# Patient Record
Sex: Female | Born: 1995 | Race: White | Hispanic: No | Smoking: Former smoker
Health system: Southern US, Community
[De-identification: ages and names within clinical notes are randomized; demographics above are authoritative.]

## PROBLEM LIST (undated history)

## (undated) DIAGNOSIS — F419 Anxiety disorder, unspecified: Secondary | ICD-10-CM

## (undated) DIAGNOSIS — Z789 Other specified health status: Secondary | ICD-10-CM

## (undated) DIAGNOSIS — G932 Benign intracranial hypertension: Secondary | ICD-10-CM

## (undated) HISTORY — DX: Other specified health status: Z78.9

## (undated) HISTORY — DX: Benign intracranial hypertension: G93.2

---
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
## 2013-03-09 ENCOUNTER — Emergency Department: Payer: Self-pay | Admitting: Emergency Medicine

## 2013-03-09 LAB — COMPREHENSIVE METABOLIC PANEL
Albumin: 3.8 g/dL (ref 3.8–5.6)
Anion Gap: 5 — ABNORMAL LOW (ref 7–16)
BUN: 10 mg/dL (ref 9–21)
Bilirubin,Total: 0.4 mg/dL (ref 0.2–1.0)
Calcium, Total: 8.9 mg/dL — ABNORMAL LOW (ref 9.0–10.7)
Co2: 27 mmol/L — ABNORMAL HIGH (ref 16–25)
Creatinine: 0.66 mg/dL (ref 0.60–1.30)
EGFR (Non-African Amer.): >60
Potassium: 3.9 mmol/L (ref 3.3–4.7)
SGOT(AST): 25 U/L (ref 0–26)
SGPT (ALT): 31 U/L (ref 12–78)
Sodium: 139 mmol/L (ref 132–141)

## 2013-03-09 LAB — CBC
HCT: 38.5 % (ref 35.0–47.0)
HGB: 13.3 g/dL (ref 12.0–16.0)
MCHC: 34.5 g/dL (ref 32.0–36.0)
Platelet: 220 10*3/uL (ref 150–440)
RBC: 5 10*6/uL (ref 3.80–5.20)

## 2013-03-09 LAB — URINALYSIS, COMPLETE
Bilirubin,UR: NEGATIVE
Glucose,UR: NEGATIVE mg/dL (ref 0–75)
Nitrite: NEGATIVE
Ph: 6 (ref 4.5–8.0)
Protein: 100
RBC,UR: 108 /HPF (ref 0–5)
Specific Gravity: 1.02 (ref 1.003–1.030)

## 2013-03-09 LAB — PREGNANCY, URINE: Pregnancy Test, Urine: NEGATIVE m[IU]/mL

## 2013-03-11 LAB — URINE CULTURE

## 2013-03-16 ENCOUNTER — Emergency Department: Payer: Self-pay | Admitting: Emergency Medicine

## 2013-03-28 ENCOUNTER — Emergency Department: Payer: Self-pay | Admitting: Emergency Medicine

## 2014-02-17 ENCOUNTER — Emergency Department: Payer: Self-pay | Admitting: Emergency Medicine

## 2014-02-17 LAB — URINALYSIS, COMPLETE
BILIRUBIN, UR: NEGATIVE
BLOOD: NEGATIVE
Bacteria: NONE SEEN
GLUCOSE, UR: NEGATIVE mg/dL (ref 0–75)
Ketone: NEGATIVE
Leukocyte Esterase: NEGATIVE
NITRITE: NEGATIVE
Ph: 5 (ref 4.5–8.0)
Protein: 30
SPECIFIC GRAVITY: 1.032 (ref 1.003–1.030)
Squamous Epithelial: 6
WBC UR: 2 /HPF (ref 0–5)

## 2014-02-17 LAB — CBC
HCT: 41.1 % (ref 35.0–47.0)
HGB: 13.8 g/dL (ref 12.0–16.0)
MCH: 27.4 pg (ref 26.0–34.0)
MCHC: 33.6 g/dL (ref 32.0–36.0)
MCV: 81 fL (ref 80–100)
PLATELETS: 168 10*3/uL (ref 150–440)
RBC: 5.04 10*6/uL (ref 3.80–5.20)
RDW: 13.5 % (ref 11.5–14.5)
WBC: 11.9 10*3/uL — AB (ref 3.6–11.0)

## 2014-02-17 LAB — COMPREHENSIVE METABOLIC PANEL
ALK PHOS: 74 U/L
ALT: 13 U/L (ref 12–78)
ANION GAP: 11 (ref 7–16)
Albumin: 3.4 g/dL — ABNORMAL LOW (ref 3.8–5.6)
BUN: 5 mg/dL — ABNORMAL LOW (ref 7–18)
Bilirubin,Total: 0.3 mg/dL (ref 0.2–1.0)
CALCIUM: 9 mg/dL (ref 9.0–10.7)
CHLORIDE: 104 mmol/L (ref 98–107)
CO2: 20 mmol/L — AB (ref 21–32)
Creatinine: 0.57 mg/dL — ABNORMAL LOW (ref 0.60–1.30)
EGFR (African American): >60
EGFR (Non-African Amer.): >60
Glucose: 66 mg/dL (ref 65–99)
OSMOLALITY: 266 (ref 275–301)
Potassium: 3.7 mmol/L (ref 3.5–5.1)
SGOT(AST): 21 U/L (ref 0–26)
Sodium: 135 mmol/L — ABNORMAL LOW (ref 136–145)
Total Protein: 7.6 g/dL (ref 6.4–8.6)

## 2014-02-17 LAB — HCG, QUANTITATIVE, PREGNANCY: Beta Hcg, Quant.: 113817 m[IU]/mL — ABNORMAL HIGH

## 2014-02-18 LAB — WET PREP, GENITAL

## 2014-02-18 LAB — GC/CHLAMYDIA PROBE AMP

## 2014-04-11 ENCOUNTER — Emergency Department: Payer: Self-pay | Admitting: Student

## 2014-04-11 LAB — URINALYSIS, COMPLETE
Bacteria: NONE SEEN
Bilirubin,UR: NEGATIVE
Blood: NEGATIVE
Glucose,UR: NEGATIVE mg/dL (ref 0–75)
KETONE: NEGATIVE
LEUKOCYTE ESTERASE: NEGATIVE
Nitrite: NEGATIVE
PH: 7 (ref 4.5–8.0)
Protein: NEGATIVE
RBC,UR: <1 /HPF (ref 0–5)
SPECIFIC GRAVITY: 1.024 (ref 1.003–1.030)
Squamous Epithelial: 4
WBC UR: <1 /HPF (ref 0–5)

## 2014-04-11 LAB — LIPASE, BLOOD: Lipase: 69 U/L — ABNORMAL LOW (ref 73–393)

## 2014-04-11 LAB — CBC WITH DIFFERENTIAL/PLATELET
BASOS PCT: 0.2 %
Basophil #: 0 10*3/uL (ref 0.0–0.1)
EOS PCT: 1.3 %
Eosinophil #: 0.2 10*3/uL (ref 0.0–0.7)
HCT: 38.9 % (ref 35.0–47.0)
HGB: 13.2 g/dL (ref 12.0–16.0)
LYMPHS ABS: 1.1 10*3/uL (ref 1.0–3.6)
Lymphocyte %: 8 %
MCH: 28.6 pg (ref 26.0–34.0)
MCHC: 34.1 g/dL (ref 32.0–36.0)
MCV: 84 fL (ref 80–100)
MONO ABS: 0.7 x10 3/mm (ref 0.2–0.9)
MONOS PCT: 5.1 %
Neutrophil #: 12.2 10*3/uL — ABNORMAL HIGH (ref 1.4–6.5)
Neutrophil %: 85.4 %
Platelet: 156 10*3/uL (ref 150–440)
RBC: 4.64 10*6/uL (ref 3.80–5.20)
RDW: 13.5 % (ref 11.5–14.5)
WBC: 14.2 10*3/uL — AB (ref 3.6–11.0)

## 2014-04-11 LAB — COMPREHENSIVE METABOLIC PANEL
ANION GAP: 8 (ref 7–16)
Albumin: 2.6 g/dL — ABNORMAL LOW (ref 3.4–5.0)
Alkaline Phosphatase: 82 U/L
BUN: 5 mg/dL — AB (ref 7–18)
Bilirubin,Total: 0.4 mg/dL (ref 0.2–1.0)
CALCIUM: 8.6 mg/dL (ref 8.5–10.1)
CREATININE: 0.54 mg/dL — AB (ref 0.60–1.30)
Chloride: 106 mmol/L (ref 98–107)
Co2: 23 mmol/L (ref 21–32)
EGFR (Non-African Amer.): >60
GLUCOSE: 87 mg/dL (ref 65–99)
OSMOLALITY: 270 (ref 275–301)
POTASSIUM: 3.7 mmol/L (ref 3.5–5.1)
SGOT(AST): 17 U/L (ref 15–37)
SGPT (ALT): 10 U/L — ABNORMAL LOW
SODIUM: 137 mmol/L (ref 136–145)
TOTAL PROTEIN: 7 g/dL (ref 6.4–8.2)

## 2014-04-11 LAB — HCG, QUANTITATIVE, PREGNANCY: BETA HCG, QUANT.: 20441 m[IU]/mL — AB

## 2014-06-17 ENCOUNTER — Observation Stay: Payer: Self-pay | Admitting: Obstetrics and Gynecology

## 2014-06-17 LAB — PRENATAL PANEL
ABO/RH(D): O POS
Antibody Screen: NEGATIVE
GLUCOSE: 114 mg/dL — AB (ref 65–99)
HCT: 33.2 % — ABNORMAL LOW (ref 35.0–47.0)
HGB: 11.2 g/dL — AB (ref 12.0–16.0)
MCH: 28.7 pg (ref 26.0–34.0)
MCHC: 33.9 g/dL (ref 32.0–36.0)
MCV: 85 fL (ref 80–100)
Platelet: 166 10*3/uL (ref 150–440)
RBC: 3.91 10*6/uL (ref 3.80–5.20)
RDW: 13.1 % (ref 11.5–14.5)
WBC: 15.5 10*3/uL — AB (ref 3.6–11.0)

## 2014-06-17 LAB — URINALYSIS, COMPLETE
Bilirubin,UR: NEGATIVE
Blood: NEGATIVE
Glucose,UR: NEGATIVE mg/dL (ref 0–75)
KETONE: NEGATIVE
NITRITE: NEGATIVE
Ph: 5 (ref 4.5–8.0)
Protein: 100
Specific Gravity: 1.024 (ref 1.003–1.030)
Squamous Epithelial: 3
WBC UR: 74 /HPF (ref 0–5)

## 2014-06-19 LAB — URINE CULTURE

## 2014-07-05 ENCOUNTER — Encounter: Payer: Self-pay | Admitting: Maternal & Fetal Medicine

## 2014-07-16 ENCOUNTER — Encounter: Payer: Self-pay | Admitting: Obstetrics and Gynecology

## 2014-08-20 ENCOUNTER — Observation Stay: Payer: Self-pay | Admitting: Obstetrics and Gynecology

## 2014-08-28 ENCOUNTER — Observation Stay: Payer: Self-pay | Admitting: Obstetrics and Gynecology

## 2014-08-29 ENCOUNTER — Observation Stay: Payer: Self-pay | Admitting: Obstetrics and Gynecology

## 2014-09-04 ENCOUNTER — Observation Stay: Payer: Self-pay | Admitting: Obstetrics and Gynecology

## 2014-09-14 ENCOUNTER — Observation Stay: Payer: Self-pay | Admitting: Obstetrics and Gynecology

## 2014-09-15 ENCOUNTER — Inpatient Hospital Stay: Payer: Self-pay | Admitting: Obstetrics and Gynecology

## 2014-09-15 LAB — CBC WITH DIFFERENTIAL/PLATELET
Basophil #: 0 10*3/uL (ref 0.0–0.1)
Basophil %: 0.2 %
EOS PCT: 0.5 %
Eosinophil #: 0.1 10*3/uL (ref 0.0–0.7)
HCT: 33 % — ABNORMAL LOW (ref 35.0–47.0)
HGB: 10.8 g/dL — ABNORMAL LOW (ref 12.0–16.0)
LYMPHS ABS: 1.6 10*3/uL (ref 1.0–3.6)
Lymphocyte %: 11.4 %
MCH: 25.6 pg — AB (ref 26.0–34.0)
MCHC: 32.8 g/dL (ref 32.0–36.0)
MCV: 78 fL — AB (ref 80–100)
MONO ABS: 0.9 x10 3/mm (ref 0.2–0.9)
Monocyte %: 6.5 %
Neutrophil #: 11.4 10*3/uL — ABNORMAL HIGH (ref 1.4–6.5)
Neutrophil %: 81.4 %
Platelet: 177 10*3/uL (ref 150–440)
RBC: 4.21 10*6/uL (ref 3.80–5.20)
RDW: 14.6 % — ABNORMAL HIGH (ref 11.5–14.5)
WBC: 14.1 10*3/uL — ABNORMAL HIGH (ref 3.6–11.0)

## 2014-09-16 LAB — TROPONIN I: Troponin-I: <0.02 ng/mL

## 2014-09-16 LAB — CBC WITH DIFFERENTIAL/PLATELET
Basophil #: 0 10*3/uL (ref 0.0–0.1)
Basophil %: 0.2 %
EOS ABS: 0 10*3/uL (ref 0.0–0.7)
Eosinophil %: 0.2 %
HCT: 30.9 % — ABNORMAL LOW (ref 35.0–47.0)
HGB: 10.3 g/dL — ABNORMAL LOW (ref 12.0–16.0)
Lymphocyte #: 1.5 10*3/uL (ref 1.0–3.6)
Lymphocyte %: 11.1 %
MCH: 26 pg (ref 26.0–34.0)
MCHC: 33.2 g/dL (ref 32.0–36.0)
MCV: 78 fL — AB (ref 80–100)
MONO ABS: 1.2 x10 3/mm — AB (ref 0.2–0.9)
Monocyte %: 8.7 %
NEUTROS PCT: 79.8 %
Neutrophil #: 10.7 10*3/uL — ABNORMAL HIGH (ref 1.4–6.5)
PLATELETS: 149 10*3/uL — AB (ref 150–440)
RBC: 3.94 10*6/uL (ref 3.80–5.20)
RDW: 14.5 % (ref 11.5–14.5)
WBC: 13.5 10*3/uL — ABNORMAL HIGH (ref 3.6–11.0)

## 2014-09-16 LAB — BASIC METABOLIC PANEL
ANION GAP: 11 (ref 7–16)
BUN: 6 mg/dL — ABNORMAL LOW (ref 7–18)
CALCIUM: 8.2 mg/dL — AB (ref 8.5–10.1)
CHLORIDE: 109 mmol/L — AB (ref 98–107)
CO2: 22 mmol/L (ref 21–32)
Creatinine: 0.6 mg/dL (ref 0.60–1.30)
EGFR (African American): >60
EGFR (Non-African Amer.): >60
Glucose: 113 mg/dL — ABNORMAL HIGH (ref 65–99)
OSMOLALITY: 282 (ref 275–301)
Potassium: 3.7 mmol/L (ref 3.5–5.1)
Sodium: 142 mmol/L (ref 136–145)

## 2014-09-16 LAB — CK TOTAL AND CKMB (NOT AT ARMC)
CK, TOTAL: 90 U/L (ref 26–192)
CK, TOTAL: 75 U/L (ref 26–192)
CK-MB: 2.2 ng/mL (ref 0.5–3.6)
CK-MB: 2.4 ng/mL (ref 0.5–3.6)

## 2014-09-16 LAB — HEMATOCRIT: HCT: 29.5 % — ABNORMAL LOW (ref 35.0–47.0)

## 2014-11-09 ENCOUNTER — Emergency Department: Payer: Self-pay | Admitting: Emergency Medicine

## 2014-12-18 NOTE — H&P (Signed)
PATIENT NAME:  Terri [name], CARTAYA MR#:  [id number] DATE OF BIRTH:  June 26, 1996  DATE OF ADMISSION:  07/08/2012  REFERRING PHYSICIAN: ER physician, Dr. Mayford Knife. PRIMARY CARE PHYSICIAN: None. OB: Dr. Darden Dates in [place].   CHIEF COMPLAINT: Left-sided flank pain, back pain.  HISTORY OF PRESENT ILLNESS: The patient is an 19 year old female with no past medical history who is 16 weeks 5 days pregnant. She has just moved to [place] from [place]. She reported some cramping in her abdomen and left-sided back/flank pain and came to the ER for evaluation. She denies any dysuria but she reports that she is not peeing very well and is having urinary frequency. She denies any fever. On further evaluation she was found to have possible pyelonephritis and urinary tract infection with pyuria. She is being admitted for further management.   ALLERGIES: No known drug allergies.   MEDICATIONS: Prenatal vitamins.   PAST MEDICAL HISTORY: None.   PAST SURGICAL HISTORY: None.   FAMILY HISTORY: Mother has arthritis, anxiety. Patient does not know of any medical problems; patient does not know her father well.    SOCIAL HISTORY: She lives with her boyfriend. Denies any history of smoking, alcohol, or drugs. She just moved last week from [place] to [place].   REVIEW OF SYSTEMS. CONSTITUTIONAL: Denies any fever, fatigue, weakness. EYES: Denies any blurred or double vision. ENT: Denies any tinnitus, ear pain. RESPIRATORY: Denies any cough, wheezing.  CARDIOVASCULAR: Denies any chest pain or palpitations.  GI: Denies any nausea, vomiting, diarrhea, abdominal pain. GU: Reports frequency and left-sided flank pain. ENDOCRINE: Denies any polyuria or nocturia. HEME/LYMPH: Denies any anemia or easy bruisability.  INTEGUMENTARY: Denies any acne or rash. MUSCULOSKELETAL: Denies any swelling, gout. NEUROLOGICAL: Denies any numbness or weakness. PSYCHIATRIC: Denies any anxiety or depression.    PHYSICAL EXAMINATION:  VITAL SIGNS: Temperature 98.2, heart rate 101, respiratory rate 24, blood pressure 159/91, pulse ox 100% on room air.   GENERAL: The patient is a young Caucasian female lying comfortably in bed, not in acute distress.   HEAD: Atraumatic, normocephalic.   EYES: No pallor, icterus, or cyanosis. PERLA, EOMI.    ENT: Wet mucous membranes. No oropharyngeal erythema or thrush.   NECK: Supple. No masses, no JVD, no thyromegaly.   CHEST: No tenderness to palpation. Not using accessory muscles of respiration. No intercostal muscle retractions.   LUNGS: Bilaterally clear to auscultation. No wheezing, rales or rhonchi.   CARDIOVASCULAR: S1, S2 regular. No murmur, rubs, or gallops.   ABDOMEN: Soft, nondistended, nondistended. No guarding or rigidity. Normal bowel sounds. The patient has some left-sided flank tenderness. She also reports some right-sided tenderness on deep palpation.   SKIN: Patient has some acne. No rashes or lesions.   PERIPHERIES: No pedal edema, 2+ pedal pulses.   MUSCULOSKELETAL: No cyanosis or clubbing.   NEUROLOGICAL: Awake, alert, oriented x3. Nonfocal neurological exam. Cranial nerves grossly intact.   PSYCH: Normal mood and affect.   RESULTS: Renal ultrasound shows normal renal ultrasound. OB ultrasound showed single live intrauterine pregnancy 16 weeks 5 days. Cervix was abnormally long at 2.6. Urinalysis shows evidence of infection with pyuria. White count of 17.1, hemoglobin 13.4, hematocrit 38.1, platelet count 183,000. Glucose 80, BUN 2, creatinine 0.37, sodium 137, potassium 3.7, chloride 104, CO2 of 23, calcium 9, bilirubin 0.5, ALP 97, ALT 12, AST 9.   ASSESSMENT AND PLAN: An 19 year old female with no past medical history who is [redacted] weeks pregnant and just moved to [place]  who comes with left-sided flank pain, back pain, and difficulty urinating.  1. Urinary tract infection/pyelonephritis. The patient has pyuria with evidence  of urinary tract infection. Her kidney ultrasound is normal. We will admit the patient to the hospital, start her on IV fluids, obtain blood and urine cultures, and give her IV Rocephin.  2. Leukocytosis possibly due to above. We will repeat a CBC in the a.m.  3. Sixteen weeks 5 days intrauterine pregnancy. The OB ultrasound confirms single live intrauterine pregnancy. The patient's [place] is in [place].  [place] [facility]. We will obtain OB consult since [facility] .  Discussed with the ED physician, discussed with the patient the plan of care and management.   TIME SPENT: 75 minutes.     ____________________________ Darrick MeigsSangeeta Bertel Venard, MD sp:vtd D: 07/08/2012 19:58:42 ET T: 07/09/2012 06:10:26 ET JOB#: [id number]  cc: Darrick MeigsSangeeta Abbagale Goguen, MD, <Dictator> Darrick MeigsSANGEETA Adilenne Ashworth MD ELECTRONICALLY SIGNED 07/12/2012 14:37

## 2014-12-18 NOTE — Discharge Summary (Signed)
PATIENT NAME:  Terri [name], Terri [name] MR#:  [id number] DATE OF BIRTH:  Mar 25, 1996  DATE OF ADMISSION:  07/08/2012 DATE OF DISCHARGE:  07/10/2012  PRESENTING COMPLAINT: Left-sided flank pain and back pain.   DISCHARGE DIAGNOSES:  1. Acute left-sided pyelonephritis/urinary tract infection, Escherichia coli.    2. [redacted] weeks pregnant.   DISCHARGE MEDICATIONS:  1. Prenatal vitamins p.o. daily.  2. Macrobid one p.o. b.i.d. for five days.   FOLLOWUP: Follow up with [facility] in one week for prenatal followup.   LABORATORY DATA:  White count was 12.1, hemoglobin and hematocrit 10.8 and 30.7, platelet count 145. Blood cultures negative in 36 hours. Ultrasound kidneys bilateral: Normal. Urinalysis positive for urinary tract infection. Urine culture Escherichia coli. White count on admission was 17.1.  CONSULTANTS: OB/GYN Dr. Luella Cookosenow.    BRIEF SUMMARY OF HOSPITAL COURSE: [name] is an 19 year old Caucasian female with no past medical history who is [redacted] weeks pregnant, [place], comes in with left-sided flank pain and back pain and difficulty urinating. She was admitted with:  1. Acute left-sided pyelonephritis/urinary tract infection. She was started on IV Rocephin. Her antibiotics were changed to p.o. Macrobid, scrip was given by Dr. Luella Cookosenow.  Blood cultures were negative. Urine culture grew Escherichia coli.  2. Leukocytosis due to #1. Improved. No fever.  3. 16-week intrauterine pregnancy. OB ultrasound confirmed single live intrauterine pregnancy. The patient's [place] is in [place]. [place] [facility] [place] [facility]. [facility] in one week.  4. Hospital stay otherwise remained stable.  5. CODE STATUS: The patient remained [name].   TIME SPENT: 40 minutes.     ____________________________ [name]. Allena KatzPatel, MD sap:bjt D: 07/12/2012 00:59:05 ET T: 07/12/2012 13:08:33 ET JOB#: [id number]  cc: [name]. Allena KatzPatel, MD, <Dictator> Willow OraSONA [name]  Annali Lybrand MD ELECTRONICALLY SIGNED 07/18/2012 10:24

## 2014-12-22 NOTE — Consult Note (Signed)
Referral Information:  Reason for Referral Terri [name] returns to discuss her pregnancy and her h/o "autoimmune disease" and chronic knee pain, She also has concerns about the  birth process given problems last time   Referring Physician [facility] - Dr Valentino Saxonherry   Prenatal Hx 19 yo G2 p0101 WF at approximately [redacted] weeks gestation ( EDD of 09/09/2014 based on 10 weeks [facility] ) Pt presented late for care in mid [place].  She has a h/o "lupus" diagnosed 4-5 years ago with knee pain and positive ANA . Pt reports ongoign knee pain that bothers her especially while caring for a toddler.  She is also worried about traumatic birth - she and the baby had bradycardia in late second stage  - he had a hematoma from a vacuum delivery  and [facility] . She wants to avoid a similar experience.   Past Obstetrical Hx 2014-PROM at 7736 weeks  female vacuum assisted  vaginal delivery 5 lbs 6 oz - pt states that his FHR dropped and so did hers - she coudln't push him out and she had a vacuum assisted delivery - she had a laceration , no epidural , the infant had a clot in his head and [facility] - he is healthy now- frightening out of control experience for her   Home Medications: Medication Instructions Status  pantoprazole 20 mg oral delayed release tablet 1 tab(s) orally once a day Active  Prenatal 1 Prenatal Multivitamins with Folic Acid 1 mg oral capsule 1 cap(s) orally once a day Active   Allergies:   Zithromax: Hives  Septra: Hives  Clindamycin: Hives  Percocet 10/325: GI Distress  Vital Signs/Notes:  Nursing Vital Signs: **Vital Signs.:   16-Nov-15 13:15  Vital Signs Type Routine  Temperature Temperature (F) 98.5  Celsius 36.9  Temperature Source oral  Pulse Pulse 94  Respirations Respirations 18  Systolic BP Systolic BP 111  Diastolic BP (mmHg) Diastolic BP (mmHg) 57  Mean BP 75  Pulse Ox % Pulse Ox % 98  Pulse Ox Activity Level  At rest   Oxygen Delivery Room Air/ 21 %   Perinatal Consult:  PGyn Hx abnormal Paps  HPV pos on New ob pap   Past Medical History cont'd - - age 19 she was a healthy active teen - on the cheerleading squad in HS - one morning woke up ad couldn;t move her leg - taken to ER and saw her pediatrician - pos ANA - DX "lupus" - no rash,  no renal disease ,no pleuritis , unsure about other labs  no flare pp -  - depression .anxiety- she related at her new ob to sleeppless ness related to  her knee pain given pain meds   Soc Hx single, pt mentioned moving out as a teen on her own   Review Of Systems:  Subjective knee pain    Additional Lab/Radiology Notes ANA pos no titer,  anti DS DNA Neg  LAC neg ACL neg rpr neg RA NEG  Anti Ro neg  anti La pos 4.1 (neg <0.9)  micro albumin in urine - 9.9 neg   Impression/Recommendations:  Impression IUP at 32 weeks  1 h/o "lupus"- knee pain, pos ANA , pos anti LA only  features -  reasonable prior pregnancy outcome no pp flare , has not made appt with Rheumatologist yet- I reviewed low likelihood of neonatal lupus or congenital heart block , suggested she might consider  a baby aspirin  daily  2 Knee pain - challenging for her to do activities of daily living -care for toddler 3- h/o PROM/ late  preterm delivery of 5lb infant  4 prior  traumatic dleivery with maternal and fetal dsitress -infant with significant hematoma requiring eval at [facility] now healthy  5 abnormal pap pos HPV discussed with pt need for f/u pp briefly discussed gardasil pp 6 anxiety depression - unclear who she relies on for support - her discussion of moving out from her parents as a teen sounded traumatic 7 Candida on culture and pap   Recommendations 1 Reassured that she is at low risk of neonatal lupus suggested she hare information of Anti La with pediatiricians - refer to Rheumatology - done today to Dr Gavin Potters 2 - suggested knee elastic support and sturdy supportive shoes -  referred to PT for exercise suggestions - tylenol - ice /heat for comfort -  3 baby aspirin 81 can be used 4 - pt refuses to consider epidural - I attempted to reassure pt that most second deliveries proceed smoothly but many things in obstetrics are unpredictable - I told her cesarean would be much more traumatic for her - she would do well to discuss birth  planning with CNM to help her feel incontrol of situation. 5 Rx diflucan   Plan:  Ultrasound at what gestational ages repeat for growth at 56 -34  weeks   Antepartum Testing Weekly, Starting at 84 to 36 weeks   Delivery Mode Vaginal, pt needs coaching   Delivery at what gestational age per routien if lupus labs negative   Comment/Plan referral to phys therapy- pt has medicaid not covered beyond initial intake  referral to rheum- done- gave them another phone number for scheduling  referral to counselor/SW suggested    Total Time Spent with Patient 15 minutes   Office Use Only [id number]  Office Visit Level 3 ( ) EST exp prob focused outpt   Coding Description: MATERNAL CONDITIONS/HISTORY INDICATION(S).   Lupus/Connective Tissue Disorder.  Electronic Signatures: Rondall Allegra (MD)  (Signed [phone] 14:45)  Authored: Referral, Home Medications, Allergies, Vital Signs/Notes, Consult, Exam, Lab/Radiology Notes, Impression, Plan, Billing, Coding Description   Last Updated: 16-Nov-15 14:45 by Rondall Allegra (MD)

## 2014-12-22 NOTE — Consult Note (Signed)
Referral Information:  Reason for Referral pt with h/o autoimmune disease during pregnancy and chronic knee pain, pt with concerns about birth process   Referring Physician [facility] - Dr Valentino Saxon   Prenatal Hx 19 yo G2 p0101 WF at approximately [redacted] weeks gestation with EDD of 09/09/2014 based on 10 weeks scan at [facility] ER - pt presented late for care in mid October . She has a h/o lupus diangosed in high school see PMH and there were concerns about appropriate management. Pt reports ongoign knee pain that bothers her especially while caring for a toddler. She is also worried about birth - she and the baby had bradycardia at the end - he had a hematoma from a vacuum and required [facility] . She wants to avoid a similar experience.   Past Obstetrical Hx 2014-PROM at 42 weeks  female vacuum assisted  vaginal delivery 5 lbs 6 oz - pt states that his FHR dropped and so did hers - she coudln't push him out and she had a vacuum assisted delivery - she had a laceration , no epidural , the infant had a clot in his head and required [facility] - he is healthy now- frightening out of control experience for her   Home Medications: Medication Instructions Status  pantoprazole 20 mg oral delayed release tablet 1 tab(s) orally once a day Active  Prenatal 1 Prenatal Multivitamins with Folic Acid 1 mg oral capsule 1 cap(s) orally once a day Active   Allergies:   Zithromax: Hives  Septra: Hives  Clindamycin: Hives  Percocet 10/325: GI Distress  Vital Signs/Notes:  Nursing Vital Signs: **Vital Signs.:   05-Nov-15 13:09  Vital Signs Type Routine  Temperature Temperature (F) 98.1  Celsius 36.7  Pulse Pulse 88  Respirations Respirations 18  Systolic BP Systolic BP 117  Diastolic BP (mmHg) Diastolic BP (mmHg) 74  Mean BP 88  Pulse Ox % Pulse Ox % 97  Pulse Ox Activity Level  At rest  Oxygen Delivery Room Air/ 21 %   Perinatal Consult:  PGyn Hx abnormal Paps  HPV pos on New ob pap   Past  Medical History cont'd - - age 54 she was a healthy active teen - on the cheerleading squad in HS - one morning woke up ad couldn;t move her leg - taken to ER and saw her pediatrician - pos ANA - DX SLE - no rash,  no renal disease ,no pleuritis , unsure about other labs  no flare pp -  - depression .anxiety- she related at her new ob to sleeppless ness related to  her knee pain given pain meds   Soc Hx single, pt mentioned moving out as a teen on her own   Review Of Systems:  Subjective knee pain    Additional Lab/Radiology Notes ANA pos no titer LAC neg ACL negrpr neg Ra NEG  Anti Ro La neg  micro albumin in urine - 9.9 neg   Impression/Recommendations:  Impression IUP at 30 weeks  1 h/o "lupus"- knee pain and pos ANA only  features - doubt she meets diagnostic criteria -  reasonable prior pregnancy outcome no pp flare  2 Knee pain - challenging for her to do activities of daily living -care for toddler 3- h/o PROM/ later preterm delivery of 5lb infant  4 prior  traumatic dleivery with maternal and fetal dsitress - 5 abnormal pap pos HPV discussed with pt need for f/u pp briefly discussed gardasil pp 6 anxiety depression -  unclear who she relies on for support - her discussion of moving out from her parents as a teen sounded traumatic   Recommendations 1 will check ANA titier and anti DS DNA - refer to Rheumatology - done today to Dr [facility], pt woudl like return visit only if abnormal labs  2 - suggested knee elastic support and sturdy supportive shoes - referred to PT for exercise suggestions - tylenol - ice /heat for comfort -  3 baby aspirin 81 can be used though likely too low a dose to be analgesic and too late for preeclampsia prophylaxis  4 - pt refuses to consider epidural - I attempted to reassure pt that most second deliveries proceed smoothly but many things in obstetrics are unpredictable - I told her cesarean would be much more traumatic for her - she would do well to  discuss birth  planning with CNM to help her feel incontrol of situation. 5 Social work input regarding pt's stress -   Plan:  Ultrasound at what gestational ages repeat for growth at 5032 -34  weeks   Antepartum Testing none if neg lupus labs   Delivery Mode Vaginal, pt needs coaching   Additional Testing lupus labs ordered   Delivery at what gestational age per routien if lupus labs negative   Comment/Plan referral to phys therapy- done referral to rheum- done referral to counselor/SW suggested    Total Time Spent with Patient 30 minutes   Office Use Only [id number]  Level 2 (30min) NEW office consult exp prob focused   Coding Description: MATERNAL CONDITIONS/HISTORY INDICATION(S).   Lupus/Connective Tissue Disorder.  Electronic Signatures: Rondall AllegraLivingston, Tannon Peerson Gresham (MD)  (Signed [phone]-Nov-15 17:50)  Authored: Referral, Home Medications, Allergies, Vital Signs/Notes, Consult, Exam, Lab/Radiology Notes, Impression, Plan, Billing, Coding Description   Last Updated: 05-Nov-15 17:50 by Rondall AllegraLivingston, Jhada Risk Gresham (MD)

## 2015-01-08 NOTE — H&P (Signed)
L&D Evaluation:  History:  HPI [name] is a 19 y.o. 162P0101 single Caucasian female, LMP unsure (sometime in April), EGA 40.1 weeks, EDD 09/14/14 who presents with c/o contractions since yesterday morning, worsening today.   Presents with contractions   Patient's Medical History h/o SLE with no flares this pregnancy, h/o UTI, [facility]   Patient's Surgical History none   Medications Pre [place]atal Vitamins  T#3, Vicodin for pelvic pain   Allergies Clindamycin, Percocet, Septra, Zitrhomax   Social History none   Family History Non-Contributory   ROS:  General normal   HEENT normal   CNS normal   GI normal   GU contractions   Resp normal   CV normal   Renal normal   MS normal   Exam:  Vital Signs stable   Urine Protein not completed   General mild distress with contractions   Mental Status clear   Chest clear   Heart normal sinus rhythm, no murmur/gallop/rubs   Abdomen gravid, non-tender   Estimated Fetal Weight Average for gestational age   Fetal Position cephalic   Back no CVAT   Edema no edema   Pelvic no external lesions, 4-5/80/-1 (changed fro 2/40/-3 yesterday in triage)   Mebranes Intact   FHT normal rate with no decels   Fetal Heart Rate 150   Ucx not detectable by tocometer, but palpable   Skin dry, no lesions   Other O+/-/HIV-/ND/NR/RNI/VZI/GBS-/GC-/Cl-    RGS 122 (nml) Pap smear ASCUS HR HPV+ 09/15/14: 14.1>10.8/33.0<177   Impression:  Impression active labor   Plan:  Plan EFM/NST, monitor contractions and for cervical change   Comments Admit to labor and delivery Patient desires epidural.  Can receive. IV Stadol until epidural received. Mild anemia of pregnancy. Can treat as outpatient.  If no change after 4 hrs, can begin Pitocin for augmentation.   Electronic Signatures: Fabian Novemberherry, Darionna Banke S (MD)  (Signed 16-Jan-16 14:37)  Authored: L&D Evaluation   Last Updated: 16-Jan-16 14:37 by Fabian Novemberherry, Graycen Degan  S (MD)

## 2015-01-08 NOTE — H&P (Signed)
L&D Evaluation:  History:  HPI 19 year old G1 P0 woth EDC=12/18/12 by LMP=03/13/2012 and confirmed with a 10 wk ultrasound presents at 34 weeks with c/o sharp upper abdominal pain and nausea. She has been nauseous all day and vomited once after 1430 this afternoon. She has been able to keep down fluids since then. She did have some blood in the vomitus. The epigastric pain comes in waves and radiates to her substernal and left chest area and to the right side of her back. Rates the pain 9/10. Has had one loose stool today. Also c/o pain in her left sacroiliac area radiating down her left buttock to her lower leg which had responded to Tylenol and rest earlier today. She reports this pain has returned. She was just seen here on 6 March and for contractions and was begun on Keflex for a suspected UTI. She has not taken any Keflex today. She was also treated for a monilial vaginitis. Denies fever, unusual vaginal discharge, vulvar itching or irritation, or VB. Baby active. Has been treated this pregnancy for pyelonephritis in Nov 2013 and a UTI in Sept 2013. Does not take the prophyllaxis of macrobid that was prescribed. ROS also positive for feeling week and tired in the last few days.  [place] care [facility] [place] [facility] and remarkable for normal anatomy scan LABS: Varicella equivocal, RI, O POS   Presents with abdominal pain, back pain, left back and leg pain.   Patient's Medical History Pyelo Nov 2013, mono 2011   Patient's Surgical History Wisdom teeth extraction   Medications Pre [place]atal Vitamins  Tylenol (Acetaminophen)  Keflex 500 mgm tid, Diflucan 150 mgm x 2 doses   Allergies Sulfa, Clindamycin, Azithromycin-all cause swelling and hives.   Social History none  Former smoker   Family History Non-Contributory   ROS:  ROS see HPI   Exam:  Vital Signs stable   Urine Protein negative dipstick, +1 ketonuria   General anxious, but ion NAD   Mental  Status clear   Chest clear   Heart normal sinus rhythm, no murmur/gallop/rubs   Abdomen uterus tender in fundus when palpating fetal parts, some upper abdominal tenderness. +/- murphy's sign, BS active.   Fetal Position vtx   Edema no edema   Pelvic no external lesions, closed int os/70%/-1 to -2   Mebranes Intact   FHT normal rate with no decels, reactive NST   Ucx mild occasional   Skin dry   Impression:  Impression IUP at 34 weeks with abdominal pain. R/O GB disease.  no evidence of labor   Plan:  Plan UA, DC EFM. CBC, CMP, lipase. Demerol and Phenergan for pain and nausea. Protonix.  mefoxin. NPO for GB ultrasound in AM.   Comments LAb results:WBC=19K, H&H=11.1/32.3, lipase =52, LFTs WNL except albumin=2.3, BUN and cr WNL, lytes WNL   Electronic Signatures: Trinna BalloonGutierrez, Colleen L (CNM)  (Signed 10-Mar-14 15:38)  Authored: L&D Evaluation   Last Updated: 10-Mar-14 15:38 by Trinna BalloonGutierrez, Colleen L (CNM)

## 2015-01-08 NOTE — H&P (Signed)
L&D Evaluation:  History:  HPI 19 year old G1 P0 woth EDC=12/18/12 by LMP=03/13/2012 and confirmed with a 10 wk ultrasound presents at 1933 3/7 weeks with c/o intermittent cramping pain in her lower abdomen and lower back and uterine tightening for weeks, worse since 2200. Currently she has constant sacral back pain. She has had no dysuria, but increased urinary frequency recently. Denies fever, unusual vaginal discharge, vulvar itching or irritation, or VB. Baby active. Has been treated this pregnancy for pyelonephritis in Nov 2013 and a UTI in Sept 2013. Does not take the prophyllaxis of macrobid that was prescribed. ROS also positive for feeling week and tired in the last few days.  Prenatal care transferred from [place] [facility] in December 2013 and remarkable for normal anatomy scan LABS: Varicella equivocal, RI, O POS   Presents with abdominal pain, back pain, contractions   Patient's Medical History Pyelo Nov 2013, mono 2011   Patient's Surgical History Wisdom teeth extraction   Medications Pre [place]atal Vitamins   Allergies Sulfa, Clindamycin, Azithromycin-all cause swelling and hives.   Social History none  Former smoker   Family History Non-Contributory   ROS:  ROS see HPi   Exam:  Vital Signs 128/79   Urine Protein cloudy yelow, 1.009, 3+ leuks, 13 RBC, 27 WBC, +1 bacteria, 5 epi cells   General no apparent distress   Mental Status clear   Chest clear   Heart normal sinus rhythm, no murmur/gallop/rubs   Abdomen tenderness suprapubically, uterus NT otherwise   Estimated Fetal Weight Average for gestational age, 4#14 oz EFW by US.   Fetal Position cephalic on ultrasound   Edema no edema   Pelvic ext os 1 cm, internal os closed/50%/-1. green mucoid discharge on  glove. wet prep: positive for  hyphae, neg clue cells or trich.   Mebranes Intact   FHT normal rate with no decels, baseline 130s with accels to 160s   Ucx initially 3 ctxs in 30 min, spaced out to  2 mild ctxs in 30 min.   Skin dry   Impression:  Impression IUP at 33 3/7 weeks with UTI and monilial  vulvovaginitis   Plan:  Plan urine culture pending. Start Keflex 500 mgm q8 x1 week and Diflucan 150 mgm x2 doses 3-4 days apart. Followup Mon or Tuesday. in office. Increase non caffeine fluids.   Electronic Signatures: Trinna BalloonGutierrez, Colleen L (CNM)  (Signed 06-Mar-14 04:04)  Authored: L&D Evaluation   Last Updated: 06-Mar-14 04:04 by Trinna BalloonGutierrez, Colleen L (CNM)

## 2015-01-08 NOTE — H&P (Signed)
L&D Evaluation:  History:  HPI 19 year old G1 P0 woth EDC=12/18/12 by LMP=03/13/2012 and confirmed with a 10 wk ultrasound presents at 4037 3/7 weeks with c/o LOF since 1400 today. Having mild irregular contractions. Good FM. No VB.  Has been treated this pregnancy for pyelonephritis in Nov 2013 and a UTI in Sept 2013. Does not take the prophyllaxis of macrobid that was prescribed.  [facility]ren[facility] care [facility] in December 2013 and remarkable for normal anatomy scan LABS: Varicella equivocal, RI, O POS, GBS negative.   Presents with contractions, leaking fluid   Patient's Medical History Pyelo Nov 2013, mono 2011   Patient's Surgical History Wisdom teeth extraction   Medications not taking PNV   Allergies Sulfa, Clindamycin, Azithromycin-all cause swelling and hives.   Social History none  Former smoker   Family History Non-Contributory   ROS:  ROS see HPI   Exam:  Vital Signs 128/81    Urine Protein not completed   General no apparent distress   Mental Status clear   Chest clear   Heart normal sinus rhythm, no murmur/gallop/rubs   Abdomen gravid, non-tender   Estimated Fetal Weight Average for gestational age   Fetal Position cephalic   Reflexes 2+   Pelvic no external lesions, SSE: positive Nitrazine, positive fern, scant clear fluid in vagina. cx 1+/50%/-1   Mebranes Ruptured   Description clear   FHT normal rate with no decels, 130 with accels to 150s to 160   FHT Description CAT1   Fetal Heart Rate 135    Ucx q3-5 min   Skin dry   Impression:  Impression IUP at 37 3/7 weeks with PROM x8 hours   Plan:  Plan EFM/NST, monitor contractions and for cervical change, Pitocin induction discussed with patient and she agrees with POM after explaining risks of hyperstimulation, FITL, C-section, FTP. Stadol for pain. Epidural when appropriate if patient desires.   Electronic Signatures: Trinna BalloonGutierrez, Yaneliz Radebaugh L (CNM)  (Signed  03-Apr-14 04:48)  Authored: L&D Evaluation   Last Updated: 03-Apr-14 04:48 by Trinna BalloonGutierrez, Cashmere Dingley L (CNM)

## 2015-01-08 NOTE — H&P (Signed)
L&D Evaluation:  History Expanded:   HPI 19 yo G1, presents to L&D at 24 weeks with c/o right flank pain that became severe this am. Records not available at this time. History notable for admission in early November for Pyelonephritis.    Gravida 1    Presents with back pain    Patient's Medical History No Chronic Illness    Patient's Surgical History dental surgery    Medications Pre Natal Vitamins    Allergies Clinda, Azythro, Sulfa - rash with each    Social History none    Family History Non-Contributory   Exam:   Vital Signs stable    Urine Protein UA: blood 3+, protein 100, Leuk 2+, RBC 214, WBC 33, trace bacteria, 4 epithelial    General no apparent distress    Mental Status clear    Chest clear    Heart normal sinus rhythm    Abdomen gravid, some tenderness, soft to palpation    Back no CVAT, + right CVAT    Edema no edema    Pelvic no external lesions, cervix closed and thick    Mebranes Intact    FHT +FHTs    Ucx absent    Skin dry   Impression:   Impression IUP at 24 weeks with severe right flank pain   Plan:   Comments IV fluids, Cefoxitin, Morphine 4 mg IV for pain Renal US to r/o nephrolithiasis or hydronephritis Urine culture for sensitivity   Electronic Signatures: Deniesha Stenglein, Marta Lamasamara K (CNM)  (Signed 29-Dec-13 07:07)  Authored: L&D Evaluation   Last Updated: 29-Dec-13 07:07 by Vella KohlerBrothers, Mykle Pascua K (CNM)

## 2015-01-08 NOTE — H&P (Signed)
L&D Evaluation:  History Expanded:   HPI 19 yo G1 whose EDC - 03/13/12.  [place] recently moved [place].  Presents with one day Hx of left flank pain, icreased with urination.    Gravida 1    Presents with back pain    Patient's Medical History Mono in the past    Patient's Surgical History none    Medications Pre [place]atal Vitamins    Allergies Clinda, Azythro, Sulfa - rash with each    Social History none    Family History Non-Contributory   Exam:   Vital Signs stable    General no apparent distress    Mental Status clear    Chest clear    Heart normal sinus rhythm    Abdomen gravid, non-tender    Estimated Fetal Weight Average for gestational age    Back CVAT    Edema no edema    Mebranes Intact    FHT normal rate with no decels   Impression:   Impression IUP, [redacted]W[redacted]D   Plan:   Plan continue antibiotics as ordered, probable disc in am if remains afeb   Electronic Signatures: Towana Badgerosenow, Philip J (MD)  (Signed [phone]-Nov-13 09:49)  Authored: L&D Evaluation   Last Updated: 09-Nov-13 09:49 by Towana Badgerosenow, Philip J (MD)

## 2015-11-30 ENCOUNTER — Emergency Department
Admission: EM | Admit: 2015-11-30 | Discharge: 2015-11-30 | Disposition: A | Discharge status: Home / Self Care | Payer: Medicaid Other | Attending: Emergency Medicine | Admitting: Emergency Medicine

## 2015-11-30 ENCOUNTER — Emergency Department: Payer: Medicaid Other

## 2015-11-30 ENCOUNTER — Encounter: Payer: Self-pay | Admitting: Emergency Medicine

## 2015-11-30 DIAGNOSIS — B349 Viral infection, unspecified: Secondary | ICD-10-CM | POA: Diagnosis not present

## 2015-11-30 DIAGNOSIS — R51 Headache: Secondary | ICD-10-CM | POA: Diagnosis present

## 2015-11-30 LAB — CBC
HEMATOCRIT: 39.2 % (ref 35.0–47.0)
HEMOGLOBIN: 13.6 g/dL (ref 12.0–16.0)
MCH: 27.3 pg (ref 26.0–34.0)
MCHC: 34.7 g/dL (ref 32.0–36.0)
MCV: 78.6 fL — ABNORMAL LOW (ref 80.0–100.0)
Platelets: 181 10*3/uL (ref 150–440)
RBC: 4.99 MIL/uL (ref 3.80–5.20)
RDW: 14.2 % (ref 11.5–14.5)
WBC: 7.3 10*3/uL (ref 3.6–11.0)

## 2015-11-30 LAB — BASIC METABOLIC PANEL
Anion gap: 8 (ref 5–15)
BUN: 9 mg/dL (ref 6–20)
CHLORIDE: 104 mmol/L (ref 101–111)
CO2: 22 mmol/L (ref 22–32)
CREATININE: 0.47 mg/dL (ref 0.44–1.00)
Calcium: 8.6 mg/dL — ABNORMAL LOW (ref 8.9–10.3)
GFR calc non Af Amer: >60 mL/min (ref >60)
Glucose, Bld: 98 mg/dL (ref 65–99)
POTASSIUM: 3.5 mmol/L (ref 3.5–5.1)
SODIUM: 134 mmol/L — AB (ref 135–145)

## 2015-11-30 LAB — POCT PREGNANCY, URINE: PREG TEST UR: NEGATIVE

## 2015-11-30 LAB — RAPID INFLUENZA A&B ANTIGENS: Influenza B (ARMC): NEGATIVE

## 2015-11-30 LAB — RAPID INFLUENZA A&B ANTIGENS (ARMC ONLY): INFLUENZA A (ARMC): NEGATIVE

## 2015-11-30 MED ORDER — ACETAMINOPHEN 500 MG PO TABS
1000.0000 mg | ORAL_TABLET | Freq: Once | ORAL | Status: AC
  Administered 2015-11-30: 1000 mg via ORAL
  Filled 2015-11-30: qty 2

## 2015-11-30 MED ORDER — KETOROLAC TROMETHAMINE 30 MG/ML IJ SOLN
30.0000 mg | Freq: Once | INTRAMUSCULAR | Status: AC
  Administered 2015-11-30: 30 mg via INTRAMUSCULAR
  Filled 2015-11-30: qty 1

## 2015-11-30 NOTE — ED Notes (Signed)
Headache, nausea, vomiting x 2 days - vomited last at 3:30pm.

## 2015-11-30 NOTE — ED Provider Notes (Signed)
[facility] Emergency Department Provider Note  ____________________________________________    I have reviewed the triage vital signs and the nursing notes.   HISTORY  Chief Complaint Headache    HPI Terri [name] is a 20 y.o. female who presents with complaints of myalgias, headache, nausea and fatigue all of which started yesterday afternoon. She does report mild neck pain but has full range of motion without discomfort. No recent travel. No abdominal pain. She has not taken anything for the pain.     History reviewed. No pertinent past medical history.  There are no active problems to display for this patient.   History reviewed. No pertinent past surgical history.  No current outpatient prescriptions on file.  Allergies Zithromax  History reviewed. No pertinent family history.  Social History Social History  Substance Use Topics  . Smoking status: Never Smoker   . Smokeless tobacco: None  . Alcohol Use: No    Review of Systems  Constitutional: Positive for chills Eyes: Negative for redness ENT: Negative for sore throat Cardiovascular: Negative for chest pain Respiratory: Mild cough Gastrointestinal: Negative for abdominal pain, positive for nausea Genitourinary: Negative for dysuria. Musculoskeletal: Negative for back pain. Skin: Negative for rash. Neurological: Negative for focal weakness, positive for headache Psychiatric: no anxiety    ____________________________________________   PHYSICAL EXAM:  VITAL SIGNS: ED Triage Vitals  Enc Vitals Group     BP 11/30/15 1918 150/82 mmHg     Pulse Rate 11/30/15 1918 112     Resp 11/30/15 1918 18     Temp 11/30/15 1918 100 F (37.8 C)     Temp Source 11/30/15 1918 Oral     SpO2 11/30/15 1918 97 %     Weight 11/30/15 1918 210 lb (95.255 kg)     Height 11/30/15 1918  (1.6 m)     Head Cir --      Peak Flow --      Pain Score 11/30/15 1918 10     Pain Loc --     Pain Edu? --      Excl. in GC? --      Constitutional: Alert and oriented. No acute distress Eyes: Conjunctivae are normal. No erythema or injection ENT   Head: Normocephalic and atraumatic.   Mouth/Throat: Mucous membranes are moist. Cardiovascular: Normal rate, regular rhythm. Normal and symmetric distal pulses are present in the upper extremities.  Respiratory: Normal respiratory effort without tachypnea nor retractions. Breath sounds are clear and equal bilaterally.  Gastrointestinal: Soft and non-tender in all quadrants. No distention. There is no CVA tenderness. Genitourinary: deferred Musculoskeletal: Nontender with normal range of motion in all extremities. No meningismus Neurologic:  Normal speech and language. No gross focal neurologic deficits are appreciated. Skin:  Skin is warm, dry and intact. No rash noted. Psychiatric: Mood and affect are normal. Patient exhibits appropriate insight and judgment.  ____________________________________________    LABS (pertinent positives/negatives)  Labs Reviewed  CBC - Abnormal; Notable for the following:    MCV 78.6 (*)    All other components within normal limits  BASIC METABOLIC PANEL - Abnormal; Notable for the following:    Sodium 134 (*)    Calcium 8.6 (*)    All other components within normal limits  RAPID INFLUENZA A&B ANTIGENS ([facility] ONLY)  POC URINE PREG, ED  POCT PREGNANCY, URINE    ____________________________________________   EKG  None  ____________________________________________    RADIOLOGY  CT head unremarkable  ____________________________________________   PROCEDURES  Procedure(s) performed: none  Critical Care performed: none  ____________________________________________   INITIAL IMPRESSION / ASSESSMENT AND PLAN / ED COURSE  Pertinent labs & imaging results that were available during my care of the patient were reviewed by me and considered in my medical decision making (see  chart for details).  Patient presents with likely viral syndrome. She has myalgias, chills, headache, fatigue and nausea. She has no meningismus on exam. Overall she is well-appearing and her presentation is  not consistent with bacterial meningitis. I did recommend a lumbar puncture though given her description of neck discomfort and the patient refused. I described in detail the importance of lumbar puncture in diagnosing meningitis even though the risk is small and she understands this and does not want it done. Rather she requested pain medication for her headache, so we gave Tylenol and Toradol. After 20 minutes she reported significant improvement in her headache and wants to "to go home and sleep it off".   Return precautions given to patient.  ____________________________________________   FINAL CLINICAL IMPRESSION(S) / ED DIAGNOSES  Final diagnoses:  Acute viral syndrome          Jene Everyobert Aryah Doering, MD 11/30/15 2310

## 2016-01-12 IMAGING — US US OB < 14 WEEKS
1 series · 14 of 28 positions shown · non-contrast
Comparison: CT of the abdomen and pelvis performed 03/09/2013

CLINICAL DATA: Lower abdominal cramping.

EXAM:
OBSTETRIC <14 WK ULTRASOUND
TECHNIQUE: Transabdominal ultrasound was performed for evaluation of the
gestation as well as the maternal uterus and adnexal regions.

[Series 1: us ob < 14 weeks · 0.17mm/px · 14 of 37 slices shown]
[im 2/37]
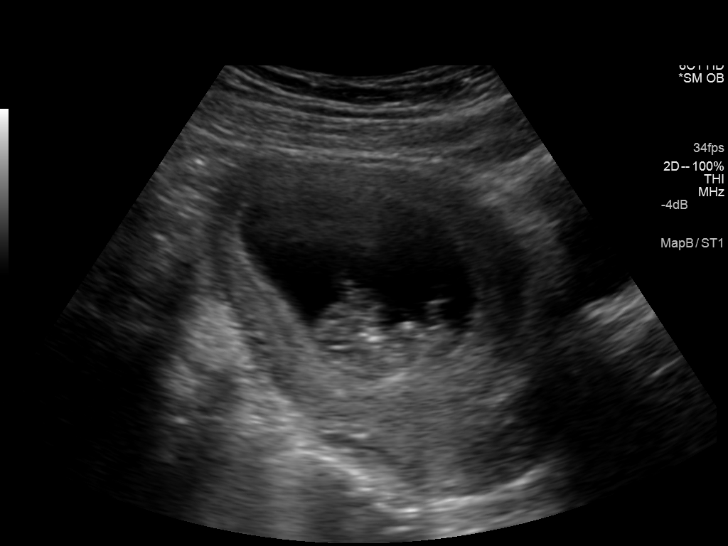
[im 5/37]
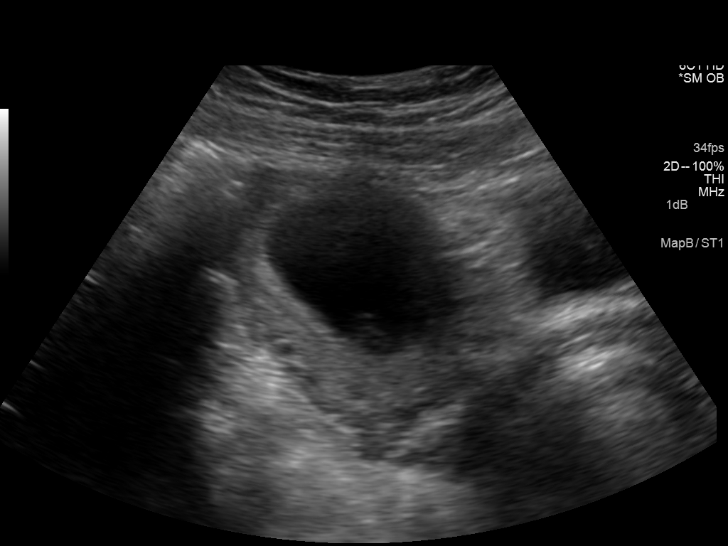
[im 7/37]
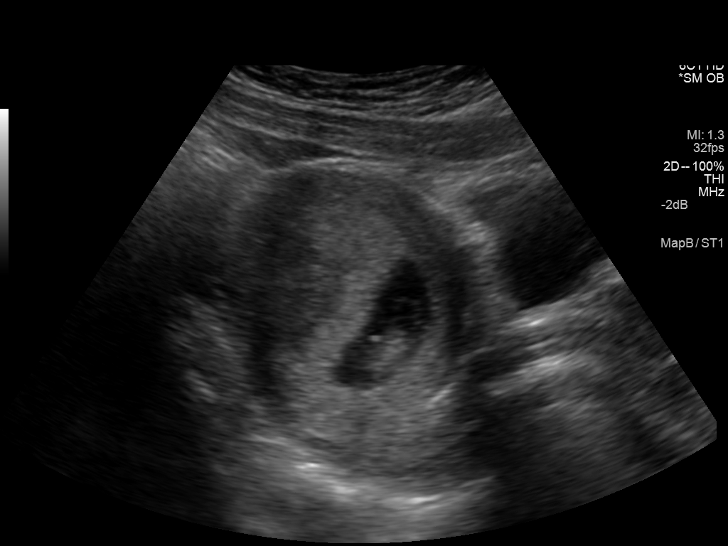
[im 10/37]
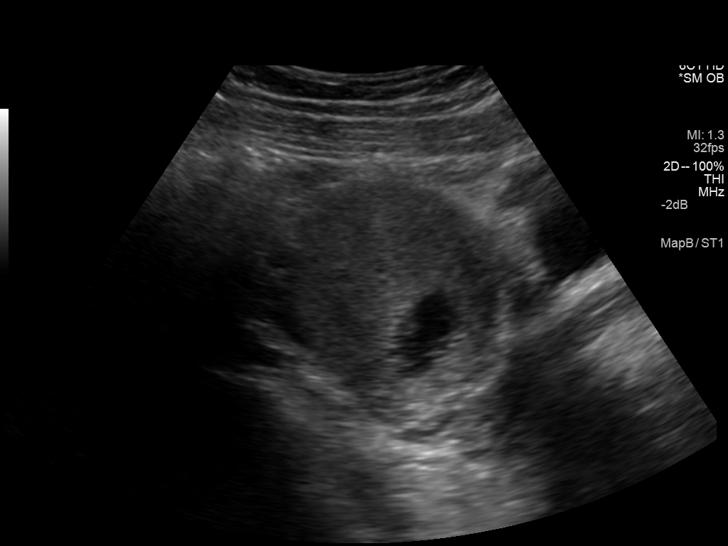
[im 13/37]
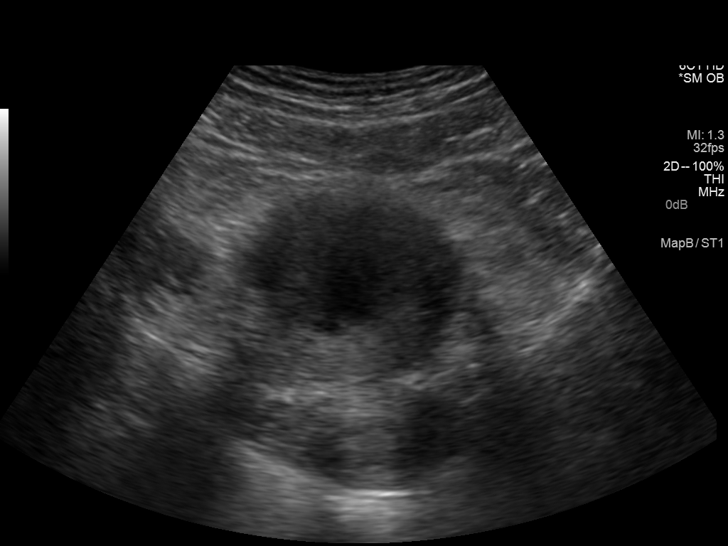
[im 15/37]
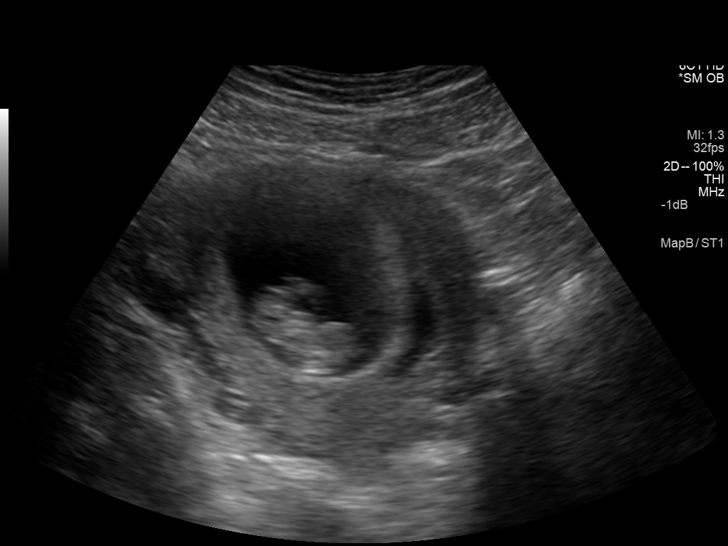
[im 18/37]
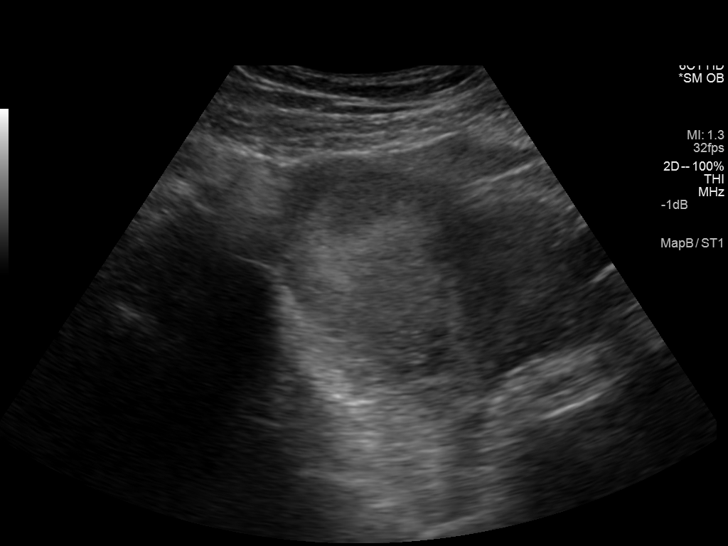
[im 21/37]
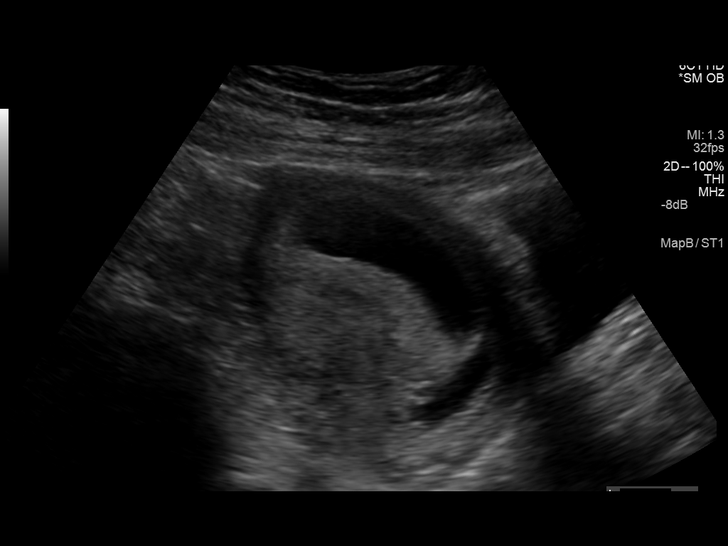
[im 23/37]
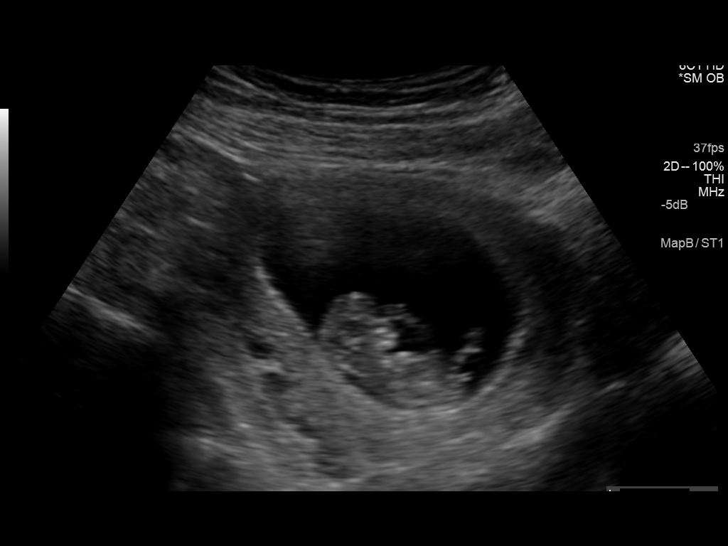
[im 26/37]
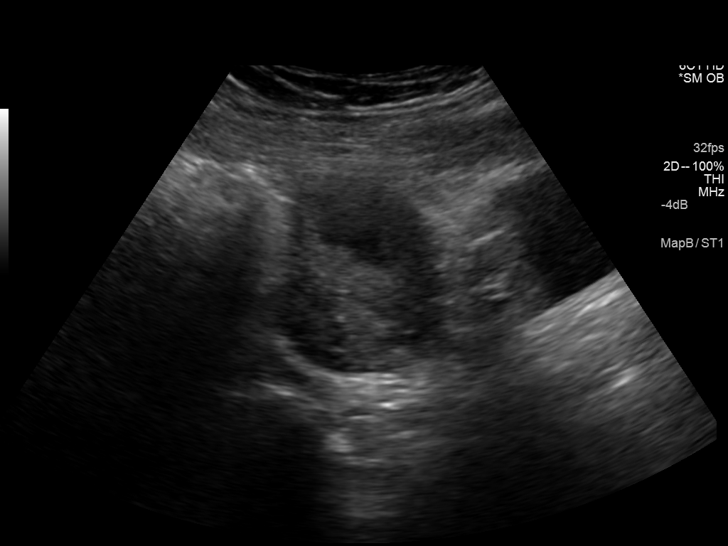
[im 29/37]
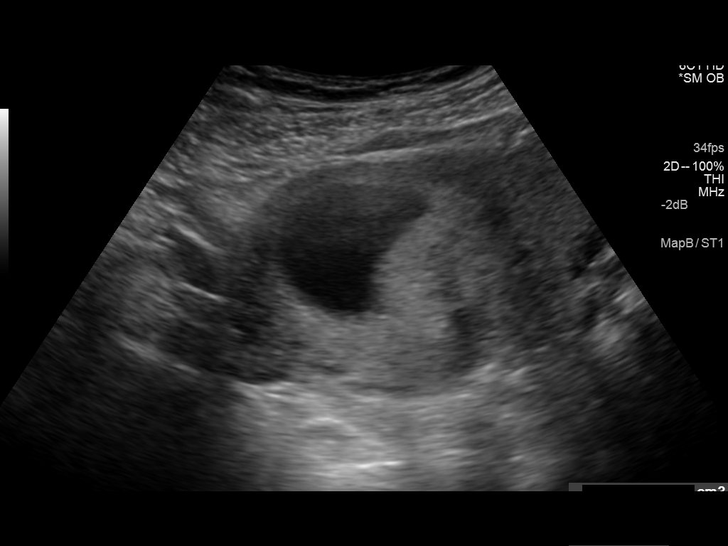
[im 31/37]
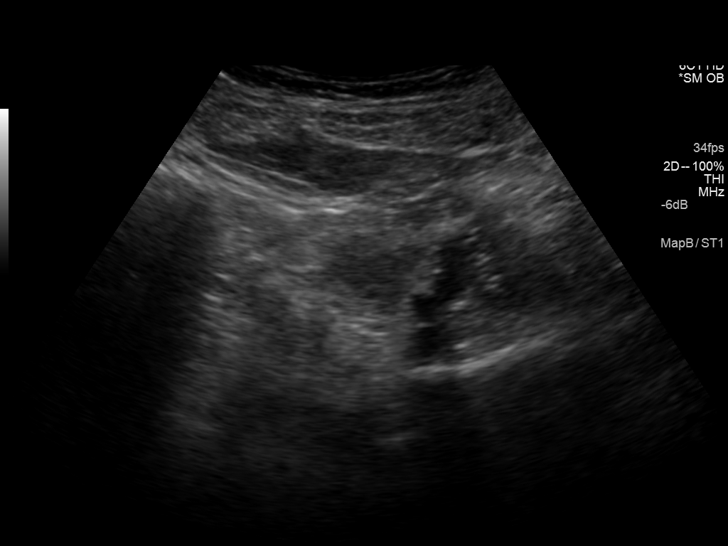
[im 34/37]
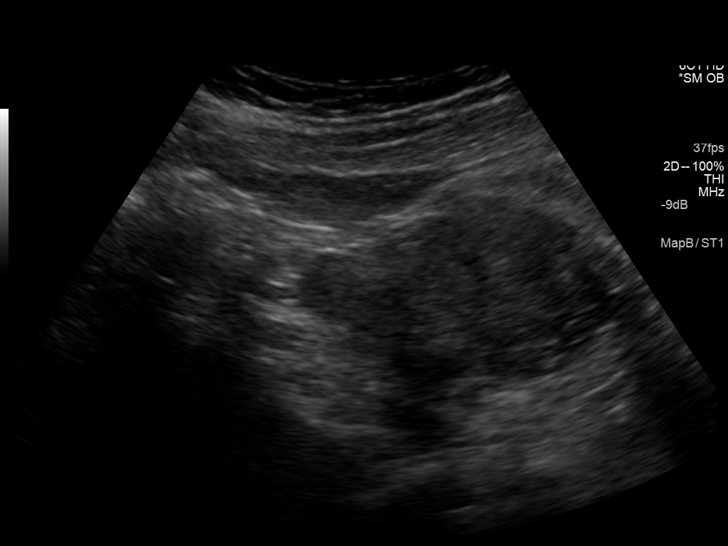
[im 37/37]
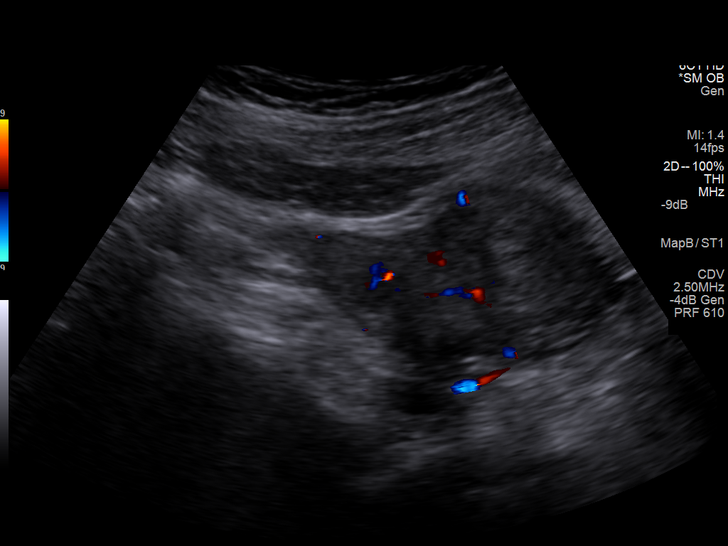

[14 of 28 positions shown; findings below may reference images not displayed]

FINDINGS: Intrauterine gestational sac: Visualized/normal in shape.

Yolk sac:  No

Embryo:  Yes

Cardiac Activity: Yes

Heart Rate: 157 bpm

CRL:   3.3 cm   10 w 1 d                  US EDC: 09/14/2014

Maternal uterus/adnexae: A moderate amount of subchorionic
hemorrhage is noted. The uterus is otherwise unremarkable in
appearance.

The right ovary measures 3.7 x 1.8 x 1.6 cm, while the left ovary
measures 2.5 x 2.1 x 1.5 cm. No suspicious adnexal masses are seen;
there is no evidence for ovarian torsion.

No free fluid is seen within the pelvic cul-de-sac.
IMPRESSION: 1. Single live intrauterine pregnancy noted, with a crown-rump
length of 3.3 cm, corresponding to a gestational age of 10 weeks 1
day. This reflects an estimated date of delivery September 14, 2014.
2. Moderate amount of subchorionic hemorrhage noted.

## 2016-07-07 ENCOUNTER — Emergency Department
Admission: EM | Admit: 2016-07-07 | Discharge: 2016-07-07 | Disposition: A | Discharge status: Home / Self Care | Payer: Medicaid Other | Attending: Emergency Medicine | Admitting: Emergency Medicine

## 2016-07-07 ENCOUNTER — Encounter: Payer: Self-pay | Admitting: Emergency Medicine

## 2016-07-07 DIAGNOSIS — S199XXA Unspecified injury of neck, initial encounter: Secondary | ICD-10-CM | POA: Diagnosis present

## 2016-07-07 DIAGNOSIS — M5412 Radiculopathy, cervical region: Secondary | ICD-10-CM | POA: Insufficient documentation

## 2016-07-07 DIAGNOSIS — Y929 Unspecified place or not applicable: Secondary | ICD-10-CM | POA: Diagnosis not present

## 2016-07-07 DIAGNOSIS — S161XXA Strain of muscle, fascia and tendon at neck level, initial encounter: Secondary | ICD-10-CM | POA: Insufficient documentation

## 2016-07-07 DIAGNOSIS — X58XXXA Exposure to other specified factors, initial encounter: Secondary | ICD-10-CM | POA: Insufficient documentation

## 2016-07-07 DIAGNOSIS — Y939 Activity, unspecified: Secondary | ICD-10-CM | POA: Insufficient documentation

## 2016-07-07 DIAGNOSIS — Y999 Unspecified external cause status: Secondary | ICD-10-CM | POA: Insufficient documentation

## 2016-07-07 MED ORDER — DIAZEPAM 5 MG PO TABS
5.0000 mg | ORAL_TABLET | Freq: Once | ORAL | Status: AC
  Administered 2016-07-07: 5 mg via ORAL
  Filled 2016-07-07: qty 1

## 2016-07-07 MED ORDER — TRAMADOL HCL 50 MG PO TABS: 50.0000 mg | ORAL_TABLET | Freq: Four times a day (QID) | ORAL | 0 refills | Status: DC | PRN

## 2016-07-07 MED ORDER — PREDNISONE 20 MG PO TABS
60.0000 mg | ORAL_TABLET | Freq: Once | ORAL | Status: AC
  Administered 2016-07-07: 60 mg via ORAL
  Filled 2016-07-07: qty 3

## 2016-07-07 MED ORDER — CYCLOBENZAPRINE HCL 5 MG PO TABS: 5.0000 mg | ORAL_TABLET | Freq: Three times a day (TID) | ORAL | 0 refills | Status: DC | PRN

## 2016-07-07 MED ORDER — PREDNISONE 10 MG PO TABS: 10.0000 mg | ORAL_TABLET | Freq: Every day | ORAL | 0 refills | Status: DC

## 2016-07-07 NOTE — ED Triage Notes (Signed)
Pt presents to ED with c/o right sided neck pain that radiates into her shoulder. Onset approx 3 days ago. 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 Pt reports similar symptoms previously on the left side and states she was prescribed a steroid and a muscle relaxer with complete relief after a few days.

## 2016-07-07 NOTE — Discharge Instructions (Signed)
Please take medications as prescribed. Return to the ER for any worsening symptoms urgent changes in her health. Follow-up with orthopedics if no improvement in 5-7 days.

## 2016-07-07 NOTE — ED Provider Notes (Signed)
[facility]-EMERGENCY DEPARTMENT Provider Note   CSN: [id number] Arrival date & time: 07/07/16  2008     History   Chief Complaint Chief Complaint  Patient presents with  . Neck Pain    HPI Terri [name] is a 20 y.o. female presents to emergency department for evaluation of right-sided neck pain, tightness with burning numbness and tingling going on the right arm. Patient's symptoms develop 3 nights ago after working. She denies any trauma or injury. One day ago pain became more severe upon awakening. She describes 8 out of 10 pain and burning down the right arm, biceps and radial aspect of the forearm. No weakness in the upper extremities. She denies any headache or fevers. She has taken ibuprofen 800 mg at 4 PM today with no improvement. She's had similar symptoms several months ago treated with steroids that did resolve her symptoms. She has not followed up with orthopedics. Denies any lower extremity discomfort or weakness.  HPI  History reviewed. No pertinent past medical history.  There are no active problems to display for this patient.   History reviewed. No pertinent surgical history.  OB History    Gravida Para Term Preterm AB Living   2 2           SAB TAB Ectopic Multiple Live Births                   Home Medications    Prior to Admission medications   Medication Sig Start Date End Date Taking? Authorizing Provider  cyclobenzaprine (FLEXERIL) 5 MG tablet Take 1-2 tablets (5-10 mg total) by mouth 3 (three) times daily as needed for muscle spasms. 07/07/16   Evon Slackhomas C Gaines, PA-C  predniSONE (DELTASONE) 10 MG tablet Take 1 tablet (10 mg total) by mouth daily. 6,5,4,3,2,1 six day taper 07/07/16   Evon Slackhomas C Gaines, PA-C  traMADol (ULTRAM) 50 MG tablet Take 1 tablet (50 mg total) by mouth every 6 (six) hours as needed. 07/07/16   Evon Slackhomas C Gaines, PA-C    Family History No family history on file.  Social History Social History  Substance Use Topics  . Smoking  status: Never Smoker  . Smokeless tobacco: Never Used  . Alcohol use No     Allergies   Zithromax [azithromycin]   Review of Systems Review of Systems  Constitutional: Negative for activity change, chills, fatigue and fever.  HENT: Negative for congestion, sinus pressure and sore throat.   Eyes: Negative for visual disturbance.  Respiratory: Negative for cough, chest tightness and shortness of breath.   Cardiovascular: Negative for chest pain and leg swelling.  Gastrointestinal: Negative for abdominal pain, diarrhea, nausea and vomiting.  Genitourinary: Negative for dysuria.  Musculoskeletal: Positive for neck pain. Negative for arthralgias and gait problem.  Skin: Negative for rash.  Neurological: Positive for numbness. Negative for weakness and headaches.  Hematological: Negative for adenopathy.  Psychiatric/Behavioral: Negative for agitation, behavioral problems and confusion.     Physical Exam Updated Vital Signs BP (!) 146/98 (BP Location: Left Arm)   Pulse 99   Temp 98.2 F (36.8 C) (Oral)   Resp 18   Ht 5\' 3"  (1.6 m)   Wt 113.4 kg   LMP 06/26/2016 (Approximate)   SpO2 98%   BMI 44.29 kg/m   Physical Exam  Constitutional: She is oriented to person, place, and time. She appears well-developed and well-nourished. No distress.  HENT:  Head: Normocephalic and atraumatic.  Mouth/Throat: Oropharynx is clear and moist.  Eyes: EOM  are normal. Pupils are equal, round, and reactive to light. Right eye exhibits no discharge. Left eye exhibits no discharge.  Neck: Normal range of motion. Neck supple.  Cardiovascular: Normal rate, regular rhythm and intact distal pulses.   Pulmonary/Chest: Effort normal and breath sounds normal. No respiratory distress. She exhibits no tenderness.  Abdominal: Soft. She exhibits no distension. There is no tenderness.  Musculoskeletal: Normal range of motion. She exhibits no edema.  Cervical Spine: Examination of the cervical spine reveals  no bony abnormality, no edema, and no ecchymosis.  There is no step-off.  The patient has decreased active and passive range of motion of the cervical spine with flexion, extension, she has normal active and passive range of motion with right and left bend with rotation.  There is no crepitus with range of motion exercises.  The patient is non-tender along the spinous process to palpation.  The patient has right paravertebral muscle tenderness with spasms noted.  There is no parascapular discomfort.  The patient has a positive right Spurling test.  The patient has a negative overhead arm test for thoracic outlet syndrome.    Left and right Upper Extremity: Examination of the left and right shoulder and arm showed no bony abnormality or edema.  The patient has normal active and passive motion with abduction, flexion, internal rotation, and external rotation.  The patient has no tenderness with motion.  The patient has a negative Hawkins test and a negative impingement test.  The patient has a negative drop arm test.  The patient is non-tender along the deltoid muscle.  There is no subacromial space tenderness with no AC joint tenderness.  The patient has no instability of the shoulder with anterior-posterior motion.  There is a negative sulcus sign.  The rotator cuff muscle strength is 5/5 with supraspinatus, 5/5 with internal rotation, and 5/5 with external rotation.  There is no crepitus with range of motion activities.      Neurological: She is alert and oriented to person, place, and time. She has normal reflexes.  Skin: Skin is warm and dry.  Psychiatric: She has a normal mood and affect. Her behavior is normal. Thought content normal.     ED Treatments / Results  Labs (all labs ordered are listed, but only abnormal results are displayed) Labs Reviewed - No data to display  EKG  EKG Interpretation None       Radiology No results found.  Procedures Procedures (including critical care  time)  Medications Ordered in ED Medications  diazepam (VALIUM) tablet 5 mg (5 mg Oral Given 07/07/16 2051)  predniSONE (DELTASONE) tablet 60 mg (60 mg Oral Given 07/07/16 2051)     Initial Impression / Assessment and Plan / ED Course  I have reviewed the triage vital signs and the nursing notes.  Pertinent labs & imaging results that were available during my care of the patient were reviewed by me and considered in my medical decision making (see chart for details).  Clinical Course     20 year old female with right sided cervical muscle tightness with cervical radicular symptoms. No neurological deficits on exam. Symptoms improved with Valium, prednisone. She is sent home with a prednisone taper muscle relaxer and pain medication. She is educated on signs and symptoms return to the emergency department for.  Final Clinical Impressions(s) / ED Diagnoses   Final diagnoses:  Acute strain of neck muscle, initial encounter  Cervical radiculopathy    New Prescriptions New Prescriptions   CYCLOBENZAPRINE (FLEXERIL) 5  MG TABLET    Take 1-2 tablets (5-10 mg total) by mouth 3 (three) times daily as needed for muscle spasms.   PREDNISONE (DELTASONE) 10 MG TABLET    Take 1 tablet (10 mg total) by mouth daily. 6,5,4,3,2,1 six day taper   TRAMADOL (ULTRAM) 50 MG TABLET    Take 1 tablet (50 mg total) by mouth every 6 (six) hours as needed.     Evon Slackhomas C Gaines, PA-C 07/07/16 2139    Loleta Roseory Forbach, MD 07/08/16 [phone]

## 2017-07-03 ENCOUNTER — Encounter: Payer: Self-pay | Admitting: Emergency Medicine

## 2017-07-03 DIAGNOSIS — B349 Viral infection, unspecified: Secondary | ICD-10-CM | POA: Insufficient documentation

## 2017-07-03 DIAGNOSIS — Z79899 Other long term (current) drug therapy: Secondary | ICD-10-CM | POA: Insufficient documentation

## 2017-07-03 DIAGNOSIS — M791 Myalgia, unspecified site: Secondary | ICD-10-CM | POA: Insufficient documentation

## 2017-07-03 DIAGNOSIS — R509 Fever, unspecified: Secondary | ICD-10-CM | POA: Insufficient documentation

## 2017-07-03 DIAGNOSIS — R221 Localized swelling, mass and lump, neck: Secondary | ICD-10-CM | POA: Insufficient documentation

## 2017-07-03 LAB — COMPREHENSIVE METABOLIC PANEL
ALT: 11 U/L — AB (ref 14–54)
AST: 12 U/L — ABNORMAL LOW (ref 15–41)
Albumin: 3.4 g/dL — ABNORMAL LOW (ref 3.5–5.0)
Alkaline Phosphatase: 60 U/L (ref 38–126)
Anion gap: 10 (ref 5–15)
BUN: <5 mg/dL — ABNORMAL LOW (ref 6–20)
CALCIUM: 8.8 mg/dL — AB (ref 8.9–10.3)
CHLORIDE: 103 mmol/L (ref 101–111)
CO2: 21 mmol/L — ABNORMAL LOW (ref 22–32)
CREATININE: 0.51 mg/dL (ref 0.44–1.00)
Glucose, Bld: 91 mg/dL (ref 65–99)
Potassium: 3.4 mmol/L — ABNORMAL LOW (ref 3.5–5.1)
Sodium: 134 mmol/L — ABNORMAL LOW (ref 135–145)
Total Bilirubin: 0.9 mg/dL (ref 0.3–1.2)
Total Protein: 7.2 g/dL (ref 6.5–8.1)

## 2017-07-03 LAB — URINALYSIS, COMPLETE (UACMP) WITH MICROSCOPIC
BILIRUBIN URINE: NEGATIVE
Bacteria, UA: NONE SEEN
GLUCOSE, UA: NEGATIVE mg/dL
Hgb urine dipstick: NEGATIVE
KETONES UR: 80 mg/dL — AB
LEUKOCYTES UA: NEGATIVE
Nitrite: NEGATIVE
PH: 5 (ref 5.0–8.0)
Protein, ur: 30 mg/dL — AB
Specific Gravity, Urine: 1.026 (ref 1.005–1.030)

## 2017-07-03 LAB — CBC
HCT: 39.2 % (ref 35.0–47.0)
Hemoglobin: 13.4 g/dL (ref 12.0–16.0)
MCH: 27.9 pg (ref 26.0–34.0)
MCHC: 34.1 g/dL (ref 32.0–36.0)
MCV: 81.9 fL (ref 80.0–100.0)
PLATELETS: 164 10*3/uL (ref 150–440)
RBC: 4.78 MIL/uL (ref 3.80–5.20)
RDW: 13.1 % (ref 11.5–14.5)
WBC: 19.2 10*3/uL — ABNORMAL HIGH (ref 3.6–11.0)

## 2017-07-03 LAB — HCG, QUANTITATIVE, PREGNANCY: hCG, Beta Chain, Quant, S: 94217 m[IU]/mL — ABNORMAL HIGH (ref <5)

## 2017-07-03 LAB — POCT RAPID STREP A: Streptococcus, Group A Screen (Direct): NEGATIVE

## 2017-07-03 LAB — LIPASE, BLOOD: LIPASE: 16 U/L (ref 11–51)

## 2017-07-03 NOTE — ED Notes (Signed)
Pt st headache is worse than when she initally came in. Pt was found asleep in the main lobby. RN notified/ VS updated

## 2017-07-03 NOTE — ED Triage Notes (Signed)
Pt ambulatory to triage in NAD, report awoke this AM with n/v/d, body aches, sore throat, bilateral ear pain, and soreness/swelling to left side of neck.  Pt also reports recently had positive pregnancy test.  Reports taking tylenol cold and flu at home w/o relief.

## 2017-07-04 ENCOUNTER — Emergency Department: Payer: Self-pay

## 2017-07-04 ENCOUNTER — Emergency Department
Admission: EM | Admit: 2017-07-04 | Discharge: 2017-07-04 | Disposition: A | Discharge status: Home / Self Care | Payer: Self-pay | Attending: Emergency Medicine | Admitting: Emergency Medicine

## 2017-07-04 DIAGNOSIS — B349 Viral infection, unspecified: Secondary | ICD-10-CM

## 2017-07-04 DIAGNOSIS — R05 Cough: Secondary | ICD-10-CM

## 2017-07-04 DIAGNOSIS — R509 Fever, unspecified: Secondary | ICD-10-CM

## 2017-07-04 DIAGNOSIS — R6889 Other general symptoms and signs: Secondary | ICD-10-CM

## 2017-07-04 DIAGNOSIS — O219 Vomiting of pregnancy, unspecified: Secondary | ICD-10-CM

## 2017-07-04 DIAGNOSIS — R059 Cough, unspecified: Secondary | ICD-10-CM

## 2017-07-04 LAB — INFLUENZA PANEL BY PCR (TYPE A & B)
INFLAPCR: NEGATIVE
INFLBPCR: NEGATIVE

## 2017-07-04 MED ORDER — ACETAMINOPHEN 500 MG PO TABS
1000.0000 mg | ORAL_TABLET | Freq: Once | ORAL | Status: AC
  Administered 2017-07-04: 1000 mg via ORAL
  Filled 2017-07-04: qty 2

## 2017-07-04 MED ORDER — METOCLOPRAMIDE HCL 5 MG/ML IJ SOLN
10.0000 mg | Freq: Once | INTRAMUSCULAR | Status: AC
  Administered 2017-07-04: 10 mg via INTRAVENOUS
  Filled 2017-07-04: qty 2

## 2017-07-04 MED ORDER — METOCLOPRAMIDE HCL 10 MG PO TABS: 10.0000 mg | ORAL_TABLET | Freq: Three times a day (TID) | ORAL | 0 refills | Status: DC | PRN

## 2017-07-04 MED ORDER — SODIUM CHLORIDE 0.9 % IV BOLUS (SEPSIS)
1000.0000 mL | Freq: Once | INTRAVENOUS | Status: AC
  Administered 2017-07-04: 1000 mL via INTRAVENOUS

## 2017-07-04 NOTE — Discharge Instructions (Signed)
PLease follow up with your primary care physician and OB/GYN Please continue to take tylenol and take the nausea medication as prescribed. PLease return with any other concerns or worsened condition

## 2017-07-04 NOTE — ED Notes (Signed)
See downtime documentation in paper chart from 1:45am - 3am, 07/04/17

## 2017-07-04 NOTE — ED Provider Notes (Signed)
[facility] Emergency Department Provider Note   ____________________________________________   First MD Initiated Contact with Patient 07/04/17 0024     (approximate)  I have reviewed the triage vital signs and the nursing notes.   HISTORY  Chief Complaint Generalized Body Aches and Emesis    HPI Terri [name] is a 21 y.o. female who comes into the hospital today with some vomiting and feeling bad all over her body. She states that she woke up at 4 AM with vomiting and all over body pain. She has some throat pain, headache, earache and swelling of her left neck. The patient reports that her sister has been sick with a cough. Her sister never went to the doctor so she is unsure exactly what was going on. The patient states that she's had a cough today. She has been taking Tylenol Cold and flu. She states that she is continued to feel bad but it has helped with her headache. The patient had a fever at home to 101 this morning. The patient states that she also has been vomiting whenever she tries to eat or drink. She reports that her last menstrual period was September 2 and she recently took a test a few weeks ago and found out that she was pregnant. The patient denies any abdominal pain or back pain. She decided to come into the hospital today for further evaluation of her symptoms.   History reviewed. No pertinent past medical history.  There are no active problems to display for this patient.   History reviewed. No pertinent surgical history.  Prior to Admission medications   Medication Sig Start Date End Date Taking? Authorizing Provider  cyclobenzaprine (FLEXERIL) 5 MG tablet Take 1-2 tablets (5-10 mg total) by mouth 3 (three) times daily as needed for muscle spasms. Patient not taking: Reported on 07/04/2017 07/07/16   Evon Slack, PA-C  metoCLOPramide (REGLAN) 10 MG tablet Take 1 tablet (10 mg total) every 8 (eight) hours as needed by mouth.  07/04/17   Rebecka Apley, MD  predniSONE (DELTASONE) 10 MG tablet Take 1 tablet (10 mg total) by mouth daily. 6,5,4,3,2,1 six day taper Patient not taking: Reported on 07/04/2017 07/07/16   Evon Slack, PA-C  traMADol (ULTRAM) 50 MG tablet Take 1 tablet (50 mg total) by mouth every 6 (six) hours as needed. Patient not taking: Reported on 07/04/2017 07/07/16   Evon Slack, PA-C    Allergies Zithromax [azithromycin]  History reviewed. No pertinent family history.  Social History Social History   Tobacco Use  . Smoking status: Never Smoker  . Smokeless tobacco: Never Used  Substance Use Topics  . Alcohol use: No  . Drug use: No    Review of Systems  Constitutional:  fever/chills Eyes: No visual changes. ENT:  sore throat, earache Cardiovascular: Denies chest pain. Respiratory: Cough without shortness of breath. Gastrointestinal: No abdominal pain.  No nausea, no vomiting.  No diarrhea.  No constipation. Genitourinary: Negative for dysuria. Musculoskeletal: Negative for back pain. Skin: Negative for rash. Neurological: Headache   ____________________________________________   PHYSICAL EXAM:  VITAL SIGNS: ED Triage Vitals [07/03/17 2117]  Enc Vitals Group     BP 129/85     Pulse Rate 97     Resp 18     Temp 98.5 F (36.9 C)     Temp Source Oral     SpO2 98 %     Weight 250 lb (113.4 kg)     Height  5\' 3"  (1.6 m)     Head Circumference      Peak Flow      Pain Score 6     Pain Loc      Pain Edu?      Excl. in GC?     Constitutional: Alert and oriented. Well appearing and in water distress. Eyes: Conjunctivae are normal. PERRL. EOMI. Head: Atraumatic. Nose: No congestion/rhinnorhea. Mouth/Throat: Mucous membranes are moist.  Oropharynx non-erythematous. Neck: No signs of meningismus, neck supple Hematological/Lymphatic/Immunilogical: Tender, lateral cervical lymphadenopathy. Cardiovascular: Normal rate, regular rhythm. Grossly normal heart  sounds.  Good peripheral circulation. Respiratory: Normal respiratory effort.  No retractions. Lungs CTAB. Gastrointestinal: Soft and nontender. No distention. Positive bowel sounds Musculoskeletal: No lower extremity tenderness nor edema.   Neurologic:  Normal speech and language.  Skin:  Skin is warm, dry and intact.  Psychiatric: Mood and affect are normal.   ____________________________________________   LABS (all labs ordered are listed, but only abnormal results are displayed)  Labs Reviewed  COMPREHENSIVE METABOLIC PANEL - Abnormal; Notable for the following components:      Result Value   Sodium 134 (*)    Potassium 3.4 (*)    CO2 21 (*)    BUN <5 (*)    Calcium 8.8 (*)    Albumin 3.4 (*)    AST 12 (*)    ALT 11 (*)    All other components within normal limits  CBC - Abnormal; Notable for the following components:   WBC 19.2 (*)    All other components within normal limits  URINALYSIS, COMPLETE (UACMP) WITH MICROSCOPIC - Abnormal; Notable for the following components:   Color, Urine AMBER (*)    APPearance CLEAR (*)    Ketones, ur 80 (*)    Protein, ur 30 (*)    Squamous Epithelial / LPF 0-5 (*)    All other components within normal limits  HCG, QUANTITATIVE, PREGNANCY - Abnormal; Notable for the following components:   hCG, Beta Chain, Quant, S 94,217 (*)    All other components within normal limits  CULTURE, GROUP A STREP (THRC)  LIPASE, BLOOD  INFLUENZA PANEL BY PCR (TYPE A & B)  POCT RAPID STREP A   ____________________________________________  EKG  none ____________________________________________  RADIOLOGY  No results found.  ____________________________________________   PROCEDURES  Procedure(s) performed: None  Procedures  Critical Care performed: No  ____________________________________________   INITIAL IMPRESSION / ASSESSMENT AND PLAN / ED COURSE  As part of my medical decision making, I reviewed the following data within the  electronic MEDICAL RECORD NUMBER Notes from prior ED visits and [place] Controlled Substance Database   This is a 21 year old female who comes into the hospital today with some vomiting, fevers and body aches.   My differential diagnosis includes strep throat, influenza, hyperemesis gravidarum, urinary tract infection.  The patient did have some blood work drawn and her white blood cell count was 19.2. Her lipase was 16 and her quantitative beta hCG was elevated. As the patient is not having any abdominal pain or vaginal bleeding I did not send her for an ultrasound. The patient had a strep test which was negative as well as a flu swab which was negative. I gave the patient a liter of normal saline and a dose of Reglan. She was then able to take some oral hydration without any vomiting in the emergency department. I sent the patient as well for chest x-ray to ensure that she did not have pneumonia  but that was negative. Although the patient's white blood cell count is elevated she is not having any belly pain any signs of urinary tract infection or any pneumonia. I feel that at this point the patient may be discharged home and she should return if she has any worsening symptoms or any other concerns or is unable to keep down any fluids. The patient will be discharged home to follow-up with her primary care physician. The patient did have some lymphadenopathy laterally which was the pain that she had but she has no signs of an ear infection. The patient understands the plan and will follow-up.      ____________________________________________   FINAL CLINICAL IMPRESSION(S) / ED DIAGNOSES  Final diagnoses:  Flu-like symptoms  Viral illness  Nausea and vomiting in pregnancy      NEW MEDICATIONS STARTED DURING THIS VISIT:  This SmartLink is deprecated. Use AVSMEDLIST instead to display the medication list for a patient.   Note:  This document was prepared using Dragon voice recognition software and  may include unintentional dictation errors.    Rebecka ApleyWebster, Sheretha Shadd P, MD 07/04/17 [phone]

## 2017-07-06 LAB — CULTURE, GROUP A STREP (THRC)

## 2017-07-13 ENCOUNTER — Emergency Department
Admission: EM | Admit: 2017-07-13 | Discharge: 2017-07-13 | Disposition: A | Discharge status: Home / Self Care | Payer: Self-pay | Attending: Emergency Medicine | Admitting: Emergency Medicine

## 2017-07-13 ENCOUNTER — Encounter: Payer: Self-pay | Admitting: Emergency Medicine

## 2017-07-13 ENCOUNTER — Emergency Department: Payer: Self-pay | Admitting: Anesthesiology

## 2017-07-13 ENCOUNTER — Other Ambulatory Visit: Payer: Self-pay

## 2017-07-13 ENCOUNTER — Encounter
Admission: EM | Disposition: A | Discharge status: Home / Self Care | Payer: Self-pay | Source: Home / Self Care | Attending: Emergency Medicine

## 2017-07-13 DIAGNOSIS — N939 Abnormal uterine and vaginal bleeding, unspecified: Secondary | ICD-10-CM

## 2017-07-13 DIAGNOSIS — Z3A11 11 weeks gestation of pregnancy: Secondary | ICD-10-CM | POA: Insufficient documentation

## 2017-07-13 DIAGNOSIS — Z79899 Other long term (current) drug therapy: Secondary | ICD-10-CM | POA: Insufficient documentation

## 2017-07-13 DIAGNOSIS — Z6838 Body mass index (BMI) 38.0-38.9, adult: Secondary | ICD-10-CM | POA: Insufficient documentation

## 2017-07-13 DIAGNOSIS — O034 Incomplete spontaneous abortion without complication: Secondary | ICD-10-CM | POA: Insufficient documentation

## 2017-07-13 DIAGNOSIS — E669 Obesity, unspecified: Secondary | ICD-10-CM | POA: Insufficient documentation

## 2017-07-13 DIAGNOSIS — O036 Delayed or excessive hemorrhage following complete or unspecified spontaneous abortion: Secondary | ICD-10-CM

## 2017-07-13 HISTORY — PX: DILATION AND EVACUATION: SHX1459

## 2017-07-13 LAB — CBC
HEMATOCRIT: 36.7 % (ref 35.0–47.0)
Hemoglobin: 12.9 g/dL (ref 12.0–16.0)
MCH: 28.6 pg (ref 26.0–34.0)
MCHC: 35.2 g/dL (ref 32.0–36.0)
MCV: 81.2 fL (ref 80.0–100.0)
PLATELETS: 189 10*3/uL (ref 150–440)
RBC: 4.53 MIL/uL (ref 3.80–5.20)
RDW: 12.9 % (ref 11.5–14.5)
WBC: 23.1 10*3/uL — ABNORMAL HIGH (ref 3.6–11.0)

## 2017-07-13 LAB — COMPREHENSIVE METABOLIC PANEL
ALT: 11 U/L — ABNORMAL LOW (ref 14–54)
AST: 14 U/L — ABNORMAL LOW (ref 15–41)
Albumin: 3.1 g/dL — ABNORMAL LOW (ref 3.5–5.0)
Alkaline Phosphatase: 83 U/L (ref 38–126)
Anion gap: 11 (ref 5–15)
BILIRUBIN TOTAL: 0.6 mg/dL (ref 0.3–1.2)
BUN: 6 mg/dL (ref 6–20)
CHLORIDE: 104 mmol/L (ref 101–111)
CO2: 20 mmol/L — ABNORMAL LOW (ref 22–32)
CREATININE: 0.35 mg/dL — AB (ref 0.44–1.00)
Calcium: 8.7 mg/dL — ABNORMAL LOW (ref 8.9–10.3)
Glucose, Bld: 115 mg/dL — ABNORMAL HIGH (ref 65–99)
POTASSIUM: 3 mmol/L — AB (ref 3.5–5.1)
Sodium: 135 mmol/L (ref 135–145)
TOTAL PROTEIN: 7 g/dL (ref 6.5–8.1)

## 2017-07-13 LAB — TYPE AND SCREEN
ABO/RH(D): O POS
ANTIBODY SCREEN: NEGATIVE

## 2017-07-13 LAB — ABO/RH: ABO/RH(D): O POS

## 2017-07-13 LAB — HCG, QUANTITATIVE, PREGNANCY: HCG, BETA CHAIN, QUANT, S: 71737 m[IU]/mL — AB (ref <5)

## 2017-07-13 SURGERY — DILATION AND EVACUATION, UTERUS
Anesthesia: Choice

## 2017-07-13 SURGERY — DILATION AND EVACUATION, UTERUS
Anesthesia: General

## 2017-07-13 MED ORDER — ONDANSETRON HCL 4 MG/2ML IJ SOLN
INTRAMUSCULAR | Status: AC
  Administered 2017-07-13: 4 mg
  Filled 2017-07-13: qty 2

## 2017-07-13 MED ORDER — SUCCINYLCHOLINE CHLORIDE 20 MG/ML IJ SOLN
INTRAMUSCULAR | Status: DC | PRN
  Administered 2017-07-13: 100 mg via INTRAVENOUS

## 2017-07-13 MED ORDER — FENTANYL CITRATE (PF) 100 MCG/2ML IJ SOLN
INTRAMUSCULAR | Status: DC | PRN
  Administered 2017-07-13 (×3): 50 ug via INTRAVENOUS

## 2017-07-13 MED ORDER — FENTANYL CITRATE (PF) 100 MCG/2ML IJ SOLN: 25.0000 ug | INTRAMUSCULAR | Status: DC | PRN

## 2017-07-13 MED ORDER — METHYLERGONOVINE MALEATE 0.2 MG/ML IJ SOLN
INTRAMUSCULAR | Status: AC
  Filled 2017-07-13: qty 1

## 2017-07-13 MED ORDER — OXYCODONE HCL 5 MG/5ML PO SOLN: 5.0000 mg | Freq: Once | ORAL | Status: AC | PRN

## 2017-07-13 MED ORDER — ACETAMINOPHEN 650 MG RE SUPP
650.0000 mg | RECTAL | Status: DC | PRN
  Filled 2017-07-13: qty 1

## 2017-07-13 MED ORDER — FENTANYL CITRATE (PF) 100 MCG/2ML IJ SOLN
INTRAMUSCULAR | Status: AC
  Filled 2017-07-13: qty 2

## 2017-07-13 MED ORDER — DOXYCYCLINE HYCLATE 100 MG PO TABS: 200.0000 mg | ORAL_TABLET | Freq: Once | ORAL | Status: DC

## 2017-07-13 MED ORDER — LIDOCAINE HCL (PF) 1 % IJ SOLN
INTRAMUSCULAR | Status: AC
  Filled 2017-07-13: qty 30

## 2017-07-13 MED ORDER — DOXYCYCLINE MONOHYDRATE 100 MG PO TABS: 100.0000 mg | ORAL_TABLET | Freq: Two times a day (BID) | ORAL | 0 refills | Status: DC

## 2017-07-13 MED ORDER — ACETAMINOPHEN 325 MG PO TABS: 650.0000 mg | ORAL_TABLET | ORAL | Status: DC | PRN

## 2017-07-13 MED ORDER — ONDANSETRON HCL 4 MG/2ML IJ SOLN
INTRAMUSCULAR | Status: DC | PRN
Start: 2017-07-13 — End: 2017-07-13
  Administered 2017-07-13: 4 mg via INTRAVENOUS

## 2017-07-13 MED ORDER — OXYTOCIN 10 UNIT/ML IJ SOLN
INTRAVENOUS | Status: DC | PRN
  Administered 2017-07-13: 10 [IU] via INTRAVENOUS

## 2017-07-13 MED ORDER — MIDAZOLAM HCL 2 MG/2ML IJ SOLN
INTRAMUSCULAR | Status: DC | PRN
  Administered 2017-07-13: 2 mg via INTRAVENOUS

## 2017-07-13 MED ORDER — KETOROLAC TROMETHAMINE 30 MG/ML IJ SOLN
INTRAMUSCULAR | Status: AC
  Filled 2017-07-13: qty 1

## 2017-07-13 MED ORDER — OXYTOCIN 10 UNIT/ML IJ SOLN
INTRAMUSCULAR | Status: AC
  Filled 2017-07-13: qty 1

## 2017-07-13 MED ORDER — MISOPROSTOL 200 MCG PO TABS
ORAL_TABLET | ORAL | Status: AC
  Filled 2017-07-13: qty 5

## 2017-07-13 MED ORDER — METHYLERGONOVINE MALEATE 0.2 MG PO TABS: 0.2000 mg | ORAL_TABLET | Freq: Three times a day (TID) | ORAL | 0 refills | Status: AC

## 2017-07-13 MED ORDER — KETOROLAC TROMETHAMINE 30 MG/ML IJ SOLN
30.0000 mg | Freq: Four times a day (QID) | INTRAMUSCULAR | Status: DC
Start: 2017-07-13 — End: 2017-07-13

## 2017-07-13 MED ORDER — SODIUM CHLORIDE 0.9 % IV BOLUS (SEPSIS)
1000.0000 mL | Freq: Once | INTRAVENOUS | Status: AC
  Administered 2017-07-13: 1000 mL via INTRAVENOUS

## 2017-07-13 MED ORDER — MIDAZOLAM HCL 2 MG/2ML IJ SOLN
INTRAMUSCULAR | Status: AC
  Filled 2017-07-13: qty 2

## 2017-07-13 MED ORDER — LACTATED RINGERS IV SOLN
INTRAVENOUS | Status: DC | PRN
  Administered 2017-07-13: 07:00:00 via INTRAVENOUS

## 2017-07-13 MED ORDER — DEXAMETHASONE SODIUM PHOSPHATE 10 MG/ML IJ SOLN
INTRAMUSCULAR | Status: AC
  Filled 2017-07-13: qty 1

## 2017-07-13 MED ORDER — PROPOFOL 10 MG/ML IV BOLUS
INTRAVENOUS | Status: DC | PRN
  Administered 2017-07-13: 180 mg via INTRAVENOUS

## 2017-07-13 MED ORDER — MORPHINE SULFATE (PF) 4 MG/ML IV SOLN: 1.0000 mg | INTRAVENOUS | Status: DC | PRN

## 2017-07-13 MED ORDER — KETOROLAC TROMETHAMINE 15 MG/ML IJ SOLN
INTRAMUSCULAR | Status: AC
  Administered 2017-07-13: 15 mg
  Filled 2017-07-13: qty 1

## 2017-07-13 MED ORDER — MISOPROSTOL 100 MCG PO TABS
ORAL_TABLET | ORAL | Status: DC | PRN
  Administered 2017-07-13: 400 ug via RECTAL

## 2017-07-13 MED ORDER — METHYLERGONOVINE MALEATE 0.2 MG/ML IJ SOLN
INTRAMUSCULAR | Status: DC | PRN
  Administered 2017-07-13: 0.2 mg via INTRAMUSCULAR

## 2017-07-13 MED ORDER — KETOROLAC TROMETHAMINE 30 MG/ML IJ SOLN: 15.0000 mg | Freq: Once | INTRAMUSCULAR | Status: DC

## 2017-07-13 MED ORDER — IBUPROFEN 800 MG PO TABS: 800.0000 mg | ORAL_TABLET | Freq: Three times a day (TID) | ORAL | 0 refills | Status: DC | PRN

## 2017-07-13 MED ORDER — ONDANSETRON HCL 4 MG/2ML IJ SOLN
INTRAMUSCULAR | Status: AC
  Filled 2017-07-13: qty 2

## 2017-07-13 MED ORDER — LACTATED RINGERS IV SOLN: INTRAVENOUS | Status: DC

## 2017-07-13 MED ORDER — LIDOCAINE HCL (CARDIAC) 20 MG/ML IV SOLN
INTRAVENOUS | Status: DC | PRN
  Administered 2017-07-13: 60 mg via INTRAVENOUS

## 2017-07-13 MED ORDER — OXYCODONE HCL 5 MG PO TABS
ORAL_TABLET | ORAL | Status: AC
  Filled 2017-07-13: qty 1

## 2017-07-13 MED ORDER — TRANEXAMIC ACID 1000 MG/10ML IV SOLN
1000.0000 mg | INTRAVENOUS | Status: AC
  Administered 2017-07-13: 1000 mg via INTRAVENOUS
  Filled 2017-07-13: qty 10

## 2017-07-13 MED ORDER — DEXAMETHASONE SODIUM PHOSPHATE 10 MG/ML IJ SOLN
INTRAMUSCULAR | Status: DC | PRN
Start: 2017-07-13 — End: 2017-07-13
  Administered 2017-07-13: 10 mg via INTRAVENOUS

## 2017-07-13 MED ORDER — PROPOFOL 10 MG/ML IV BOLUS
INTRAVENOUS | Status: AC
  Filled 2017-07-13: qty 20

## 2017-07-13 MED ORDER — DOXYCYCLINE HYCLATE 100 MG IV SOLR
100.0000 mg | Freq: Once | INTRAVENOUS | Status: AC
  Administered 2017-07-13: 100 mg via INTRAVENOUS
  Filled 2017-07-13: qty 100

## 2017-07-13 MED ORDER — OXYCODONE HCL 5 MG PO TABS
5.0000 mg | ORAL_TABLET | Freq: Once | ORAL | Status: AC | PRN
Start: 2017-07-13 — End: 2017-07-13
  Administered 2017-07-13: 5 mg via ORAL

## 2017-07-13 MED ORDER — MORPHINE SULFATE (PF) 4 MG/ML IV SOLN
INTRAVENOUS | Status: AC
  Administered 2017-07-13: 4 mg
  Filled 2017-07-13: qty 1

## 2017-07-13 SURGICAL SUPPLY — 23 items
CANISTER SUC SOCK COL 7IN (MISCELLANEOUS) IMPLANT
CATH ROBINSON RED A/P 16FR (CATHETERS) ×2 IMPLANT
FILTER UTR ASPR SPEC (MISCELLANEOUS) IMPLANT
FLTR UTR ASPR SPEC (MISCELLANEOUS)
GLOVE PI ORTHOPRO 6.5 (GLOVE) ×3
GLOVE PI ORTHOPRO STRL 6.5 (GLOVE) ×3 IMPLANT
GLOVE SURG SYN 6.5 ES PF (GLOVE) ×6 IMPLANT
GOWN STRL REUS W/ TWL LRG LVL3 (GOWN DISPOSABLE) ×3 IMPLANT
GOWN STRL REUS W/TWL LRG LVL3 (GOWN DISPOSABLE) ×3
KIT BERKELEY 1ST TRIMESTER 3/8 (MISCELLANEOUS) IMPLANT
KIT RM TURNOVER CYSTO AR (KITS) ×2 IMPLANT
NEEDLE SPNL 20GX3.5 QUINCKE YW (NEEDLE) ×2 IMPLANT
NS IRRIG 500ML POUR BTL (IV SOLUTION) ×2 IMPLANT
PACK DNC HYST (MISCELLANEOUS) ×2 IMPLANT
PAD OB MATERNITY 4.3X12.25 (PERSONAL CARE ITEMS) ×2 IMPLANT
PAD PREP 24X41 OB/GYN DISP (PERSONAL CARE ITEMS) ×2 IMPLANT
SET BERKELEY SUCTION TUBING (SUCTIONS) ×2 IMPLANT
SYRINGE 10CC LL (SYRINGE) ×2 IMPLANT
TOWEL OR 17X26 4PK STRL BLUE (TOWEL DISPOSABLE) ×2 IMPLANT
VACURETTE 10 RIGID CVD (CANNULA) IMPLANT
VACURETTE 12 RIGID CVD (CANNULA) IMPLANT
VACURETTE 8 RIGID CVD (CANNULA) ×2 IMPLANT
VACURETTE 8MM F TIP (MISCELLANEOUS) ×2 IMPLANT

## 2017-07-13 NOTE — Transfer of Care (Signed)
Immediate Anesthesia Transfer of Care Note  Patient: Terri [name]  Procedure(s) Performed: DILATATION AND EVACUATION (N/A )  Patient Location: PACU  Anesthesia Type:General  Level of Consciousness: drowsy and patient cooperative  Airway & Oxygen Therapy: Patient Spontanous Breathing and Patient connected to nasal cannula oxygen  Post-op Assessment: Report given to RN and Post -op Vital signs reviewed and stable  Post vital signs: Reviewed and stable  Last Vitals:  Vitals:   07/13/17 0600 07/13/17 0754  BP: 117/75   Pulse: 95   Resp: 10   Temp:  (!) (P) 36.4 C  SpO2: 99%     Last Pain:  Vitals:   07/13/17 0518  TempSrc: Tympanic  PainSc: 9          Complications: No apparent anesthesia complications

## 2017-07-13 NOTE — Anesthesia Post-op Follow-up Note (Signed)
Anesthesia QCDR form completed.        

## 2017-07-13 NOTE — ED Notes (Signed)
OB in with pt. Some placenta removed by OB MD. Pt to go to the OR for a D&C.

## 2017-07-13 NOTE — ED Triage Notes (Signed)
Pt assisted out of vehicle with SO with heavy vag bleeding noted, tearful; pt taken to room 2 via w/c; assisted into hosp gown; blood clots noted; pt appears uncomfortable, grimacing; reports G3P2, approx [redacted]wks pregnant; no prenatal care yet, as [facility]; pt reports no c/o until tonight. PTA, suddent onset "pop" abd cramping then vag bleeding and dizziness; took ibuprofen PTA

## 2017-07-13 NOTE — Discharge Instructions (Signed)
You should expect to have some cramping and vaginal bleeding for about a week. This should taper off and subside, much like a period. If heavy bleeding continues or gets worse, you should contact the office for an earlier appointment.   Please call the office or physician on call for fever >101, severe pain, and heavy bleeding.  [facility]: [phone]   Or report to the local emergency room.  NOTHING IN THE VAGINA FOR 2 WEEKS!!  Take the methergine 0.2mg  three times daily until it runs out. Take Doxycycline 200mg  ONCE TIME tonight at 7pm ish, make sure you've eaten something about 30minutes before  Take Ibuprofen three times daily for the first few days, then as needed.

## 2017-07-13 NOTE — ED Notes (Signed)
Pt states fter the the last cramp there was no gush of blood.

## 2017-07-13 NOTE — ED Notes (Signed)
Pain ranging from a 4 to a 9 from no cramps to cramping. Cramps lasting about 5 to 6 seconds at a time. Continue to come intermittently.

## 2017-07-13 NOTE — Anesthesia Postprocedure Evaluation (Signed)
Anesthesia Post Note  Patient: Terri [name]  Procedure(s) Performed: DILATATION AND EVACUATION (N/A )  Patient location during evaluation: PACU Anesthesia Type: General Level of consciousness: awake and alert Pain management: pain level controlled Vital Signs Assessment: post-procedure vital signs reviewed and stable Respiratory status: spontaneous breathing, nonlabored ventilation, respiratory function stable and patient connected to nasal cannula oxygen Cardiovascular status: blood pressure returned to baseline and stable Postop Assessment: no apparent nausea or vomiting Anesthetic complications: no     Last Vitals:  Vitals:   07/13/17 0824 07/13/17 0842  BP: 113/71 114/75  Pulse: 95 (!) 107  Resp: 16 18  Temp: 36.7 C 36.5 C  SpO2: 97% 100%    Last Pain:  Vitals:   07/13/17 0842  TempSrc: Oral  PainSc: 8                  Lenard SimmerAndrew Valor Quaintance

## 2017-07-13 NOTE — ED Notes (Signed)
Pt to the ER for cramping and bleeding. Pt is approx [redacted] weeks pregnant. Pt states she felt a pop at home and then a gush of blood and pt continues to bleed. Pt is having cramping intermittently and actively bleeding. Pt states she passed the fetus at home. She saw the tissue and said it was obvious. Pt has no prior hx of OB difficulty. Pt is G3P2. Pt

## 2017-07-13 NOTE — Anesthesia Procedure Notes (Signed)
Procedure Name: Intubation Date/Time: 07/13/2017 7:01 AM Performed by: Dava NajjarFrazier, Hadasa Gasner, CRNA Pre-anesthesia Checklist: Patient identified, Emergency Drugs available, Suction available, Patient being monitored and Timeout performed Patient Re-evaluated:Patient Re-evaluated prior to induction Oxygen Delivery Method: Circle system utilized Preoxygenation: Pre-oxygenation with 100% oxygen Induction Type: IV induction Laryngoscope Size: Miller and 2 Grade View: Grade I Tube type: Oral Tube size: 7.0 mm Number of attempts: 1 Airway Equipment and Method: Stylet Placement Confirmation: ETT inserted through vocal cords under direct vision,  positive ETCO2 and breath sounds checked- equal and bilateral Secured at: 21 cm Tube secured with: Tape Dental Injury: Teeth and Oropharynx as per pre-operative assessment

## 2017-07-13 NOTE — ED Notes (Signed)
Dr Scotty CourtStafford at bedside for pelvic exam, [facility]

## 2017-07-13 NOTE — Op Note (Signed)
Operative Report Dilation and Evacuation   Indications: 21yo G3P2 with incomplete abortion and vaginal bleeding  Pre-operative Diagnosis: incomplete abortion  Post-operative Diagnosis: same.  Procedure: Suction D&E  Surgeon: Ranae Plumberhelsea Benjy Kana, MD  Assistant(s):  None  Anesthesia: General endotracheal anesthesia  Intraoperative medications: 0.2mg  IM methergine, 400mcg rectal cytotec, 10u pitocin IV, 100mg  doxycycline IV  Estimated Blood Loss:  700cc         Total IV Fluids: 1000ml  Urine Output: 70ml         Findings: Uterus measuring 10 weeks; normal cervix, vagina, perineum.   Specimens: products of conception         Complications:  None; patient tolerated the procedure well.         Disposition: PACU - hemodynamically stable.         Condition: stable  Indication for procedure/Consents: 21 y.o. here for scheduled surgery for the aforementioned diagnoses.  Risks of surgery were discussed with the patient including but not limited to: bleeding which may require transfusion; infection which may require antibiotics; injury to uterus or surrounding organs; intrauterine scarring which may impair future fertility; need for additional procedures including laparotomy or laparoscopy; and other postoperative/anesthesia complications. Written informed consent was obtained.     Procedure in Detail:  The patient was then taken to the operating room where anesthesia was administered and was found to be adequate.  After a formal timeout was performed, she was placed in the dorsal lithotomy position and examined with the above findings. She was then prepped and draped in the sterile manner.  A speculum was then placed in the patient's vagina and a ringed forceps was applied to the anterior lip of the cervix.  The cervix was already dilated and could accommodate the 8 french suction curette. Tissue was removed after several passes.  A sharp curettage was then performed until there was a gritty  texture in all four quadrants. The specimen(s) was handed off to nursing. Manual massage was applied for 2 full minutes, and bleeding was reduced. The forceps was removed from the anterior lip of the cervix and the vaginal speculum was removed after noting good hemostasis. The patient tolerated the procedure well and was taken to the recovery area awake, extubated and in stable condition.  The patient will be discharged to home as per PACU criteria.  Routine postoperative instructions given. She will follow up in the clinic in two to four weeks for postoperative evaluation.  Ranae Plumberhelsea Kimiko Common, MD Gynecologist and Elenor Legatobstetrician [facility]

## 2017-07-13 NOTE — ED Provider Notes (Signed)
[facility] Emergency Department Provider Note  ____________________________________________  Time seen: Approximately 6:27 AM  I have reviewed the triage vital signs and the nursing notes.   HISTORY  Chief Complaint Vaginal Bleeding    HPI Terri [name] is a 21 y.o. female about [redacted] weeks pregnant with her third pregnancy reports that she woke up this morning about 4 AM with cramping abdominal pain. She went to the bathroom and passed what clearly looks like a fetus to her. She'll back to bed was subsequently was having severe worsening cramping pelvic pain and vaginal bleeding. Symptoms continue to worsen so she comes to the ED. She reports feeling dizzy, but no chest pain shortness of breath fevers chills or sweats. Prior to this, no urinary symptoms vaginal discharge or other unusual symptoms. She has not had prenatal care during this pregnancy so far but denies any obvious complications. Pain is not radiating, no aggravating/alleviating factors.     History reviewed. No pertinent past medical history.   There are no active problems to display for this patient.    History reviewed. No pertinent surgical history.   Prior to Admission medications   Medication Sig Start Date End Date Taking? Authorizing Provider  cyclobenzaprine (FLEXERIL) 5 MG tablet Take 1-2 tablets (5-10 mg total) by mouth 3 (three) times daily as needed for muscle spasms. Patient not taking: Reported on 07/04/2017 07/07/16   Evon SlackGaines, Thomas C, PA-C  metoCLOPramide (REGLAN) 10 MG tablet Take 1 tablet (10 mg total) every 8 (eight) hours as needed by mouth. 07/04/17   Rebecka ApleyWebster, Allison P, MD  predniSONE (DELTASONE) 10 MG tablet Take 1 tablet (10 mg total) by mouth daily. 6,5,4,3,2,1 six day taper Patient not taking: Reported on 07/04/2017 07/07/16   Evon SlackGaines, Thomas C, PA-C  traMADol (ULTRAM) 50 MG tablet Take 1 tablet (50 mg total) by mouth every 6 (six) hours as needed. Patient not  taking: Reported on 07/04/2017 07/07/16   Evon SlackGaines, Thomas C, PA-C     Allergies Zithromax [azithromycin]   No family history on file.  Social History Social History   Tobacco Use  . Smoking status: Never Smoker  . Smokeless tobacco: Never Used  Substance Use Topics  . Alcohol use: No  . Drug use: No    Review of Systems  Constitutional:   No fever or chills.  Cardiovascular:   No chest pain or syncope. Respiratory:   No dyspnea or cough. Gastrointestinal:   Positive cramping pelvic pain. No vomiting or diarrhea.  Musculoskeletal:   Negative for focal pain or swelling All other systems reviewed and are negative except as documented above in ROS and HPI.  ____________________________________________   PHYSICAL EXAM:  VITAL SIGNS: ED Triage Vitals  Enc Vitals Group     BP 07/13/17 0518 (!) 144/95     Pulse Rate 07/13/17 0518 100     Resp 07/13/17 0518 20     Temp 07/13/17 0518 98.2 F (36.8 C)     Temp Source 07/13/17 0518 Tympanic     SpO2 07/13/17 0518 98 %     Weight 07/13/17 0519 220 lb (99.8 kg)     Height 07/13/17 0519 5\' 3"  (1.6 m)     Head Circumference --      Peak Flow --      Pain Score 07/13/17 0518 9     Pain Loc --      Pain Edu? --      Excl. in GC? --  Vital signs reviewed, nursing assessments reviewed.   Constitutional:   Alert and oriented. Uncomfortable, not in distress Eyes:   No scleral icterus.  EOMI. No nystagmus. No conjunctival pallor. PERRL. ENT   Head:   Normocephalic and atraumatic.   Nose:   No congestion/rhinnorhea.    Mouth/Throat:   MMM, no pharyngeal erythema. No peritonsillar mass.    Neck:   No meningismus. Full ROM. Hematological/Lymphatic/Immunilogical:   No cervical lymphadenopathy. Cardiovascular:   Tachycardia heart rate 120. Symmetric bilateral radial and DP pulses.  No murmurs.  Respiratory:   Normal respiratory effort without tachypnea/retractions. Breath sounds are clear and equal bilaterally. No  wheezes/rales/rhonchi. Gastrointestinal:   Soft with suprapubic tenderness. Non distended. There is no CVA tenderness.  No rebound, rigidity, or guarding. Genitourinary:   Pelvic exam performed with nurse Sherie at bedside.  Blood at the labia. Speculum exam reveals a large pool of blood and clots in the vault. Bimanual exam finds a closed external cervical os. After removing clots and blood as much as possible, there is persistent brisk bleeding. Not improved with bimanual uterine massage. Musculoskeletal:   Normal range of motion in all extremities. No joint effusions.  No lower extremity tenderness.  No edema. Neurologic:   Normal speech and language.  Motor grossly intact. No gross focal neurologic deficits are appreciated.  Skin:    Skin is warm, dry and intact. No rash noted.  No petechiae, purpura, or bullae.  ____________________________________________    LABS (pertinent positives/negatives) (all labs ordered are listed, but only abnormal results are displayed) Labs Reviewed  CBC - Abnormal; Notable for the following components:      Result Value   WBC 23.1 (*)    All other components within normal limits  COMPREHENSIVE METABOLIC PANEL - Abnormal; Notable for the following components:   Potassium 3.0 (*)    CO2 20 (*)    Glucose, Bld 115 (*)    Creatinine, Ser 0.35 (*)    Calcium 8.7 (*)    Albumin 3.1 (*)    AST 14 (*)    ALT 11 (*)    All other components within normal limits  HCG, QUANTITATIVE, PREGNANCY  ABO/RH  TYPE AND SCREEN   ____________________________________________   EKG    ____________________________________________    RADIOLOGY  No results found.  ____________________________________________   PROCEDURES Procedures  ____________________________________________   DIFFERENTIAL DIAGNOSIS  Retained products of conception, incomplete abortion, postpartum hemorrhage  CLINICAL IMPRESSION / ASSESSMENT AND PLAN / ED COURSE  Pertinent labs  & imaging results that were available during my care of the patient were reviewed by me and considered in my medical decision making (see chart for details).     Clinical Course as of Jul 13 626  Tue Jul 13, 2017  [id number] D/w ob Dr. Elesa MassedWard at ~ 5:30am. Requests TXA infusion, will eval in ED for possible D&C.  [PS]    Clinical Course User Index [PS] Sharman CheekStafford, Dinero Chavira, MD     ----------------------------------------- 6:29 AM on 07/13/2017 -----------------------------------------  Discussed with Dr. Elesa MassedWard after her evaluation. Plan to go to the OR for D&C. Currently hemodynamically stable. Received TXA infusion. Tachycardia improved with IV fluids.  ____________________________________________   FINAL CLINICAL IMPRESSION(S) / ED DIAGNOSES    Final diagnoses:  Abortion complicated by delayed or excessive hemorrhage  Vaginal bleeding      This SmartLink is deprecated. Use AVSMEDLIST instead to display the medication list for a patient.   Portions of this note were generated with dragon dictation  software. Dictation errors may occur despite best attempts at proofreading.    Sharman Cheek, MD 07/13/17 [phone]

## 2017-07-13 NOTE — Anesthesia Preprocedure Evaluation (Signed)
Anesthesia Evaluation  Patient identified by MRN, date of birth, ID band Patient awake    Reviewed: Allergy & Precautions, H&P , NPO status , Patient's Chart, lab work & pertinent test results  Airway Mallampati: III  TM Distance: <3 FB Neck ROM: full    Dental  (+) Chipped   Pulmonary neg shortness of breath, sleep apnea (as a child) ,           Cardiovascular Exercise Tolerance: Good (-) hypertension(-) angina(-) Past MI and (-) DOE negative cardio ROS       Neuro/Psych negative neurological ROS  negative psych ROS   GI/Hepatic negative GI ROS, Neg liver ROS, neg GERD  ,  Endo/Other  negative endocrine ROS  Renal/GU      Musculoskeletal   Abdominal   Peds  Hematology negative hematology ROS (+)   Anesthesia Other Findings Bleeding with incomplete abortion   History reviewed. No pertinent surgical history.  BMI    Body Mass Index:  38.97 kg/m      Reproductive/Obstetrics negative OB ROS                             Anesthesia Physical Anesthesia Plan  ASA: III and emergent  Anesthesia Plan: General ETT   Post-op Pain Management:    Induction: Intravenous  PONV Risk Score and Plan: 4 or greater and Ondansetron, Dexamethasone and Midazolam  Airway Management Planned: Oral ETT  Additional Equipment:   Intra-op Plan:   Post-operative Plan: Extubation in OR  Informed Consent: I have reviewed the patients History and Physical, chart, labs and discussed the procedure including the risks, benefits and alternatives for the proposed anesthesia with the patient or authorized representative who has indicated his/her understanding and acceptance.   Dental Advisory Given  Plan Discussed with: Anesthesiologist, CRNA and Surgeon  Anesthesia Plan Comments: (Patient consented for risks of anesthesia including but not limited to:  - adverse reactions to medications - damage to  teeth, lips or other oral mucosa - sore throat or hoarseness - Damage to heart, brain, lungs or loss of life  Patient voiced understanding.)        Anesthesia Quick Evaluation

## 2017-07-13 NOTE — H&P (Addendum)
Consult History and Physical   SERVICE: Gynecology   Patient Name: Terri [name] Patient MRN:   [id number]  CC: vaginal bleeding  HPI: Terri [name] is a 21 y.o. G3P2 with known pregnancy and had not yet established prenatal care, and preparing to move in the next day to [place] (and planned to establish care there). She presents to the ED with story of feeling a "pop" at home, then developing cramping and bleeding and passing a very clearly formed fetus in the bathroom/toilet.  No large clots or tissue was passed with this fetus.  She then continued to bleed heavily.   Patient's last menstrual period was 05/02/2017. Estimated Date of Delivery: 02/06/18 Estimated gestational age: 6197w2d  Review of Systems: positives in bold GEN:   fevers, chills, weight changes, appetite changes, fatigue, night sweats HEENT:  HA, vision changes, hearing loss, congestion, rhinorrhea, sinus pressure, dysphagia CV:   CP, palpitations PULM:  SOB, cough GI:  abd pain, N/V/D/C GU:  dysuria, urgency, frequency MSK:  arthralgias, myalgias, back pain, swelling SKIN:  rashes, color changes, pallor NEURO:  numbness, weakness, tingling, seizures, dizziness, tremors PSYCH:  depression, anxiety, behavioral problems, confusion  HEME/LYMPH:  easy bruising or bleeding ENDO:  heat/cold intolerance  Past Obstetrical History: OB History    Gravida Para Term Preterm AB Living   3 2           SAB TAB Ectopic Multiple Live Births                  Past Gynecologic History: Patient's last menstrual period was 05/02/2017. Menstrual frequency - Q monthly.    Past Medical History: History reviewed. No pertinent past medical history. - obesity  Past Surgical History:  History reviewed. No pertinent surgical history. - none  Family History:   family history is not on file. - no gyn cancers, no bleeding or clotting disorders  Social History:  Social History   Socioeconomic History  . Marital  status: Single    Spouse name: Not on file  . Number of children: Not on file  . Years of education: Not on file  . Highest education level: Not on file  Social Needs  . Financial resource strain: Not on file  . Food insecurity - worry: Not on file  . Food insecurity - inability: Not on file  . Transportation needs - medical: Not on file  . Transportation needs - non-medical: Not on file  Occupational History  . Not on file  Tobacco Use  . Smoking status: Never Smoker  . Smokeless tobacco: Never Used  Substance and Sexual Activity  . Alcohol use: No  . Drug use: No  . Sexual activity: Not on file  Other Topics Concern  . Not on file  Social History Narrative  . Not on file    Home Medications:  Medications reconciled in EPIC  No current facility-administered medications on file prior to encounter.    Current Outpatient Medications on File Prior to Encounter  Medication Sig Dispense Refill  . cyclobenzaprine (FLEXERIL) 5 MG tablet Take 1-2 tablets (5-10 mg total) by mouth 3 (three) times daily as needed for muscle spasms. (Patient not taking: Reported on 07/04/2017) 20 tablet 0  . metoCLOPramide (REGLAN) 10 MG tablet Take 1 tablet (10 mg total) every 8 (eight) hours as needed by mouth. 20 tablet 0  . predniSONE (DELTASONE) 10 MG tablet Take 1 tablet (10 mg total) by mouth daily. 6,5,4,3,2,1 six day taper (Patient not  taking: Reported on 07/04/2017) 21 tablet 0  . traMADol (ULTRAM) 50 MG tablet Take 1 tablet (50 mg total) by mouth every 6 (six) hours as needed. (Patient not taking: Reported on 07/04/2017) 20 tablet 0    Allergies:  Allergies  Allergen Reactions  . Zithromax [Azithromycin]     Physical Exam:  Temp:  [98.2 F (36.8 C)] 98.2 F (36.8 C) (11/13 0518) Pulse Rate:  [95-124] 95 (11/13 0600) Resp:  [10-20] 10 (11/13 0600) BP: (117-144)/(75-95) 117/75 (11/13 0600) SpO2:  [95 %-99 %] 99 % (11/13 0600) Weight:  [99.8 kg (220 lb)] 99.8 kg (220 lb) (11/13  0519) Body mass index is 38.97 kg/m.   General Appearance:  Well developed, well nourished, no acute distress, alert and oriented, cooperative and appears stated age HEENT:  Normocephalic atraumatic, extraocular movements intact, moist mucous membranes, neck supple with midline trachea and thyroid without masses Cardiovascular:  Normal S1/S2, regular rate and rhythm, no murmurs, 2+ distal pulses Pulmonary:  clear to auscultation, no wheezes, rales or rhonchi, symmetric air entry, good air exchange Abdomen:  Bowel sounds present, soft, nontender, nondistended, no abnormal masses or organomegaly, no epigastric pain Extremities:  extremities normal, no tenderness, atraumatic, no cyanosis or edema Skin:  normal coloration and turgor, no rashes, no suspicious skin lesions noted  Neurologic:  Cranial nerves 2-12 grossly intact, grossly equal strength and muscle tone, normal speech, no focal findings or movement disorder noted. Psychiatric:  Normal mood and affect, appropriate, no AH/VH Pelvic:  NEFG, no vulvar masses or lesions, normal vaginal mucosa, large blood clots within vault, some membranous tissue from cervical os, teased out, cervix without lesions or erythema, minimally dilated, tender upon manipulation.    Labs/Studies:   Results for orders placed or performed during the hospital encounter of 07/13/17 (from the past 24 hour(s))  CBC     Status: Abnormal   Collection Time: 07/13/17  5:20 AM  Result Value Ref Range   WBC 23.1 (H) 3.6 - 11.0 K/uL   RBC 4.53 3.80 - 5.20 MIL/uL   Hemoglobin 12.9 12.0 - 16.0 g/dL   HCT 11.9 14.7 - 82.9 %   MCV 81.2 80.0 - 100.0 fL   MCH 28.6 26.0 - 34.0 pg   MCHC 35.2 32.0 - 36.0 g/dL   RDW 56.2 13.0 - 86.5 %   Platelets 189 150 - 440 K/uL  Comprehensive metabolic panel     Status: Abnormal   Collection Time: 07/13/17  5:20 AM  Result Value Ref Range   Sodium 135 135 - 145 mmol/L   Potassium 3.0 (L) 3.5 - 5.1 mmol/L   Chloride 104 101 - 111 mmol/L    CO2 20 (L) 22 - 32 mmol/L   Glucose, Bld 115 (H) 65 - 99 mg/dL   BUN 6 6 - 20 mg/dL   Creatinine, Ser 7.84 (L) 0.44 - 1.00 mg/dL   Calcium 8.7 (L) 8.9 - 10.3 mg/dL   Total Protein 7.0 6.5 - 8.1 g/dL   Albumin 3.1 (L) 3.5 - 5.0 g/dL   AST 14 (L) 15 - 41 U/L   ALT 11 (L) 14 - 54 U/L   Alkaline Phosphatase 83 38 - 126 U/L   Total Bilirubin 0.6 0.3 - 1.2 mg/dL   GFR calc non Af Amer >60 >60 mL/min   GFR calc Af Amer >60 >60 mL/min   Anion gap 11 5 - 15  ABO/Rh     Status: None (Preliminary result)   Collection Time: 07/13/17  5:20 AM  Result Value Ref Range   ABO/RH(D) PENDING   Type and screen [facility]     Status: None   Collection Time: 07/13/17  5:25 AM  Result Value Ref Range   ABO/RH(D) O POS    Antibody Screen NEG    Sample Expiration 07/16/2017   Beta HCG:  94,217  Imaging deferred due to bleeding.  Assessment / Plan:   Terri [name] is a 21 y.o. G3P2 with incomplete abortion.  1. Minimal tissue extracted from cervical os, leaving likely significant amount.   2. TXA was administered by my request in the ED upon notification of patient in house.  Bleeding has lessened since then.  Still trickle from the os during exam, however.  3. Offered options of cytotec vs D&E in OR and patient elected OR due to her family/moving situation.  Consents were signed by me and OR was notified.  Ordered doxcycline IV an notified pharmacy, confirmed allergies.    Patient has been NPO since last night.  OR staff and anesthesia notified.   Thank you for the opportunity to be involved with this patient's care.  ----- Ranae Plumberhelsea Mujtaba Bollig, MD Attending Obstetrician and Gynecologist [facility], Department of OB/GYN [facility]

## 2017-07-14 LAB — SURGICAL PATHOLOGY

## 2018-01-05 ENCOUNTER — Emergency Department
Admission: EM | Admit: 2018-01-05 | Discharge: 2018-01-05 | Disposition: A | Discharge status: Home / Self Care | Payer: Self-pay | Attending: Emergency Medicine | Admitting: Emergency Medicine

## 2018-01-05 ENCOUNTER — Encounter: Payer: Self-pay | Admitting: Emergency Medicine

## 2018-01-05 ENCOUNTER — Other Ambulatory Visit: Payer: Self-pay

## 2018-01-05 DIAGNOSIS — Z79899 Other long term (current) drug therapy: Secondary | ICD-10-CM | POA: Insufficient documentation

## 2018-01-05 DIAGNOSIS — K047 Periapical abscess without sinus: Secondary | ICD-10-CM | POA: Insufficient documentation

## 2018-01-05 MED ORDER — AMOXICILLIN 500 MG PO CAPS
500.0000 mg | ORAL_CAPSULE | Freq: Once | ORAL | Status: AC
  Administered 2018-01-05: 500 mg via ORAL
  Filled 2018-01-05: qty 1

## 2018-01-05 MED ORDER — AMOXICILLIN 500 MG PO TABS: 500.0000 mg | ORAL_TABLET | Freq: Three times a day (TID) | ORAL | 0 refills | Status: DC

## 2018-01-05 NOTE — ED Triage Notes (Signed)
Patient here with left side of face swelling, cavity left front tooth.  States swelling started yesterday.  States she broke the tooth after a tongue piercing.

## 2018-01-05 NOTE — ED Provider Notes (Signed)
[facility] Emergency Department Provider Note ____________________________________________  Time seen: Approximately 9:04 AM  I have reviewed the triage vital signs and the nursing notes.   HISTORY  Chief Complaint Oral Swelling   HPI Terri [name] is a 22 y.o. female who presents to the emergency department for treatment of acute on chronic dental pain. Swelling and pain started again yesterday. She ist taking naprosyn with some relief. Unable to afford to see the dentist. No fever, nausea, or other complaints.   History reviewed. No pertinent past medical history.  There are no active problems to display for this patient.   Past Surgical History:  Procedure Laterality Date  . DILATION AND EVACUATION N/A 07/13/2017   Procedure: DILATATION AND EVACUATION;  Surgeon: Ward, Elenora Fender, MD;  Location: [facility] ORS;  Service: Gynecology;  Laterality: N/A;    Prior to Admission medications   Medication Sig Start Date End Date Taking? Authorizing Provider  amoxicillin (AMOXIL) 500 MG tablet Take 1 tablet (500 mg total) by mouth 3 (three) times daily. 01/05/18   Sherial Ebrahim, Rulon Eisenmenger B, FNP  cyclobenzaprine (FLEXERIL) 5 MG tablet Take 1-2 tablets (5-10 mg total) by mouth 3 (three) times daily as needed for muscle spasms. Patient not taking: Reported on 07/04/2017 07/07/16   Evon Slack, PA-C  doxycycline (ADOXA) 100 MG tablet Take 1 tablet (100 mg total) 2 (two) times daily by mouth. 07/13/17   Ward, Elenora Fender, MD  ibuprofen (ADVIL,MOTRIN) 800 MG tablet Take 1 tablet (800 mg total) every 8 (eight) hours as needed by mouth. 07/13/17   Ward, Elenora Fender, MD  metoCLOPramide (REGLAN) 10 MG tablet Take 1 tablet (10 mg total) every 8 (eight) hours as needed by mouth. 07/04/17   Rebecka Apley, MD  predniSONE (DELTASONE) 10 MG tablet Take 1 tablet (10 mg total) by mouth daily. 6,5,4,3,2,1 six day taper Patient not taking: Reported on 07/04/2017 07/07/16   Evon Slack, PA-C   traMADol (ULTRAM) 50 MG tablet Take 1 tablet (50 mg total) by mouth every 6 (six) hours as needed. Patient not taking: Reported on 07/04/2017 07/07/16   Evon Slack, PA-C    Allergies Zithromax [azithromycin]  No family history on file.  Social History Social History   Tobacco Use  . Smoking status: Never Smoker  . Smokeless tobacco: Never Used  Substance Use Topics  . Alcohol use: No  . Drug use: No    Review of Systems Constitutional: Negative for fever or chills. ENT: Positive for dental pain. Musculoskeletal: Negative for trismus  Skin: Positive for facial swelling on the left side ____________________________________________   PHYSICAL EXAM:  VITAL SIGNS: ED Triage Vitals  Enc Vitals Group     BP 01/05/18 0829 127/90     Pulse Rate 01/05/18 0829 86     Resp 01/05/18 0829 18     Temp 01/05/18 0829 98.7 F (37.1 C)     Temp Source 01/05/18 0829 Oral     SpO2 01/05/18 0829 100 %     Weight 01/05/18 0836 223 lb (101.2 kg)     Height 01/05/18 0836  (1.6 m)     Head Circumference --      Peak Flow --      Pain Score 01/05/18 0835 8     Pain Loc --      Pain Edu? --      Excl. in GC? --     Constitutional: Alert and oriented. Well appearing and in no acute  distress. Eyes: Conjunctiva are normal Mouth/Throat: Airway is patent. Periodontal Exam    Hematological/Lymphatic/Immunilogical: No adenopathy palpable Respiratory: Respirations are even and unlabored. Musculoskeletal: Full ROM of jaw. Neurologic: Awake, alert, oriented  Skin: Left side facial swelling without erythema. No suspected ludwigs Psychiatric: Affect and behavior are normal.  ____________________________________________   LABS (all labs ordered are listed, but only abnormal results are displayed)  Labs Reviewed - No data to display ____________________________________________   RADIOLOGY  Not  indicated. ____________________________________________   PROCEDURES  Procedure(s) performed:   Procedures  Critical Care performed: No ____________________________________________   INITIAL IMPRESSION / ASSESSMENT AND PLAN / ED COURSE  Terri [name] is a 22 y.o. female who presents to the emergency department for treatment and evaluation of dental pain. She will be treated with Amoxicillin and continue the naprosyn. She is to schedule an appointment with the dentist as soon as possible. She was advised to return to the ER for symptoms that change or worsen if unable to schedule an appointment.  Pertinent labs & imaging results that were available during my care of the patient were reviewed by me and considered in my medical decision making (see chart for details).  ____________________________________________   FINAL CLINICAL IMPRESSION(S) / ED DIAGNOSES  Final diagnoses:  Dental abscess    New Prescriptions   AMOXICILLIN (AMOXIL) 500 MG TABLET    Take 1 tablet (500 mg total) by mouth 3 (three) times daily.    If controlled substance prescribed during this visit, 12 month history viewed on the [facility] prior to issuing an initial prescription for Schedule II or III opiod.  Note:  This document was prepared using Dragon voice recognition software and may include unintentional dictation errors.    Chinita Pester, FNP 01/05/18 1610    Emily Filbert, MD 01/05/18 [phone]

## 2018-01-05 NOTE — ED Notes (Signed)
See triage note  Presents with swelling to left side of face  Stats she has a broken tooth

## 2018-01-05 NOTE — Discharge Instructions (Signed)
Please call and schedule a dental appointment as soon as possible. You will need to be seen within the next 14 days. Return to the emergency department for symptoms that change or worsen if you're unable to schedule an appointment.  OPTIONS FOR DENTAL FOLLOW UP CARE  [place] Department [facility] Health and Human Services - Local Safety Net Dental Clinics http://www.ncdhhs.gov/dph/oralhealth/services/safetynetclinics.htm   [facility] ([phone])  [facility] ([phone])  [facility] ([phone])  [facility] ([phone])  [facility] ([phone]) This clinic caters to the indigent population and is on a lottery system. Location: [facility], [address], [place] Clinic Hours: Wednesdays from 6pm - 9pm, patients seen by a lottery system. For dates, call or go to [email or web address].[facility].edu/[facility]/patients/Dental-[facility] Services: Cleanings, fillings and simple extractions. Payment Options: DENTAL WORK IS FREE OF CHARGE. Bring proof of income or support. Best way to get seen: Arrive at 5:15 pm - this is a lottery, NOT first come/first serve, so arriving earlier will not increase your chances of being seen.     [facility] [phone] Select option 1 for emergencies   Location: [facility], [address], [place] Clinic Hours: No walk-ins accepted - call the day before to schedule an appointment. Check in times are 9:30 am and 1:30 pm. Services: Simple extractions, temporary fillings, pulpectomy/pulp debridement, uncomplicated abscess drainage. Payment Options: PAYMENT IS DUE AT THE TIME OF SERVICE.  Fee is usually $100-200, additional surgical procedures (e.g. abscess drainage) may be extra. Cash, checks, Visa/MasterCard accepted.  Can file Medicaid if patient is covered for dental - patient should call case worker to check. No  discount for [facility] patients. Best way to get seen: MUST call the day before and get onto the schedule. Can usually be seen the next 1-2 days. No walk-ins accepted.     [facility] [phone]   Location: [facility], [address], [place] Clinic Hours: M, W, Th, F 8am or 1:30pm, Tues 9a or 1:30 - first come/first served. Services: Simple extractions, temporary fillings, uncomplicated abscess drainage.  You do not need to be an [place] resident. Payment Options: PAYMENT IS DUE AT THE TIME OF SERVICE. Dental insurance, otherwise sliding scale - bring proof of income or support. Depending on income and treatment needed, cost is usually $50-200. Best way to get seen: Arrive early as it is first come/first served.     [facility] [phone]   Location: [address] Clinic Hours: Mon-Thu 8a-5p Services: Most basic dental services including extractions and fillings. Payment Options: PAYMENT IS DUE AT THE TIME OF SERVICE. Sliding scale, up to 50% off - bring proof if income or support. Medicaid with dental option accepted. Best way to get seen: Call to schedule an appointment, can usually be seen within 2 weeks OR they will try to see walk-ins - show up at 8a or 2p (you may have to wait).     [facility] [phone] [place] RESIDENTS ONLY   Location: [facility], [address], [place], [place] [address] Clinic Hours: By appointment only. Monday - Thursday 8am-5pm, Friday 8am-12pm Services: Cleanings, fillings, extractions. Payment Options: PAYMENT IS DUE AT THE TIME OF SERVICE. Cash, Visa or MasterCard. Sliding scale - $30 minimum per service. Best way to get seen: Come in to office, complete packet and make an appointment -   need proof of income or support monies for each household member and proof of [place]  residence. Usually takes about a month to get in.     [facility] [phone]   Location: [address] [place] Clinic Hours: Walk-in Urgent Care Dental Services are offered Monday-Friday mornings only. The numbers of emergencies accepted daily is limited to the number of providers available. Maximum 15 - Mondays, Wednesdays & Thursdays Maximum 10 - Tuesdays & Fridays Services: You do not need to be a [place] resident to be seen for a dental emergency. Emergencies are defined as pain, swelling, abnormal bleeding, or dental trauma. Walkins will receive x-rays if needed. NOTE: Dental cleaning is not an emergency. Payment Options: PAYMENT IS DUE AT THE TIME OF SERVICE. Minimum co-pay is $40.00 for uninsured patients. Minimum co-pay is $3.00 for Medicaid with dental coverage. Dental Insurance is accepted and must be presented at time of visit. Medicare does not cover dental. Forms of payment: Cash, credit card, checks. Best way to get seen: If not previously registered with the clinic, walk-in dental registration begins at 7:15 am and is on a first come/first serve basis. If previously registered with the clinic, call to make an appointment.     [facility] [phone] [place] RESIDENTS ONLY   Location: [address], [place], [place] Clinic Hours: Mon-Thu 10a-2p Services: Extractions only! Payment Options: FREE (donations accepted) - bring proof of income or support Best way to get seen: Call and schedule an appointment OR come at 8am on the 1st Monday of every month (except for holidays) when it is first come/first served.     [facility] [phone]   Location: [address], [place] Clinic Hours: Friday mornings Services, Payment Options, Best way to get seen: Call for info  

## 2018-04-22 ENCOUNTER — Emergency Department
Admission: EM | Admit: 2018-04-22 | Discharge: 2018-04-22 | Disposition: A | Discharge status: Home / Self Care | Payer: Self-pay | Attending: Emergency Medicine | Admitting: Emergency Medicine

## 2018-04-22 DIAGNOSIS — R197 Diarrhea, unspecified: Secondary | ICD-10-CM

## 2018-04-22 DIAGNOSIS — K047 Periapical abscess without sinus: Secondary | ICD-10-CM

## 2018-04-22 DIAGNOSIS — F17200 Nicotine dependence, unspecified, uncomplicated: Secondary | ICD-10-CM | POA: Insufficient documentation

## 2018-04-22 DIAGNOSIS — R109 Unspecified abdominal pain: Secondary | ICD-10-CM | POA: Insufficient documentation

## 2018-04-22 DIAGNOSIS — N3090 Cystitis, unspecified without hematuria: Secondary | ICD-10-CM

## 2018-04-22 LAB — COMPREHENSIVE METABOLIC PANEL
ALT: 15 U/L (ref 0–44)
AST: 21 U/L (ref 15–41)
Albumin: 3.9 g/dL (ref 3.5–5.0)
Alkaline Phosphatase: 62 U/L (ref 38–126)
Anion gap: 6 (ref 5–15)
BUN: 7 mg/dL (ref 6–20)
CALCIUM: 8.9 mg/dL (ref 8.9–10.3)
CHLORIDE: 105 mmol/L (ref 98–111)
CO2: 27 mmol/L (ref 22–32)
CREATININE: 0.57 mg/dL (ref 0.44–1.00)
Glucose, Bld: 114 mg/dL — ABNORMAL HIGH (ref 70–99)
Potassium: 3.7 mmol/L (ref 3.5–5.1)
Sodium: 138 mmol/L (ref 135–145)
Total Bilirubin: 0.6 mg/dL (ref 0.3–1.2)
Total Protein: 7.5 g/dL (ref 6.5–8.1)

## 2018-04-22 LAB — URINALYSIS, COMPLETE (UACMP) WITH MICROSCOPIC
BILIRUBIN URINE: NEGATIVE
Bacteria, UA: NONE SEEN
GLUCOSE, UA: NEGATIVE mg/dL
Hgb urine dipstick: NEGATIVE
Ketones, ur: 5 mg/dL — AB
Nitrite: NEGATIVE
PH: 5 (ref 5.0–8.0)
Protein, ur: NEGATIVE mg/dL
SPECIFIC GRAVITY, URINE: 1.016 (ref 1.005–1.030)

## 2018-04-22 LAB — CBC
HCT: 36.2 % (ref 35.0–47.0)
Hemoglobin: 12.9 g/dL (ref 12.0–16.0)
MCH: 27.7 pg (ref 26.0–34.0)
MCHC: 35.8 g/dL (ref 32.0–36.0)
MCV: 77.4 fL — AB (ref 80.0–100.0)
PLATELETS: 178 10*3/uL (ref 150–440)
RBC: 4.67 MIL/uL (ref 3.80–5.20)
RDW: 15.1 % — AB (ref 11.5–14.5)
WBC: 9.1 10*3/uL (ref 3.6–11.0)

## 2018-04-22 LAB — LIPASE, BLOOD: LIPASE: 19 U/L (ref 11–51)

## 2018-04-22 LAB — POCT PREGNANCY, URINE: Preg Test, Ur: NEGATIVE

## 2018-04-22 MED ORDER — METOCLOPRAMIDE HCL 10 MG PO TABS: 10.0000 mg | ORAL_TABLET | Freq: Three times a day (TID) | ORAL | 0 refills | Status: DC

## 2018-04-22 MED ORDER — LIDOCAINE VISCOUS HCL 2 % MT SOLN
15.0000 mL | Freq: Once | OROMUCOSAL | Status: AC
  Administered 2018-04-22: 15 mL via OROMUCOSAL
  Filled 2018-04-22: qty 15

## 2018-04-22 MED ORDER — ONDANSETRON 4 MG PO TBDP
4.0000 mg | ORAL_TABLET | Freq: Once | ORAL | Status: AC
  Administered 2018-04-22: 4 mg via ORAL
  Filled 2018-04-22: qty 1

## 2018-04-22 MED ORDER — LIDOCAINE VISCOUS HCL 2 % MT SOLN: 10.0000 mL | OROMUCOSAL | 0 refills | Status: DC | PRN

## 2018-04-22 MED ORDER — AMOXICILLIN 500 MG PO CAPS: 500.0000 mg | ORAL_CAPSULE | Freq: Three times a day (TID) | ORAL | 0 refills | Status: DC

## 2018-04-22 MED ORDER — SULFAMETHOXAZOLE-TRIMETHOPRIM 800-160 MG PO TABS: 1.0000 | ORAL_TABLET | Freq: Two times a day (BID) | ORAL | 0 refills | Status: AC

## 2018-04-22 NOTE — ED Notes (Signed)
Patient given water and encouraged to drink per PA order

## 2018-04-22 NOTE — ED Notes (Signed)
Reviewed discharge instructions, follow-up care, and prescriptions with patient. Patient verbalized understanding of all information reviewed. Patient stable, with no distress noted at this time.    

## 2018-04-22 NOTE — ED Provider Notes (Signed)
[facility] Emergency Department Provider Note  ____________________________________________  Time seen: Approximately 9:12 PM  I have reviewed the triage vital signs and the nursing notes.   HISTORY  Chief Complaint Abdominal Pain and Dental Pain    HPI Terri [name] is a 22 y.o. female that presents emergency department for evaluation of left frontal dental pain for 1 day and epigastric pain on and off and diarrhea today.  Patient has not seen a dentist in many years.  Abdominal pain does not radiate.  Pain has comes and goes.  She denies any pain currently.  She has had several episodes of diarrhea today.  No blood or mucus in diarrhea.  She has not had very much to eat today.  No fever, chills, vomiting, back pain, urinary symptoms.  History reviewed. No pertinent past medical history.  There are no active problems to display for this patient.   Past Surgical History:  Procedure Laterality Date  . DILATION AND EVACUATION N/A 07/13/2017   Procedure: DILATATION AND EVACUATION;  Surgeon: Ward, Elenora Fender, MD;  Location: [facility] ORS;  Service: Gynecology;  Laterality: N/A;    Prior to Admission medications   Medication Sig Start Date End Date Taking? Authorizing Provider  amoxicillin (AMOXIL) 500 MG capsule Take 1 capsule (500 mg total) by mouth 3 (three) times daily. 04/22/18   Enid Derry, PA-C  cyclobenzaprine (FLEXERIL) 5 MG tablet Take 1-2 tablets (5-10 mg total) by mouth 3 (three) times daily as needed for muscle spasms. Patient not taking: Reported on 07/04/2017 07/07/16   Evon Slack, PA-C  doxycycline (ADOXA) 100 MG tablet Take 1 tablet (100 mg total) 2 (two) times daily by mouth. 07/13/17   Ward, Elenora Fender, MD  ibuprofen (ADVIL,MOTRIN) 800 MG tablet Take 1 tablet (800 mg total) every 8 (eight) hours as needed by mouth. 07/13/17   Ward, Elenora Fender, MD  lidocaine (XYLOCAINE) 2 % solution Use as directed 10 mLs in the mouth or throat as needed  for mouth pain. 04/22/18   Enid Derry, PA-C  metoCLOPramide (REGLAN) 10 MG tablet Take 1 tablet (10 mg total) by mouth 3 (three) times daily with meals. 04/22/18 04/22/19  Enid Derry, PA-C  predniSONE (DELTASONE) 10 MG tablet Take 1 tablet (10 mg total) by mouth daily. 6,5,4,3,2,1 six day taper Patient not taking: Reported on 07/04/2017 07/07/16   Evon Slack, PA-C  sulfamethoxazole-trimethoprim (BACTRIM DS,SEPTRA DS) 800-160 MG tablet Take 1 tablet by mouth 2 (two) times daily for 7 days. 04/22/18 04/29/18  Enid Derry, PA-C  traMADol (ULTRAM) 50 MG tablet Take 1 tablet (50 mg total) by mouth every 6 (six) hours as needed. Patient not taking: Reported on 07/04/2017 07/07/16   Evon Slack, PA-C    Allergies Zithromax [azithromycin]  No family history on file.  Social History Social History   Tobacco Use  . Smoking status: Current Every Day Smoker  . Smokeless tobacco: Never Used  Substance Use Topics  . Alcohol use: No  . Drug use: No     Review of Systems  Constitutional: No fever/chills ENT: No upper respiratory complaints. Cardiovascular: No chest pain. Respiratory: No cough. No SOB. Gastrointestinal: Positive for abdominal pain. No nausea, no vomiting.  Musculoskeletal: Negative for musculoskeletal pain. Skin: Negative for rash, abrasions, lacerations, ecchymosis. Neurological: Negative for headaches, numbness or tingling   ____________________________________________   PHYSICAL EXAM:  VITAL SIGNS: ED Triage Vitals [04/22/18 1959]  Enc Vitals Group     BP 138/90  Pulse Rate (!) 110     Resp 18     Temp 100 F (37.8 C)     Temp Source Oral     SpO2 97 %     Weight 220 lb (99.8 kg)     Height 5\' 3"  (1.6 m)     Head Circumference      Peak Flow      Pain Score 8     Pain Loc      Pain Edu?      Excl. in GC?      Constitutional: Alert and oriented. Well appearing and in no acute distress. Eyes: Conjunctivae are normal. PERRL. EOMI. Head:  Atraumatic. ENT:      Ears:      Nose: No congestion/rhinnorhea.      Mouth/Throat: Mucous membranes are moist.  Poor dentition.  Left lateral incisors are fractured.  Surrounding tenderness to palpation.  No visible swelling.  No difficulty opening or closing mouth. Neck: No stridor.   Cardiovascular: Normal rate, regular rhythm.  Good peripheral circulation. Respiratory: Normal respiratory effort without tachypnea or retractions. Lungs CTAB. Good air entry to the bases with no decreased or absent breath sounds. Gastrointestinal: Bowel sounds 4 quadrants. Soft with very mild epigastric tenderness. No guarding or rigidity. No palpable masses. No distention. No  Musculoskeletal: Full range of motion to all extremities. No gross deformities appreciated. Neurologic:  Normal speech and language. No gross focal neurologic deficits are appreciated.  Skin:  Skin is warm, dry and intact. No rash noted. Psychiatric: Mood and affect are normal. Speech and behavior are normal. Patient exhibits appropriate insight and judgement.   ____________________________________________   LABS (all labs ordered are listed, but only abnormal results are displayed)  Labs Reviewed  COMPREHENSIVE METABOLIC PANEL - Abnormal; Notable for the following components:      Result Value   Glucose, Bld 114 (*)    All other components within normal limits  CBC - Abnormal; Notable for the following components:   MCV 77.4 (*)    RDW 15.1 (*)    All other components within normal limits  URINALYSIS, COMPLETE (UACMP) WITH MICROSCOPIC - Abnormal; Notable for the following components:   Color, Urine YELLOW (*)    APPearance HAZY (*)    Ketones, ur 5 (*)    Leukocytes, UA SMALL (*)    All other components within normal limits  LIPASE, BLOOD  POC URINE PREG, ED  POCT PREGNANCY, URINE   ____________________________________________  EKG   ____________________________________________  RADIOLOGY   No results  found.  ____________________________________________    PROCEDURES  Procedure(s) performed:    Procedures    Medications  ondansetron (ZOFRAN-ODT) disintegrating tablet 4 mg (4 mg Oral Given 04/22/18 2121)  lidocaine (XYLOCAINE) 2 % viscous mouth solution 15 mL (15 mLs Mouth/Throat Given 04/22/18 2121)     ____________________________________________   INITIAL IMPRESSION / ASSESSMENT AND PLAN / ED COURSE  Pertinent labs & imaging results that were available during my care of the patient were reviewed by me and considered in my medical decision making (see chart for details).  Review of the [place] CSRS was performed in accordance of the [facility] prior to dispensing any controlled drugs.     Patient presented to the emergency department for evaluation of dental pain and epigastric pain and diarrhea on and off today.  Vital signs and exam are reassuring.  Symptoms are consistent with dental infection, cystitis, IBS. Lab work is largely unremarkable.  Urinalysis shows leukocytes.  She will be treated for urinary tract infection.  Patient has some very mild epigastric tenderness.  She appears well.  She has not had any diarrhea in the emergency department.  She felt better after viscous lidocaine.  She is drinking.  She has a history of GERD and may have some IBS.  Patient will be discharged home with prescriptions for Bactrim, amoxicillin, viscous lidocaine, Reglan. Patient is to follow up with primary care and dentist as directed. Patient is given ED precautions to return to the ED for any worsening or new symptoms.     ____________________________________________  FINAL CLINICAL IMPRESSION(S) / ED DIAGNOSES  Final diagnoses:  Dental abscess  Cystitis  Diarrhea, unspecified type      NEW MEDICATIONS STARTED DURING THIS VISIT:  ED Discharge Orders         Ordered    sulfamethoxazole-trimethoprim (BACTRIM DS,SEPTRA DS) 800-160 MG tablet  2 times daily     04/22/18 2202     amoxicillin (AMOXIL) 500 MG capsule  3 times daily     04/22/18 2202    lidocaine (XYLOCAINE) 2 % solution  As needed     04/22/18 2204    metoCLOPramide (REGLAN) 10 MG tablet  3 times daily with meals     04/22/18 2204              This chart was dictated using voice recognition software/Dragon. Despite best efforts to proofread, errors can occur which can change the meaning. Any change was purely unintentional.    Enid Derry, PA-C 04/23/18 0023    Rockne Menghini, MD 04/27/18 [id number]

## 2018-04-22 NOTE — ED Triage Notes (Signed)
Patient c/o X 2 weeks. Patient c/o epigastric pain beginning today. Patient c/o fatigue, nausea, and diarrhea. Patient denies emesis. Broken upper, central incisor noted on exam.

## 2018-04-22 NOTE — Discharge Instructions (Signed)
Please return to the emergency department for fever, chills, increasing abdominal pain or diarrhea, if you are unable to eat or drink anything, or any other symptoms concerning to you.

## 2018-07-03 ENCOUNTER — Emergency Department: Payer: Medicaid Other

## 2018-07-03 ENCOUNTER — Other Ambulatory Visit: Payer: Self-pay

## 2018-07-03 DIAGNOSIS — R079 Chest pain, unspecified: Secondary | ICD-10-CM | POA: Insufficient documentation

## 2018-07-03 DIAGNOSIS — Z5321 Procedure and treatment not carried out due to patient leaving prior to being seen by health care provider: Secondary | ICD-10-CM | POA: Insufficient documentation

## 2018-07-03 LAB — TROPONIN I: Troponin I: <0.03 ng/mL (ref <0.03)

## 2018-07-03 LAB — CBC
HEMATOCRIT: 40.5 % (ref 36.0–46.0)
Hemoglobin: 13.5 g/dL (ref 12.0–15.0)
MCH: 26.6 pg (ref 26.0–34.0)
MCHC: 33.3 g/dL (ref 30.0–36.0)
MCV: 79.9 fL — AB (ref 80.0–100.0)
PLATELETS: 221 10*3/uL (ref 150–400)
RBC: 5.07 MIL/uL (ref 3.87–5.11)
RDW: 12.8 % (ref 11.5–15.5)
WBC: 11.3 10*3/uL — ABNORMAL HIGH (ref 4.0–10.5)
nRBC: 0 % (ref 0.0–0.2)

## 2018-07-03 LAB — BASIC METABOLIC PANEL
Anion gap: 7 (ref 5–15)
BUN: 9 mg/dL (ref 6–20)
CALCIUM: 9.2 mg/dL (ref 8.9–10.3)
CHLORIDE: 106 mmol/L (ref 98–111)
CO2: 26 mmol/L (ref 22–32)
CREATININE: 0.6 mg/dL (ref 0.44–1.00)
GFR calc Af Amer: >60 mL/min (ref >60)
GFR calc non Af Amer: >60 mL/min (ref >60)
GLUCOSE: 100 mg/dL — AB (ref 70–99)
Potassium: 3.9 mmol/L (ref 3.5–5.1)
Sodium: 139 mmol/L (ref 135–145)

## 2018-07-03 NOTE — ED Triage Notes (Signed)
Pt states CP x 30 minutes. States was coughing when pain began. States central CP. Crying in triage. States "I couldn't take the pain for very long."   A&O, ambulatory. Speaking in clear, complete sentences.

## 2018-07-04 ENCOUNTER — Telehealth: Payer: Self-pay | Admitting: Emergency Medicine

## 2018-07-04 ENCOUNTER — Emergency Department
Admission: EM | Admit: 2018-07-04 | Discharge: 2018-07-04 | Disposition: A | Discharge status: Home / Self Care | Payer: Medicaid Other | Attending: Emergency Medicine | Admitting: Emergency Medicine

## 2018-07-04 NOTE — ED Notes (Signed)
No answer when called for xray

## 2018-07-04 NOTE — Telephone Encounter (Signed)
Called patient due to lwot to inquire about condition and follow up plans. Person who answers says Natalea is not there.  I told her that I was just calling to check on her.

## 2018-07-18 ENCOUNTER — Emergency Department: Payer: Medicaid Other

## 2018-07-18 ENCOUNTER — Encounter: Payer: Self-pay | Admitting: Emergency Medicine

## 2018-07-18 ENCOUNTER — Emergency Department
Admission: EM | Admit: 2018-07-18 | Discharge: 2018-07-18 | Disposition: A | Discharge status: Home / Self Care | Payer: Medicaid Other | Attending: Emergency Medicine | Admitting: Emergency Medicine

## 2018-07-18 DIAGNOSIS — J40 Bronchitis, not specified as acute or chronic: Secondary | ICD-10-CM | POA: Insufficient documentation

## 2018-07-18 DIAGNOSIS — F17210 Nicotine dependence, cigarettes, uncomplicated: Secondary | ICD-10-CM | POA: Insufficient documentation

## 2018-07-18 DIAGNOSIS — Z79899 Other long term (current) drug therapy: Secondary | ICD-10-CM | POA: Insufficient documentation

## 2018-07-18 MED ORDER — ALBUTEROL SULFATE HFA 108 (90 BASE) MCG/ACT IN AERS: 2.0000 | INHALATION_SPRAY | RESPIRATORY_TRACT | 0 refills | Status: DC | PRN

## 2018-07-18 MED ORDER — PREDNISONE 20 MG PO TABS: 40.0000 mg | ORAL_TABLET | Freq: Every day | ORAL | 0 refills | Status: DC

## 2018-07-18 MED ORDER — NAPROXEN 500 MG PO TABS: 500.0000 mg | ORAL_TABLET | Freq: Two times a day (BID) | ORAL | 0 refills | Status: DC

## 2018-07-18 NOTE — ED Triage Notes (Signed)
Patient presents to the ED with a cough x 1 month.  Patient reports chest pain occasionally with cough.  Patient states cough is worse when she lies down at night.  Patient reports taking tylenol cold and flu and mucinex with no relief.

## 2018-07-18 NOTE — ED Provider Notes (Signed)
[facility] Emergency Department Provider Note  ____________________________________________  Time seen: Approximately 8:56 PM  I have reviewed the triage vital signs and the nursing notes.   HISTORY  Chief Complaint Cough    HPI Terri [name] is a 22 y.o. female with no significant past medical history who complains of nonproductive cough for the past month.  Also has some chest discomfort with coughing but otherwise no chest pain.  No shortness of breath or pain with deep breathing.  Symptoms are improved by taking decongestants but have been persistent.  No recent travel trauma hospitalization or surgery.   No history of DVT or PE.  Symptoms are intermittent, worse lying down flat, no alleviating factors.  Nonradiating.    History reviewed. No pertinent past medical history.   There are no active problems to display for this patient.    Past Surgical History:  Procedure Laterality Date  . DILATION AND EVACUATION N/A 07/13/2017   Procedure: DILATATION AND EVACUATION;  Surgeon: Ward, Elenora Fenderhelsea C, MD;  Location: [facility] ORS;  Service: Gynecology;  Laterality: N/A;     Prior to Admission medications   Medication Sig Start Date End Date Taking? Authorizing Provider  albuterol (PROVENTIL HFA) 108 (90 Base) MCG/ACT inhaler Inhale 2 puffs into the lungs every 4 (four) hours as needed for wheezing or shortness of breath. 07/18/18   Sharman CheekStafford, Laykin Rainone, MD  amoxicillin (AMOXIL) 500 MG capsule Take 1 capsule (500 mg total) by mouth 3 (three) times daily. 04/22/18   Enid DerryWagner, Ashley, PA-C  cyclobenzaprine (FLEXERIL) 5 MG tablet Take 1-2 tablets (5-10 mg total) by mouth 3 (three) times daily as needed for muscle spasms. Patient not taking: Reported on 07/04/2017 07/07/16   Evon SlackGaines, Thomas C, PA-C  doxycycline (ADOXA) 100 MG tablet Take 1 tablet (100 mg total) 2 (two) times daily by mouth. 07/13/17   Ward, Elenora Fenderhelsea C, MD  ibuprofen (ADVIL,MOTRIN) 800 MG tablet Take 1  tablet (800 mg total) every 8 (eight) hours as needed by mouth. 07/13/17   Ward, Elenora Fenderhelsea C, MD  lidocaine (XYLOCAINE) 2 % solution Use as directed 10 mLs in the mouth or throat as needed for mouth pain. 04/22/18   Enid DerryWagner, Ashley, PA-C  metoCLOPramide (REGLAN) 10 MG tablet Take 1 tablet (10 mg total) by mouth 3 (three) times daily with meals. 04/22/18 04/22/19  Enid DerryWagner, Ashley, PA-C  naproxen (NAPROSYN) 500 MG tablet Take 1 tablet (500 mg total) by mouth 2 (two) times daily with a meal. 07/18/18   Sharman CheekStafford, Dalanie Kisner, MD  predniSONE (DELTASONE) 20 MG tablet Take 2 tablets (40 mg total) by mouth daily. 07/18/18   Sharman CheekStafford, Devion Chriscoe, MD  traMADol (ULTRAM) 50 MG tablet Take 1 tablet (50 mg total) by mouth every 6 (six) hours as needed. Patient not taking: Reported on 07/04/2017 07/07/16   Evon SlackGaines, Thomas C, PA-C     Allergies Zithromax [azithromycin]   No family history on file.  Social History Social History   Tobacco Use  . Smoking status: Current Every Day Smoker    Packs/day: 0.50    Types: Cigarettes  . Smokeless tobacco: Never Used  Substance Use Topics  . Alcohol use: No  . Drug use: No    Review of Systems  Constitutional:   No fever or chills.  ENT:   No sore throat. No rhinorrhea. Cardiovascular:   No chest pain or syncope. Respiratory:   No dyspnea positive nonproductive cough. Gastrointestinal:   Negative for abdominal pain, vomiting and diarrhea.  Musculoskeletal:   Negative  for focal pain or swelling All other systems reviewed and are negative except as documented above in ROS and HPI.  ____________________________________________   PHYSICAL EXAM:  VITAL SIGNS: ED Triage Vitals  Enc Vitals Group     BP 07/18/18 1858 140/90     Pulse Rate 07/18/18 1858 93     Resp 07/18/18 1858 20     Temp 07/18/18 1858 98.1 F (36.7 C)     Temp Source 07/18/18 1858 Oral     SpO2 07/18/18 1858 97 %     Weight 07/18/18 1859 225 lb (102.1 kg)     Height 07/18/18 1859 5\' 3"  (1.6 m)      Head Circumference --      Peak Flow --      Pain Score 07/18/18 1858 5     Pain Loc --      Pain Edu? --      Excl. in GC? --     Vital signs reviewed, nursing assessments reviewed.   Constitutional:   Alert and oriented. Non-toxic appearance. Eyes:   Conjunctivae are normal. EOMI. PERRL. ENT      Head:   Normocephalic and atraumatic.      Nose:   No congestion/rhinnorhea.       Mouth/Throat:   MMM, mild pharyngeal erythema. No peritonsillar mass.       Neck:   No meningismus. Full ROM. Hematological/Lymphatic/Immunilogical:   No cervical lymphadenopathy. Cardiovascular:   RRR. Symmetric bilateral radial and DP pulses.  No murmurs. Cap refill less than 2 seconds. Respiratory:   Normal respiratory effort without tachypnea/retractions. Breath sounds are clear and equal bilaterally.  Inducible wheezing with FEV1 maneuver and coughing  gastrointestinal:   Soft and nontender. Non distended. There is no CVA tenderness.  No rebound, rigidity, or guarding.  Musculoskeletal:   Normal range of motion in all extremities. No joint effusions.  No lower extremity tenderness.  No edema. Neurologic:   Normal speech and language.  Motor grossly intact. No acute focal neurologic deficits are appreciated.  Skin:    Skin is warm, dry and intact. No rash noted.  No petechiae, purpura, or bullae.  ____________________________________________    LABS (pertinent positives/negatives) (all labs ordered are listed, but only abnormal results are displayed) Labs Reviewed - No data to display ____________________________________________   EKG  Interpreted by me Normal sinus rhythm rate of 97, right axis, normal intervals QRS ST segments and T waves.  Unchanged from previous  ____________________________________________    RADIOLOGY  Dg Chest 2 View  Result Date: 07/18/2018 CLINICAL DATA:  Cough for 1 month.  Shortness of breath. EXAM: CHEST - 2 VIEW COMPARISON:  07/04/2017 FINDINGS: The  heart size and mediastinal contours are within normal limits. Both lungs are clear. The visualized skeletal structures are unremarkable. IMPRESSION: No active cardiopulmonary disease. Electronically Signed   By: Signa Kell M.D.   On: 07/18/2018 19:49    ____________________________________________   PROCEDURES Procedures  ____________________________________________    CLINICAL IMPRESSION / ASSESSMENT AND PLAN / ED COURSE  Pertinent labs & imaging results that were available during my care of the patient were reviewed by me and considered in my medical decision making (see chart for details).    Patient presents with cough and wheezing, consistent with bronchitis.  She is a smoker.  I doubt pneumonia pneumothorax pulmonary embolism pericarditis, especially with normal vital signs.  Will treat with prednisone anti-inflammatories and albuterol.  Follow-up with primary care.      ____________________________________________  FINAL CLINICAL IMPRESSION(S) / ED DIAGNOSES    Final diagnoses:  Bronchitis     ED Discharge Orders         Ordered    predniSONE (DELTASONE) 20 MG tablet  Daily     07/18/18 2055    albuterol (PROVENTIL HFA) 108 (90 Base) MCG/ACT inhaler  Every 4 hours PRN     07/18/18 2055    naproxen (NAPROSYN) 500 MG tablet  2 times daily with meals     07/18/18 2055          Portions of this note were generated with dragon dictation software. Dictation errors may occur despite best attempts at proofreading.    Sharman Cheek, MD 07/18/18 2100

## 2018-07-18 NOTE — ED Notes (Signed)
Pt went to X-Ray.  

## 2018-09-29 DIAGNOSIS — R7989 Other specified abnormal findings of blood chemistry: Secondary | ICD-10-CM | POA: Diagnosis not present

## 2018-09-29 DIAGNOSIS — R945 Abnormal results of liver function studies: Secondary | ICD-10-CM | POA: Diagnosis not present

## 2018-09-29 DIAGNOSIS — R1110 Vomiting, unspecified: Secondary | ICD-10-CM | POA: Diagnosis not present

## 2018-09-29 DIAGNOSIS — R740 Nonspecific elevation of levels of transaminase and lactic acid dehydrogenase [LDH]: Secondary | ICD-10-CM | POA: Diagnosis not present

## 2018-09-29 DIAGNOSIS — R1010 Upper abdominal pain, unspecified: Secondary | ICD-10-CM | POA: Diagnosis not present

## 2018-09-29 DIAGNOSIS — I499 Cardiac arrhythmia, unspecified: Secondary | ICD-10-CM | POA: Diagnosis not present

## 2018-09-29 DIAGNOSIS — R1013 Epigastric pain: Secondary | ICD-10-CM | POA: Diagnosis not present

## 2018-09-29 DIAGNOSIS — R112 Nausea with vomiting, unspecified: Secondary | ICD-10-CM | POA: Diagnosis not present

## 2018-09-29 DIAGNOSIS — R1011 Right upper quadrant pain: Secondary | ICD-10-CM | POA: Diagnosis not present

## 2018-09-29 DIAGNOSIS — R079 Chest pain, unspecified: Secondary | ICD-10-CM | POA: Diagnosis not present

## 2018-09-30 DIAGNOSIS — R51 Headache: Secondary | ICD-10-CM | POA: Diagnosis not present

## 2018-09-30 DIAGNOSIS — K7200 Acute and subacute hepatic failure without coma: Secondary | ICD-10-CM | POA: Diagnosis not present

## 2018-10-01 DIAGNOSIS — R51 Headache: Secondary | ICD-10-CM | POA: Diagnosis not present

## 2018-10-01 DIAGNOSIS — K7200 Acute and subacute hepatic failure without coma: Secondary | ICD-10-CM | POA: Diagnosis not present

## 2018-10-28 ENCOUNTER — Emergency Department: Payer: Medicaid Other

## 2018-10-28 ENCOUNTER — Emergency Department
Admission: EM | Admit: 2018-10-28 | Discharge: 2018-10-28 | Disposition: A | Discharge status: Home / Self Care | Payer: Medicaid Other | Attending: Student in an Organized Health Care Education/Training Program | Admitting: Student in an Organized Health Care Education/Training Program

## 2018-10-28 ENCOUNTER — Other Ambulatory Visit: Payer: Self-pay

## 2018-10-28 ENCOUNTER — Encounter: Payer: Self-pay | Admitting: Emergency Medicine

## 2018-10-28 DIAGNOSIS — K8020 Calculus of gallbladder without cholecystitis without obstruction: Secondary | ICD-10-CM | POA: Diagnosis not present

## 2018-10-28 DIAGNOSIS — F17210 Nicotine dependence, cigarettes, uncomplicated: Secondary | ICD-10-CM | POA: Diagnosis not present

## 2018-10-28 DIAGNOSIS — Z79899 Other long term (current) drug therapy: Secondary | ICD-10-CM | POA: Insufficient documentation

## 2018-10-28 DIAGNOSIS — R1011 Right upper quadrant pain: Secondary | ICD-10-CM | POA: Diagnosis present

## 2018-10-28 LAB — COMPREHENSIVE METABOLIC PANEL
ALT: 19 U/L (ref 0–44)
AST: 19 U/L (ref 15–41)
Albumin: 4.3 g/dL (ref 3.5–5.0)
Alkaline Phosphatase: 77 U/L (ref 38–126)
Anion gap: 7 (ref 5–15)
BUN: 7 mg/dL (ref 6–20)
CHLORIDE: 107 mmol/L (ref 98–111)
CO2: 24 mmol/L (ref 22–32)
Calcium: 8.9 mg/dL (ref 8.9–10.3)
Creatinine, Ser: 0.54 mg/dL (ref 0.44–1.00)
Glucose, Bld: 97 mg/dL (ref 70–99)
POTASSIUM: 4 mmol/L (ref 3.5–5.1)
Sodium: 138 mmol/L (ref 135–145)
Total Bilirubin: 0.6 mg/dL (ref 0.3–1.2)
Total Protein: 7.6 g/dL (ref 6.5–8.1)

## 2018-10-28 LAB — URINALYSIS, COMPLETE (UACMP) WITH MICROSCOPIC
BILIRUBIN URINE: NEGATIVE
Bacteria, UA: NONE SEEN
Glucose, UA: NEGATIVE mg/dL
HGB URINE DIPSTICK: NEGATIVE
Ketones, ur: NEGATIVE mg/dL
LEUKOCYTE UA: NEGATIVE
NITRITE: NEGATIVE
Protein, ur: NEGATIVE mg/dL
SPECIFIC GRAVITY, URINE: 1.021 (ref 1.005–1.030)
pH: 5 (ref 5.0–8.0)

## 2018-10-28 LAB — CBC
HEMATOCRIT: 40.2 % (ref 36.0–46.0)
HEMOGLOBIN: 13.8 g/dL (ref 12.0–15.0)
MCH: 27.6 pg (ref 26.0–34.0)
MCHC: 34.3 g/dL (ref 30.0–36.0)
MCV: 80.4 fL (ref 80.0–100.0)
NRBC: 0 % (ref 0.0–0.2)
Platelets: 182 10*3/uL (ref 150–400)
RBC: 5 MIL/uL (ref 3.87–5.11)
RDW: 14 % (ref 11.5–15.5)
WBC: 8.8 10*3/uL (ref 4.0–10.5)

## 2018-10-28 LAB — LIPASE, BLOOD: LIPASE: 22 U/L (ref 11–51)

## 2018-10-28 LAB — POCT PREGNANCY, URINE: PREG TEST UR: NEGATIVE

## 2018-10-28 MED ORDER — FENTANYL CITRATE (PF) 100 MCG/2ML IJ SOLN
50.0000 ug | INTRAMUSCULAR | Status: DC | PRN
  Administered 2018-10-28: 50 ug via NASAL

## 2018-10-28 MED ORDER — PROMETHAZINE HCL 25 MG/ML IJ SOLN: 12.5000 mg | Freq: Four times a day (QID) | INTRAMUSCULAR | Status: DC | PRN

## 2018-10-28 MED ORDER — PROCHLORPERAZINE MALEATE 10 MG PO TABS: 10.0000 mg | ORAL_TABLET | Freq: Four times a day (QID) | ORAL | 0 refills | Status: DC | PRN

## 2018-10-28 MED ORDER — MORPHINE SULFATE (PF) 4 MG/ML IV SOLN: 4.0000 mg | INTRAVENOUS | Status: DC | PRN

## 2018-10-28 MED ORDER — SODIUM CHLORIDE 0.9 % IV BOLUS
1000.0000 mL | Freq: Once | INTRAVENOUS | Status: AC
  Administered 2018-10-28: 1000 mL via INTRAVENOUS

## 2018-10-28 MED ORDER — SODIUM CHLORIDE 0.9% FLUSH
3.0000 mL | Freq: Once | INTRAVENOUS | Status: AC
  Administered 2018-10-28: 3 mL via INTRAVENOUS

## 2018-10-28 MED ORDER — KETOROLAC TROMETHAMINE 30 MG/ML IJ SOLN
15.0000 mg | Freq: Once | INTRAMUSCULAR | Status: AC
  Administered 2018-10-28: 15 mg via INTRAVENOUS
  Filled 2018-10-28: qty 1

## 2018-10-28 MED ORDER — NAPROXEN 500 MG PO TABS: 500.0000 mg | ORAL_TABLET | Freq: Two times a day (BID) | ORAL | 0 refills | Status: DC

## 2018-10-28 MED ORDER — FENTANYL CITRATE (PF) 100 MCG/2ML IJ SOLN
INTRAMUSCULAR | Status: AC
  Filled 2018-10-28: qty 2

## 2018-10-28 MED ORDER — HYDROCODONE-ACETAMINOPHEN 5-325 MG PO TABS: 1.0000 | ORAL_TABLET | ORAL | 0 refills | Status: DC | PRN

## 2018-10-28 NOTE — ED Notes (Signed)
reports abd pain radiating into bilateral back. Patients states admitted [facility] days for same c/o. Abnormal liver enzyne levels then. Same pain today. Awaiting md eval  And plan of care.

## 2018-10-28 NOTE — ED Notes (Signed)
Returned from [facility] meds given

## 2018-10-28 NOTE — Discharge Instructions (Addendum)

## 2018-10-28 NOTE — ED Provider Notes (Signed)
[facility] Emergency Department Provider Note    First MD Initiated Contact with Patient 10/28/18 [phone]     (approximate)  I have reviewed the triage vital signs and the nursing notes.   HISTORY  Chief Complaint Abdominal Pain    HPI Terri [name] is a 23 y.o. female close past medical history with recent admission at [facility] for evaluation of right upper quadrant abdominal pain and concern for cholecystitis but discharged as her work-up was reassuring presents several weeks later with recurrent right upper quadrant pain radiating through to her back.  States is worse than previous.  Is felt like she needed to vomit but has not actually vomited.  Does still feel nauseated.  No measured fevers.    History reviewed. No pertinent past medical history. No family history on file. Past Surgical History:  Procedure Laterality Date  . DILATION AND EVACUATION N/A 07/13/2017   Procedure: DILATATION AND EVACUATION;  Surgeon: Ward, Elenora Fender, MD;  Location: [facility] ORS;  Service: Gynecology;  Laterality: N/A;   There are no active problems to display for this patient.     Prior to Admission medications   Medication Sig Start Date End Date Taking? Authorizing Provider  albuterol (PROVENTIL HFA) 108 (90 Base) MCG/ACT inhaler Inhale 2 puffs into the lungs every 4 (four) hours as needed for wheezing or shortness of breath. 07/18/18   Sharman Cheek, MD  amoxicillin (AMOXIL) 500 MG capsule Take 1 capsule (500 mg total) by mouth 3 (three) times daily. 04/22/18   Enid Derry, PA-C  cyclobenzaprine (FLEXERIL) 5 MG tablet Take 1-2 tablets (5-10 mg total) by mouth 3 (three) times daily as needed for muscle spasms. Patient not taking: Reported on 07/04/2017 07/07/16   Evon Slack, PA-C  doxycycline (ADOXA) 100 MG tablet Take 1 tablet (100 mg total) 2 (two) times daily by mouth. 07/13/17   Ward, Elenora Fender, MD  ibuprofen (ADVIL,MOTRIN) 800 MG tablet Take 1  tablet (800 mg total) every 8 (eight) hours as needed by mouth. 07/13/17   Ward, Elenora Fender, MD  lidocaine (XYLOCAINE) 2 % solution Use as directed 10 mLs in the mouth or throat as needed for mouth pain. 04/22/18   Enid Derry, PA-C  metoCLOPramide (REGLAN) 10 MG tablet Take 1 tablet (10 mg total) by mouth 3 (three) times daily with meals. 04/22/18 04/22/19  Enid Derry, PA-C  naproxen (NAPROSYN) 500 MG tablet Take 1 tablet (500 mg total) by mouth 2 (two) times daily with a meal. 07/18/18   Sharman Cheek, MD  predniSONE (DELTASONE) 20 MG tablet Take 2 tablets (40 mg total) by mouth daily. 07/18/18   Sharman Cheek, MD  traMADol (ULTRAM) 50 MG tablet Take 1 tablet (50 mg total) by mouth every 6 (six) hours as needed. Patient not taking: Reported on 07/04/2017 07/07/16   Evon Slack, PA-C    Allergies Zithromax [azithromycin]    Social History Social History   Tobacco Use  . Smoking status: Current Every Day Smoker    Packs/day: 0.50    Types: Cigarettes  . Smokeless tobacco: Never Used  Substance Use Topics  . Alcohol use: No  . Drug use: No    Review of Systems Patient denies headaches, rhinorrhea, blurry vision, numbness, shortness of breath, chest pain, edema, cough, abdominal pain, nausea, vomiting, diarrhea, dysuria, fevers, rashes or hallucinations unless otherwise stated above in HPI. ____________________________________________   PHYSICAL EXAM:  VITAL SIGNS: Vitals:   10/28/18 1439 10/28/18 1611  BP: Marland Kitchen)  159/110 (!) 148/87  Pulse: 94 86  Resp: 16 18  Temp: 98 F (36.7 C) 97.8 F (36.6 C)  SpO2: 97% 98%    Constitutional: Alert and oriented.  Uncomfortable appearing Eyes: Conjunctivae are normal.  Head: Atraumatic. Nose: No congestion/rhinnorhea. Mouth/Throat: Mucous membranes are moist.   Neck: No stridor. Painless ROM.  Cardiovascular: Normal rate, regular rhythm. Grossly normal heart sounds.  Good peripheral circulation. Respiratory: Normal  respiratory effort.  No retractions. Lungs CTAB. Gastrointestinal: Soft but with tenderness to palpation the right upper quadrant.  No focal peritonitis or rebound tenderness.  No distention. No abdominal bruits. No CVA tenderness. Genitourinary: deferred Musculoskeletal: No lower extremity tenderness nor edema.  No joint effusions. Neurologic:  Normal speech and language. No gross focal neurologic deficits are appreciated. No facial droop Skin:  Skin is warm, dry and intact. No rash noted. Psychiatric: Mood and affect are normal. Speech and behavior are normal.  ____________________________________________   LABS (all labs ordered are listed, but only abnormal results are displayed)  Results for orders placed or performed during the hospital encounter of 10/28/18 (from the past 24 hour(s))  Lipase, blood     Status: None   Collection Time: 10/28/18  2:45 PM  Result Value Ref Range   Lipase 22 11 - 51 U/L  Comprehensive metabolic panel     Status: None   Collection Time: 10/28/18  2:45 PM  Result Value Ref Range   Sodium 138 135 - 145 mmol/L   Potassium 4.0 3.5 - 5.1 mmol/L   Chloride 107 98 - 111 mmol/L   CO2 24 22 - 32 mmol/L   Glucose, Bld 97 70 - 99 mg/dL   BUN 7 6 - 20 mg/dL   Creatinine, Ser 6.96 0.44 - 1.00 mg/dL   Calcium 8.9 8.9 - 29.5 mg/dL   Total Protein 7.6 6.5 - 8.1 g/dL   Albumin 4.3 3.5 - 5.0 g/dL   AST 19 15 - 41 U/L   ALT 19 0 - 44 U/L   Alkaline Phosphatase 77 38 - 126 U/L   Total Bilirubin 0.6 0.3 - 1.2 mg/dL   GFR calc non Af Amer >60 >60 mL/min   GFR calc Af Amer >60 >60 mL/min   Anion gap 7 5 - 15  CBC     Status: None   Collection Time: 10/28/18  2:45 PM  Result Value Ref Range   WBC 8.8 4.0 - 10.5 K/uL   RBC 5.00 3.87 - 5.11 MIL/uL   Hemoglobin 13.8 12.0 - 15.0 g/dL   HCT 28.4 13.2 - 44.0 %   MCV 80.4 80.0 - 100.0 fL   MCH 27.6 26.0 - 34.0 pg   MCHC 34.3 30.0 - 36.0 g/dL   RDW 10.2 72.5 - 36.6 %   Platelets 182 150 - 400 K/uL   nRBC 0.0 0.0 -  0.2 %   ____________________________________________ ____________________________________________  RADIOLOGY  I personally reviewed all radiographic images ordered to evaluate for the above acute complaints and reviewed radiology reports and findings.  These findings were personally discussed with the patient.  Please see medical record for radiology report.  ____________________________________________   PROCEDURES  Procedure(s) performed:  Procedures    Critical Care performed: no ____________________________________________   INITIAL IMPRESSION / ASSESSMENT AND PLAN / ED COURSE  Pertinent labs & imaging results that were available during my care of the patient were reviewed by me and considered in my medical decision making (see chart for details).   DDX: Cholelithiasis, cholecystitis,  appendicitis, enteritis, gastritis, pneumonia  Terri [name] is a 23 y.o. who presents to the ED with symptoms as described above.  Seems be primarily having right upper quadrant abdominal pain.  Blood work is reassuring but based on her discomfort will order ultrasound to evaluate cholelithiasis.  Does not seem clinically consistent with acute appendicitis.  She denies any vaginal discharge.  No pelvic pain.  No flank pain.  Clinical Course as of Oct 29 1947  Fri Oct 28, 2018  1807 Patient reassessed.  Repeat abdominal exam is soft and benign.  I do suspect biliary colic and given findings of cholelithiasis will give referral to general surgery.  Patient otherwise stable discussed signs and symptoms for which she should return to the ER.   [PR]    Clinical Course User Index [PR] Willy Eddy, MD     As part of my medical decision making, I reviewed the following data within the electronic MEDICAL RECORD NUMBER Nursing notes reviewed and incorporated, Labs reviewed, notes from prior ED visits and [place] Controlled Substance  Database   ____________________________________________   FINAL CLINICAL IMPRESSION(S) / ED DIAGNOSES  Final diagnoses:  RUQ pain  Calculus of gallbladder without cholecystitis without obstruction      NEW MEDICATIONS STARTED DURING THIS VISIT:  New Prescriptions   No medications on file     Note:  This document was prepared using Dragon voice recognition software and may include unintentional dictation errors.    Willy Eddy, MD 10/28/18 1949

## 2018-10-28 NOTE — ED Triage Notes (Signed)
C/O RUQ pain.  Recently hosptialized for similar symptoms and LFT's elevated.  Pain returned this am.  Has had one episode of emesis.

## 2018-10-31 DIAGNOSIS — K828 Other specified diseases of gallbladder: Secondary | ICD-10-CM | POA: Diagnosis not present

## 2018-10-31 DIAGNOSIS — R110 Nausea: Secondary | ICD-10-CM | POA: Diagnosis not present

## 2018-10-31 DIAGNOSIS — R1011 Right upper quadrant pain: Secondary | ICD-10-CM | POA: Diagnosis not present

## 2018-10-31 DIAGNOSIS — R1013 Epigastric pain: Secondary | ICD-10-CM | POA: Diagnosis not present

## 2018-10-31 DIAGNOSIS — R161 Splenomegaly, not elsewhere classified: Secondary | ICD-10-CM | POA: Diagnosis not present

## 2018-10-31 DIAGNOSIS — R7989 Other specified abnormal findings of blood chemistry: Secondary | ICD-10-CM | POA: Diagnosis not present

## 2018-11-03 ENCOUNTER — Encounter: Payer: Self-pay | Admitting: Emergency Medicine

## 2018-11-03 ENCOUNTER — Other Ambulatory Visit: Payer: Self-pay

## 2018-11-03 ENCOUNTER — Emergency Department: Payer: Medicaid Other

## 2018-11-03 DIAGNOSIS — Z881 Allergy status to other antibiotic agents status: Secondary | ICD-10-CM | POA: Diagnosis not present

## 2018-11-03 DIAGNOSIS — K8070 Calculus of gallbladder and bile duct without cholecystitis without obstruction: Secondary | ICD-10-CM | POA: Diagnosis present

## 2018-11-03 DIAGNOSIS — Z791 Long term (current) use of non-steroidal anti-inflammatories (NSAID): Secondary | ICD-10-CM | POA: Insufficient documentation

## 2018-11-03 DIAGNOSIS — Z79899 Other long term (current) drug therapy: Secondary | ICD-10-CM | POA: Diagnosis not present

## 2018-11-03 DIAGNOSIS — F17210 Nicotine dependence, cigarettes, uncomplicated: Secondary | ICD-10-CM | POA: Diagnosis not present

## 2018-11-03 DIAGNOSIS — E669 Obesity, unspecified: Secondary | ICD-10-CM | POA: Insufficient documentation

## 2018-11-03 DIAGNOSIS — Z882 Allergy status to sulfonamides status: Secondary | ICD-10-CM | POA: Diagnosis not present

## 2018-11-03 DIAGNOSIS — Z6837 Body mass index (BMI) 37.0-37.9, adult: Secondary | ICD-10-CM | POA: Diagnosis not present

## 2018-11-03 DIAGNOSIS — K8020 Calculus of gallbladder without cholecystitis without obstruction: Secondary | ICD-10-CM | POA: Diagnosis not present

## 2018-11-03 DIAGNOSIS — K810 Acute cholecystitis: Principal | ICD-10-CM | POA: Insufficient documentation

## 2018-11-03 DIAGNOSIS — R935 Abnormal findings on diagnostic imaging of other abdominal regions, including retroperitoneum: Secondary | ICD-10-CM | POA: Diagnosis not present

## 2018-11-03 LAB — CBC WITH DIFFERENTIAL/PLATELET
Abs Immature Granulocytes: 0.02 10*3/uL (ref 0.00–0.07)
Basophils Absolute: 0 10*3/uL (ref 0.0–0.1)
Basophils Relative: 1 %
Eosinophils Absolute: 0.2 10*3/uL (ref 0.0–0.5)
Eosinophils Relative: 4 %
HCT: 42.5 % (ref 36.0–46.0)
Hemoglobin: 14.2 g/dL (ref 12.0–15.0)
Immature Granulocytes: 0 %
LYMPHS ABS: 1.8 10*3/uL (ref 0.7–4.0)
LYMPHS PCT: 28 %
MCH: 27.2 pg (ref 26.0–34.0)
MCHC: 33.4 g/dL (ref 30.0–36.0)
MCV: 81.4 fL (ref 80.0–100.0)
MONOS PCT: 9 %
Monocytes Absolute: 0.6 10*3/uL (ref 0.1–1.0)
Neutro Abs: 3.6 10*3/uL (ref 1.7–7.7)
Neutrophils Relative %: 58 %
Platelets: 201 10*3/uL (ref 150–400)
RBC: 5.22 MIL/uL — ABNORMAL HIGH (ref 3.87–5.11)
RDW: 14.4 % (ref 11.5–15.5)
WBC: 6.3 10*3/uL (ref 4.0–10.5)
nRBC: 0 % (ref 0.0–0.2)

## 2018-11-03 LAB — COMPREHENSIVE METABOLIC PANEL
ALT: 259 U/L — AB (ref 0–44)
AST: 163 U/L — ABNORMAL HIGH (ref 15–41)
Albumin: 4.3 g/dL (ref 3.5–5.0)
Alkaline Phosphatase: 223 U/L — ABNORMAL HIGH (ref 38–126)
Anion gap: 10 (ref 5–15)
BUN: 11 mg/dL (ref 6–20)
CO2: 23 mmol/L (ref 22–32)
Calcium: 9.6 mg/dL (ref 8.9–10.3)
Chloride: 106 mmol/L (ref 98–111)
Creatinine, Ser: 0.58 mg/dL (ref 0.44–1.00)
GFR calc Af Amer: >60 mL/min (ref >60)
GFR calc non Af Amer: >60 mL/min (ref >60)
Glucose, Bld: 117 mg/dL — ABNORMAL HIGH (ref 70–99)
Potassium: 3.7 mmol/L (ref 3.5–5.1)
Sodium: 139 mmol/L (ref 135–145)
TOTAL PROTEIN: 8.4 g/dL — AB (ref 6.5–8.1)
Total Bilirubin: 0.8 mg/dL (ref 0.3–1.2)

## 2018-11-03 LAB — URINALYSIS, COMPLETE (UACMP) WITH MICROSCOPIC
Bacteria, UA: NONE SEEN
Glucose, UA: NEGATIVE mg/dL
Hgb urine dipstick: NEGATIVE
Ketones, ur: NEGATIVE mg/dL
Leukocytes,Ua: NEGATIVE
Nitrite: NEGATIVE
Protein, ur: NEGATIVE mg/dL
Specific Gravity, Urine: 1.029 (ref 1.005–1.030)
pH: 5 (ref 5.0–8.0)

## 2018-11-03 LAB — LIPASE, BLOOD: Lipase: 25 U/L (ref 11–51)

## 2018-11-03 LAB — TROPONIN I: Troponin I: <0.03 ng/mL (ref <0.03)

## 2018-11-03 LAB — POCT PREGNANCY, URINE: PREG TEST UR: NEGATIVE

## 2018-11-03 MED ORDER — ONDANSETRON HCL 4 MG/2ML IJ SOLN
INTRAMUSCULAR | Status: AC
  Filled 2018-11-03: qty 2

## 2018-11-03 NOTE — ED Triage Notes (Addendum)
Patient ambulatory to triage with steady gait, without difficulty, pt tearful; pt reports right upper abd pain radiating around into back x month, worse tonight accomp by N/V; st hx of same and told coming from her gallbladder and liver and needs an ERCP

## 2018-11-04 ENCOUNTER — Emergency Department: Payer: Medicaid Other

## 2018-11-04 ENCOUNTER — Observation Stay: Payer: Medicaid Other | Admitting: Anesthesiology

## 2018-11-04 ENCOUNTER — Encounter: Payer: Self-pay | Admitting: *Deleted

## 2018-11-04 ENCOUNTER — Encounter
Admission: EM | Disposition: A | Discharge status: Home / Self Care | Payer: Self-pay | Source: Home / Self Care | Attending: Emergency Medicine

## 2018-11-04 ENCOUNTER — Observation Stay
Admission: EM | Admit: 2018-11-04 | Discharge: 2018-11-05 | Disposition: A | Discharge status: Home / Self Care | Payer: Medicaid Other | Attending: Surgery | Admitting: Surgery

## 2018-11-04 ENCOUNTER — Observation Stay: Payer: Medicaid Other

## 2018-11-04 ENCOUNTER — Other Ambulatory Visit: Payer: Self-pay

## 2018-11-04 DIAGNOSIS — K8020 Calculus of gallbladder without cholecystitis without obstruction: Secondary | ICD-10-CM | POA: Diagnosis present

## 2018-11-04 DIAGNOSIS — K819 Cholecystitis, unspecified: Secondary | ICD-10-CM | POA: Diagnosis not present

## 2018-11-04 DIAGNOSIS — K8000 Calculus of gallbladder with acute cholecystitis without obstruction: Secondary | ICD-10-CM

## 2018-11-04 DIAGNOSIS — R945 Abnormal results of liver function studies: Secondary | ICD-10-CM

## 2018-11-04 DIAGNOSIS — R935 Abnormal findings on diagnostic imaging of other abdominal regions, including retroperitoneum: Secondary | ICD-10-CM | POA: Diagnosis not present

## 2018-11-04 DIAGNOSIS — R7989 Other specified abnormal findings of blood chemistry: Secondary | ICD-10-CM

## 2018-11-04 DIAGNOSIS — R52 Pain, unspecified: Secondary | ICD-10-CM

## 2018-11-04 DIAGNOSIS — R109 Unspecified abdominal pain: Secondary | ICD-10-CM

## 2018-11-04 DIAGNOSIS — K8050 Calculus of bile duct without cholangitis or cholecystitis without obstruction: Secondary | ICD-10-CM

## 2018-11-04 HISTORY — PX: CHOLECYSTECTOMY: SHX55

## 2018-11-04 LAB — CBC
HCT: 37.2 % (ref 36.0–46.0)
Hemoglobin: 12.4 g/dL (ref 12.0–15.0)
MCH: 27.4 pg (ref 26.0–34.0)
MCHC: 33.3 g/dL (ref 30.0–36.0)
MCV: 82.3 fL (ref 80.0–100.0)
PLATELETS: 150 10*3/uL (ref 150–400)
RBC: 4.52 MIL/uL (ref 3.87–5.11)
RDW: 14.5 % (ref 11.5–15.5)
WBC: 6 10*3/uL (ref 4.0–10.5)
nRBC: 0 % (ref 0.0–0.2)

## 2018-11-04 LAB — COMPREHENSIVE METABOLIC PANEL
ALT: 218 U/L — ABNORMAL HIGH (ref 0–44)
AST: 142 U/L — ABNORMAL HIGH (ref 15–41)
Albumin: 3.8 g/dL (ref 3.5–5.0)
Alkaline Phosphatase: 200 U/L — ABNORMAL HIGH (ref 38–126)
Anion gap: 8 (ref 5–15)
BUN: 12 mg/dL (ref 6–20)
CO2: 24 mmol/L (ref 22–32)
Calcium: 8.9 mg/dL (ref 8.9–10.3)
Chloride: 107 mmol/L (ref 98–111)
Creatinine, Ser: 0.45 mg/dL (ref 0.44–1.00)
GFR calc Af Amer: >60 mL/min (ref >60)
GFR calc non Af Amer: >60 mL/min (ref >60)
Glucose, Bld: 86 mg/dL (ref 70–99)
Potassium: 3.7 mmol/L (ref 3.5–5.1)
SODIUM: 139 mmol/L (ref 135–145)
Total Bilirubin: 0.7 mg/dL (ref 0.3–1.2)
Total Protein: 7.3 g/dL (ref 6.5–8.1)

## 2018-11-04 LAB — URINE DRUG SCREEN, QUALITATIVE (ARMC ONLY)
Amphetamines, Ur Screen: NOT DETECTED
BARBITURATES, UR SCREEN: NOT DETECTED
Benzodiazepine, Ur Scrn: NOT DETECTED
Cannabinoid 50 Ng, Ur ~~LOC~~: POSITIVE — AB
Cocaine Metabolite,Ur ~~LOC~~: NOT DETECTED
MDMA (Ecstasy)Ur Screen: NOT DETECTED
Methadone Scn, Ur: NOT DETECTED
Opiate, Ur Screen: NOT DETECTED
Phencyclidine (PCP) Ur S: NOT DETECTED
Tricyclic, Ur Screen: NOT DETECTED

## 2018-11-04 LAB — MAGNESIUM: MAGNESIUM: 1.8 mg/dL (ref 1.7–2.4)

## 2018-11-04 SURGERY — LAPAROSCOPIC CHOLECYSTECTOMY
Anesthesia: General

## 2018-11-04 MED ORDER — HYDROMORPHONE HCL 1 MG/ML IJ SOLN
0.5000 mg | INTRAMUSCULAR | Status: DC | PRN
  Administered 2018-11-04 – 2018-11-05 (×4): 0.5 mg via INTRAVENOUS
  Filled 2018-11-04 (×4): qty 0.5

## 2018-11-04 MED ORDER — MIDAZOLAM HCL 2 MG/2ML IJ SOLN
INTRAMUSCULAR | Status: DC | PRN
  Administered 2018-11-04: 2 mg via INTRAVENOUS

## 2018-11-04 MED ORDER — FENTANYL CITRATE (PF) 100 MCG/2ML IJ SOLN
INTRAMUSCULAR | Status: AC
  Filled 2018-11-04: qty 2

## 2018-11-04 MED ORDER — SUGAMMADEX SODIUM 200 MG/2ML IV SOLN
INTRAVENOUS | Status: AC
  Filled 2018-11-04: qty 2

## 2018-11-04 MED ORDER — SUGAMMADEX SODIUM 200 MG/2ML IV SOLN
INTRAVENOUS | Status: DC | PRN
  Administered 2018-11-04: 200 mg via INTRAVENOUS

## 2018-11-04 MED ORDER — LACTATED RINGERS IV SOLN
125.0000 mL/h | INTRAVENOUS | Status: DC
  Administered 2018-11-04 – 2018-11-05 (×3): 125 mL/h via INTRAVENOUS

## 2018-11-04 MED ORDER — ONDANSETRON HCL 4 MG/2ML IJ SOLN
INTRAMUSCULAR | Status: DC | PRN
  Administered 2018-11-04: 4 mg via INTRAVENOUS

## 2018-11-04 MED ORDER — MEPERIDINE HCL 50 MG/ML IJ SOLN: 12.5000 mg | INTRAMUSCULAR | Status: DC | PRN

## 2018-11-04 MED ORDER — DEXAMETHASONE SODIUM PHOSPHATE 10 MG/ML IJ SOLN
INTRAMUSCULAR | Status: DC | PRN
  Administered 2018-11-04: 10 mg via INTRAVENOUS

## 2018-11-04 MED ORDER — HYDROMORPHONE HCL 1 MG/ML IJ SOLN
1.0000 mg | INTRAMUSCULAR | Status: AC
  Administered 2018-11-04: 1 mg via INTRAVENOUS
  Filled 2018-11-04: qty 1

## 2018-11-04 MED ORDER — KETOROLAC TROMETHAMINE 30 MG/ML IJ SOLN
30.0000 mg | Freq: Four times a day (QID) | INTRAMUSCULAR | Status: DC
  Administered 2018-11-04 – 2018-11-05 (×4): 30 mg via INTRAVENOUS
  Filled 2018-11-04 (×4): qty 1

## 2018-11-04 MED ORDER — FENTANYL CITRATE (PF) 100 MCG/2ML IJ SOLN
INTRAMUSCULAR | Status: AC
  Administered 2018-11-04: 25 ug via INTRAVENOUS
  Filled 2018-11-04: qty 2

## 2018-11-04 MED ORDER — FENTANYL CITRATE (PF) 100 MCG/2ML IJ SOLN
25.0000 ug | INTRAMUSCULAR | Status: DC | PRN
  Administered 2018-11-04 (×4): 25 ug via INTRAVENOUS

## 2018-11-04 MED ORDER — PANTOPRAZOLE SODIUM 40 MG IV SOLR
40.0000 mg | Freq: Every day | INTRAVENOUS | Status: DC
  Administered 2018-11-04: 40 mg via INTRAVENOUS
  Filled 2018-11-04: qty 40

## 2018-11-04 MED ORDER — DEXAMETHASONE SODIUM PHOSPHATE 10 MG/ML IJ SOLN
INTRAMUSCULAR | Status: AC
  Filled 2018-11-04: qty 1

## 2018-11-04 MED ORDER — ONDANSETRON 4 MG PO TBDP: 4.0000 mg | ORAL_TABLET | Freq: Four times a day (QID) | ORAL | Status: DC | PRN

## 2018-11-04 MED ORDER — KETOROLAC TROMETHAMINE 30 MG/ML IJ SOLN
15.0000 mg | Freq: Once | INTRAMUSCULAR | Status: AC
  Administered 2018-11-04: 15 mg via INTRAVENOUS

## 2018-11-04 MED ORDER — LIDOCAINE HCL (CARDIAC) PF 100 MG/5ML IV SOSY
PREFILLED_SYRINGE | INTRAVENOUS | Status: DC | PRN
  Administered 2018-11-04: 60 mg via INTRAVENOUS
  Administered 2018-11-04: 40 mg via INTRAVENOUS

## 2018-11-04 MED ORDER — AMOXICILLIN-POT CLAVULANATE 875-125 MG PO TABS: 1.0000 | ORAL_TABLET | Freq: Two times a day (BID) | ORAL | 0 refills | Status: DC

## 2018-11-04 MED ORDER — PROPOFOL 10 MG/ML IV BOLUS
INTRAVENOUS | Status: AC
  Filled 2018-11-04: qty 20

## 2018-11-04 MED ORDER — ONDANSETRON HCL 4 MG/2ML IJ SOLN
INTRAMUSCULAR | Status: AC
  Filled 2018-11-04: qty 2

## 2018-11-04 MED ORDER — ACETAMINOPHEN 10 MG/ML IV SOLN
INTRAVENOUS | Status: DC | PRN
  Administered 2018-11-04: 1000 mg via INTRAVENOUS

## 2018-11-04 MED ORDER — BUPIVACAINE-EPINEPHRINE (PF) 0.25% -1:200000 IJ SOLN
INTRAMUSCULAR | Status: DC | PRN
  Administered 2018-11-04: 30 mL via PERINEURAL

## 2018-11-04 MED ORDER — OXYCODONE HCL 5 MG PO TABS: 5.0000 mg | ORAL_TABLET | Freq: Once | ORAL | Status: DC | PRN

## 2018-11-04 MED ORDER — CEFAZOLIN SODIUM-DEXTROSE 2-3 GM-%(50ML) IV SOLR
INTRAVENOUS | Status: DC | PRN
  Administered 2018-11-04: 2 g via INTRAVENOUS

## 2018-11-04 MED ORDER — POLYETHYLENE GLYCOL 3350 17 G PO PACK: 17.0000 g | PACK | Freq: Every day | ORAL | Status: DC | PRN

## 2018-11-04 MED ORDER — CEFAZOLIN SODIUM 1 G IJ SOLR
INTRAMUSCULAR | Status: AC
  Filled 2018-11-04: qty 20

## 2018-11-04 MED ORDER — ACETAMINOPHEN 500 MG PO TABS
1000.0000 mg | ORAL_TABLET | Freq: Four times a day (QID) | ORAL | Status: DC
  Administered 2018-11-04 – 2018-11-05 (×2): 1000 mg via ORAL
  Filled 2018-11-04 (×3): qty 2

## 2018-11-04 MED ORDER — MIDAZOLAM HCL 2 MG/2ML IJ SOLN
INTRAMUSCULAR | Status: AC
  Filled 2018-11-04: qty 2

## 2018-11-04 MED ORDER — MORPHINE SULFATE (PF) 4 MG/ML IV SOLN
INTRAVENOUS | Status: AC
  Filled 2018-11-04: qty 1

## 2018-11-04 MED ORDER — OXYCODONE HCL 5 MG PO TABS
5.0000 mg | ORAL_TABLET | ORAL | Status: DC | PRN
  Administered 2018-11-04 – 2018-11-05 (×3): 10 mg via ORAL
  Filled 2018-11-04 (×3): qty 2

## 2018-11-04 MED ORDER — FENTANYL CITRATE (PF) 100 MCG/2ML IJ SOLN
INTRAMUSCULAR | Status: DC | PRN
  Administered 2018-11-04: 100 ug via INTRAVENOUS

## 2018-11-04 MED ORDER — MORPHINE SULFATE (PF) 4 MG/ML IV SOLN
4.0000 mg | Freq: Once | INTRAVENOUS | Status: AC
  Administered 2018-11-04: 4 mg via INTRAVENOUS

## 2018-11-04 MED ORDER — SODIUM CHLORIDE (PF) 0.9 % IJ SOLN
INTRAMUSCULAR | Status: AC
  Filled 2018-11-04: qty 20

## 2018-11-04 MED ORDER — KETOROLAC TROMETHAMINE 30 MG/ML IJ SOLN
INTRAMUSCULAR | Status: AC
  Filled 2018-11-04: qty 1

## 2018-11-04 MED ORDER — SODIUM CHLORIDE (PF) 0.9 % IJ SOLN
INTRAMUSCULAR | Status: AC
  Filled 2018-11-04: qty 10

## 2018-11-04 MED ORDER — PROMETHAZINE HCL 25 MG/ML IJ SOLN
INTRAMUSCULAR | Status: AC
  Administered 2018-11-04: 12.5 mg via INTRAVENOUS
  Filled 2018-11-04: qty 1

## 2018-11-04 MED ORDER — ONDANSETRON HCL 4 MG/2ML IJ SOLN
4.0000 mg | Freq: Once | INTRAMUSCULAR | Status: AC
  Administered 2018-11-04: 4 mg via INTRAVENOUS

## 2018-11-04 MED ORDER — PROMETHAZINE HCL 25 MG/ML IJ SOLN
12.5000 mg | INTRAMUSCULAR | Status: DC | PRN
  Administered 2018-11-04: 12.5 mg via INTRAVENOUS

## 2018-11-04 MED ORDER — PIPERACILLIN-TAZOBACTAM 3.375 G IVPB
3.3750 g | Freq: Three times a day (TID) | INTRAVENOUS | Status: DC
  Administered 2018-11-04 – 2018-11-05 (×2): 3.375 g via INTRAVENOUS
  Filled 2018-11-04 (×2): qty 50

## 2018-11-04 MED ORDER — DEXMEDETOMIDINE HCL 200 MCG/2ML IV SOLN
INTRAVENOUS | Status: DC | PRN
  Administered 2018-11-04 (×2): 20 ug via INTRAVENOUS

## 2018-11-04 MED ORDER — GADOBUTROL 1 MMOL/ML IV SOLN
9.0000 mL | Freq: Once | INTRAVENOUS | Status: AC | PRN
  Administered 2018-11-04: 9 mL via INTRAVENOUS

## 2018-11-04 MED ORDER — ENOXAPARIN SODIUM 40 MG/0.4ML ~~LOC~~ SOLN: 40.0000 mg | SUBCUTANEOUS | Status: DC

## 2018-11-04 MED ORDER — PROPOFOL 10 MG/ML IV BOLUS
INTRAVENOUS | Status: DC | PRN
  Administered 2018-11-04: 200 mg via INTRAVENOUS

## 2018-11-04 MED ORDER — OXYCODONE HCL 5 MG PO TABS: 5.0000 mg | ORAL_TABLET | ORAL | 0 refills | Status: DC | PRN

## 2018-11-04 MED ORDER — IBUPROFEN 600 MG PO TABS: 600.0000 mg | ORAL_TABLET | Freq: Three times a day (TID) | ORAL | 0 refills | Status: DC | PRN

## 2018-11-04 MED ORDER — ROCURONIUM BROMIDE 100 MG/10ML IV SOLN
INTRAVENOUS | Status: DC | PRN
  Administered 2018-11-04: 30 mg via INTRAVENOUS
  Administered 2018-11-04: 20 mg via INTRAVENOUS

## 2018-11-04 MED ORDER — OXYCODONE HCL 5 MG/5ML PO SOLN: 5.0000 mg | Freq: Once | ORAL | Status: DC | PRN

## 2018-11-04 MED ORDER — BUPIVACAINE-EPINEPHRINE (PF) 0.25% -1:200000 IJ SOLN
INTRAMUSCULAR | Status: AC
  Filled 2018-11-04: qty 30

## 2018-11-04 MED ORDER — ONDANSETRON HCL 4 MG/2ML IJ SOLN: 4.0000 mg | Freq: Four times a day (QID) | INTRAMUSCULAR | Status: DC | PRN

## 2018-11-04 SURGICAL SUPPLY — 45 items
APPLIER CLIP 5 13 M/L LIGAMAX5 (MISCELLANEOUS) ×2
BLADE SURG 15 STRL LF DISP TIS (BLADE) ×1 IMPLANT
BLADE SURG 15 STRL SS (BLADE) ×1
CANISTER SUCT 1200ML W/VALVE (MISCELLANEOUS) ×2 IMPLANT
CATH CHOLANGI 4FR 420404F (CATHETERS) IMPLANT
CHLORAPREP W/TINT 26ML (MISCELLANEOUS) ×2 IMPLANT
CLIP APPLIE 5 13 M/L LIGAMAX5 (MISCELLANEOUS) ×1 IMPLANT
CONRAY 60ML FOR OR (MISCELLANEOUS) IMPLANT
COVER WAND RF STERILE (DRAPES) IMPLANT
DERMABOND ADVANCED (GAUZE/BANDAGES/DRESSINGS) ×1
DERMABOND ADVANCED .7 DNX12 (GAUZE/BANDAGES/DRESSINGS) ×1 IMPLANT
DRAPE C-ARM XRAY 36X54 (DRAPES) IMPLANT
ELECT REM PT RETURN 9FT ADLT (ELECTROSURGICAL) ×2
ELECTRODE REM PT RTRN 9FT ADLT (ELECTROSURGICAL) ×1 IMPLANT
GLOVE BIO SURGEON STRL SZ7 (GLOVE) ×2 IMPLANT
GLOVE BIOGEL PI IND STRL 7.5 (GLOVE) ×1 IMPLANT
GLOVE BIOGEL PI INDICATOR 7.5 (GLOVE) ×1
GLOVE SURG SYN 7.0 (GLOVE) ×2 IMPLANT
GLOVE SURG SYN 7.5  E (GLOVE) ×1
GLOVE SURG SYN 7.5 E (GLOVE) ×1 IMPLANT
GOWN STRL REUS W/ TWL LRG LVL3 (GOWN DISPOSABLE) ×2 IMPLANT
GOWN STRL REUS W/TWL LRG LVL3 (GOWN DISPOSABLE) ×2
IRRIGATION STRYKERFLOW (MISCELLANEOUS) IMPLANT
IRRIGATOR STRYKERFLOW (MISCELLANEOUS)
IV CATH ANGIO 12GX3 LT BLUE (NEEDLE) IMPLANT
IV NS 1000ML (IV SOLUTION)
IV NS 1000ML BAXH (IV SOLUTION) IMPLANT
JACKSON PRATT 10 (INSTRUMENTS) IMPLANT
L-HOOK LAP DISP 36CM (ELECTROSURGICAL) ×2
LABEL OR SOLS (LABEL) ×2 IMPLANT
LHOOK LAP DISP 36CM (ELECTROSURGICAL) ×1 IMPLANT
NEEDLE HYPO 22GX1.5 SAFETY (NEEDLE) ×2 IMPLANT
PACK LAP CHOLECYSTECTOMY (MISCELLANEOUS) ×2 IMPLANT
PENCIL ELECTRO HAND CTR (MISCELLANEOUS) ×2 IMPLANT
POUCH SPECIMEN RETRIEVAL 10MM (ENDOMECHANICALS) ×2 IMPLANT
SCISSORS METZENBAUM CVD 33 (INSTRUMENTS) ×2 IMPLANT
SET TUBE SMOKE EVAC HIGH FLOW (TUBING) ×2 IMPLANT
SLEEVE ADV FIXATION 5X100MM (TROCAR) ×6 IMPLANT
SPONGE VERSALON 4X4 4PLY (MISCELLANEOUS) IMPLANT
SUT MNCRL 4-0 (SUTURE) ×1
SUT MNCRL 4-0 27XMFL (SUTURE) ×1
SUT VICRYL 0 AB UR-6 (SUTURE) ×2 IMPLANT
SUTURE MNCRL 4-0 27XMF (SUTURE) ×1 IMPLANT
TROCAR BALLN GELPORT 12X130M (ENDOMECHANICALS) ×2 IMPLANT
TROCAR Z-THREAD OPTICAL 5X100M (TROCAR) ×2 IMPLANT

## 2018-11-04 NOTE — ED Provider Notes (Signed)
[facility] Emergency Department Provider Note  ____________________________________________   First MD Initiated Contact with Patient 11/04/18 0031     (approximate)  I have reviewed the triage vital signs and the nursing notes.   HISTORY  Chief Complaint Abdominal Pain    HPI Terri [name] is a 23 y.o. female with no contributory past medical history who presents for evaluation of recurrent and severe sharp and aching upper abdominal pain that radiates into the right upper quadrant associated with nausea and vomiting.  She says that nothing particular makes it better and it gets worse after she eats.  This is been going on for more than a month.  She has had 2 visits to [facility] including one admission elevated liver enzymes.  She is not clear why they did not do surgery or what was going on to cause her issues.  In between the 2 visits to [facility] (the second of which was a visit only to the emergency department), she came to [facility] and was discharged for surgery follow-up but she has not had the opportunity to see a surgeon yet.  She is tearful and in severe pain upon arrival.  She denies fever/chills, chest pain, shortness of breath.  She does not drink alcohol or use illegal drugs.  She is extremely frustrated and in pain and needs to know what is going on because the pain is so bad that she has trouble taking care of her 2 kids.         History reviewed. No pertinent past medical history.  Patient Active Problem List   Diagnosis Date Noted  . Symptomatic cholelithiasis 11/04/2018    Past Surgical History:  Procedure Laterality Date  . DILATION AND EVACUATION N/A 07/13/2017   Procedure: DILATATION AND EVACUATION;  Surgeon: Ward, Elenora Fender, MD;  Location: [facility] ORS;  Service: Gynecology;  Laterality: N/A;    Prior to Admission medications   Medication Sig Start Date End Date Taking? Authorizing Provider  diphenhydrAMINE (BENADRYL) 50 MG capsule Take 50  mg by mouth at bedtime as needed for sleep.   Yes [provider]  ibuprofen (ADVIL,MOTRIN) 200 MG tablet Take 800 mg by mouth every 6 (six) hours as needed for mild pain.   Yes [provider]  prochlorperazine (COMPAZINE) 10 MG tablet Take 1 tablet (10 mg total) by mouth every 6 (six) hours as needed for nausea or vomiting. 10/28/18  Yes Willy Eddy, MD  traMADol (ULTRAM) 50 MG tablet Take 50 mg by mouth every 6 (six) hours as needed for moderate pain.   Yes [provider]  albuterol (PROVENTIL HFA) 108 (90 Base) MCG/ACT inhaler Inhale 2 puffs into the lungs every 4 (four) hours as needed for wheezing or shortness of breath. Patient not taking: Reported on 11/04/2018 07/18/18   Sharman Cheek, MD  cyclobenzaprine (FLEXERIL) 5 MG tablet Take 1-2 tablets (5-10 mg total) by mouth 3 (three) times daily as needed for muscle spasms. Patient not taking: Reported on 07/04/2017 07/07/16   Evon Slack, PA-C  HYDROcodone-acetaminophen [facility]) 5-325 MG tablet Take 1 tablet by mouth every 4 (four) hours as needed for moderate pain. Patient not taking: Reported on 11/04/2018 10/28/18   Willy Eddy, MD  naproxen (NAPROSYN) 500 MG tablet Take 1 tablet (500 mg total) by mouth 2 (two) times daily with a meal. Patient not taking: Reported on 11/04/2018 07/18/18   Sharman Cheek, MD  naproxen (NAPROSYN) 500 MG tablet Take 1 tablet (500 mg total) by  mouth 2 (two) times daily with a meal. Patient not taking: Reported on 11/04/2018 10/28/18 10/28/19  Willy Eddy, MD    Allergies Sulfa antibiotics and Zithromax [azithromycin]  No family history on file.  Social History Social History   Tobacco Use  . Smoking status: Current Every Day Smoker    Packs/day: 0.50    Types: Cigarettes  . Smokeless tobacco: Never Used  Substance Use Topics  . Alcohol use: No  . Drug use: No    Review of Systems Constitutional: No fever/chills Eyes: No visual changes. ENT: No sore  throat. Cardiovascular: Denies chest pain. Respiratory: Denies shortness of breath. Gastrointestinal: Severe upper and right upper quadrant abdominal pain associated with nausea and vomiting as described above.   Genitourinary: Negative for dysuria. Musculoskeletal: Negative for neck pain.  Negative for back pain. Integumentary: Negative for rash. Neurological: Negative for headaches, focal weakness or numbness.   ____________________________________________   PHYSICAL EXAM:  VITAL SIGNS: ED Triage Vitals [11/03/18 2315]  Enc Vitals Group     BP (!) 133/97     Pulse Rate 99     Resp 18     Temp 98.1 F (36.7 C)     Temp Source Oral     SpO2 97 %     Weight      Height      Head Circumference      Peak Flow      Pain Score 9     Pain Loc      Pain Edu?      Excl. in GC?     Constitutional: Alert and oriented.  Appears to be in severe pain and is tearful. Eyes: Conjunctivae are normal.  Head: Atraumatic. Nose: No congestion/rhinnorhea. Mouth/Throat: Mucous membranes are moist. Neck: No stridor.  No meningeal signs.   Cardiovascular: Normal rate, regular rhythm. Good peripheral circulation. Grossly normal heart sounds. Respiratory: Normal respiratory effort.  No retractions. Lungs CTAB. Gastrointestinal: Soft and nondistended.  Severe tenderness to palpation of the epigastrium and right upper quadrant with positive Murphy's sign and guarding. Musculoskeletal: No lower extremity tenderness nor edema. No gross deformities of extremities. Neurologic:  Normal speech and language. No gross focal neurologic deficits are appreciated.  Skin:  Skin is warm, dry and intact. No rash noted. Psychiatric: Mood and affect are tearful and upset.  Otherwise normal.  ____________________________________________   LABS (all labs ordered are listed, but only abnormal results are displayed)  Labs Reviewed  CBC WITH DIFFERENTIAL/PLATELET - Abnormal; Notable for the following components:       Result Value   RBC 5.22 (*)    All other components within normal limits  COMPREHENSIVE METABOLIC PANEL - Abnormal; Notable for the following components:   Glucose, Bld 117 (*)    Total Protein 8.4 (*)    AST 163 (*)    ALT 259 (*)    Alkaline Phosphatase 223 (*)    All other components within normal limits  URINALYSIS, COMPLETE (UACMP) WITH MICROSCOPIC - Abnormal; Notable for the following components:   Color, Urine AMBER (*)    APPearance CLEAR (*)    Bilirubin Urine SMALL (*)    All other components within normal limits  COMPREHENSIVE METABOLIC PANEL - Abnormal; Notable for the following components:   AST 142 (*)    ALT 218 (*)    Alkaline Phosphatase 200 (*)    All other components within normal limits  TROPONIN I  LIPASE, BLOOD  MAGNESIUM  CBC  HEPATITIS PANEL, ACUTE  HIV ANTIBODY (ROUTINE TESTING W REFLEX)  URINE DRUG SCREEN, QUALITATIVE ([facility] ONLY)  POCT PREGNANCY, URINE   ____________________________________________  EKG  None - EKG not ordered by ED physician ____________________________________________  RADIOLOGY   ED MD interpretation: Radiologist reports gallstones with no gallbladder wall thickening or pericholecystic fluid.  Upon review of the images and discussion with the surgeon, this is likely gallbladder polyps rather than stones.  Official radiology report(s): US Abdomen Limited Ruq  Result Date: 11/04/2018 CLINICAL DATA:  RIGHT upper quadrant pain radiating to back for months, worsening tonight. Nausea and vomiting. History of cholelithiasis. EXAM: ULTRASOUND ABDOMEN LIMITED RIGHT UPPER QUADRANT COMPARISON:  RIGHT upper quadrant ultrasound October 28, 2018 FINDINGS: Gallbladder: Multiple echogenic gallstones measuring to 5 mm with acoustic shadowing. No gallbladder wall thickening or pericholecystic fluid. No sonographic Murphy sign elicited though patient had received pain medication per technologist note. Common bile duct: Diameter: Three-view.  Liver: No focal lesion identified. Normal parenchymal echogenicity. Portal vein is patent on color Doppler imaging with normal direction of blood flow towards the liver. IMPRESSION: Cholelithiasis without sonographic findings of acute cholecystitis. Electronically Signed   By: Awilda Metro M.D.   On: 11/04/2018 02:29    ____________________________________________   PROCEDURES   Procedure(s) performed (including Critical Care):  Procedures   ____________________________________________   INITIAL IMPRESSION / MDM / ASSESSMENT AND PLAN / ED COURSE  As part of my medical decision making, I reviewed the following data within the electronic MEDICAL RECORD NUMBER Nursing notes reviewed and incorporated, Labs reviewed , Old chart reviewed, Discussed with admitting physician , A consult was requested and obtained from this/these consultant(s) Surgery and Notes from prior ED visits         Differential diagnosis includes, but is not limited to, biliary disease including cholecystitis or biliary colic, nonspecific hepatitis, pancreatitis, gastritis, acalculous cholecystitis, less likely neoplasm.  I reviewed the medical records from [facility] and from her last ED visit at [facility].  She had some temporary transaminitis while at [facility] at the end of January which was thought to be secondary to a viral hepatitis.  Initially she had an elevated bilirubin but that resolved along with the LFTs and she was discharged.  At [facility] her lab work was normal.  At the [facility] emergency department visit it appears her lab work was again normal but she had some gallbladder changes both on CT and ultrasound for which they recommended nonemergent outpatient MRCP  Today the patient's labs are notable for mild transaminitis with no elevation of total bilirubin.  She has a mild leukocytosis but no evidence of acute infection..  She is in severe pain denies and was given Dilaudid 1 mg IV, morphine 4 mg IV, and Toradol 15 mg IV along with  Zofran 4 mg IV.  This is able to alleviate the pain but not completely resolve it.  Her ultrasound revealed either gallstones or gallbladder polyps.  I discussed the case by phone with Dr. Aleen Campi with general surgery.  He offered to see the patient in close follow-up in clinic if she can tolerate being discharged, but if not, he will admit her.  I discussed with the patient and she is still in severe pain (which resulted in the last round of pain medicine as described above) and does not think she can tolerate anything to eat or drink.  I updated Dr. Aleen Campi and he will admit her for further evaluation.  I offered to obtain an MRCP but given that she has a normal total  bilirubin he suggested that we wait on the MRCP and he will order it if it is necessary.  I updated the patient and she agrees with the plan.     ____________________________________________  FINAL CLINICAL IMPRESSION(S) / ED DIAGNOSES  Final diagnoses:  Pain  Biliary colic  Elevated LFTs  Intractable pain     MEDICATIONS GIVEN DURING THIS VISIT:  Medications  lactated ringers infusion (125 mL/hr Intravenous Transfusing/Transfer 11/04/18 0350)  acetaminophen (TYLENOL) tablet 1,000 mg (has no administration in time range)  ketorolac (TORADOL) 30 MG/ML injection 30 mg (has no administration in time range)  oxyCODONE (Oxy IR/ROXICODONE) immediate release tablet 5-10 mg (has no administration in time range)  HYDROmorphone (DILAUDID) injection 0.5 mg (0.5 mg Intravenous Given 11/04/18 0551)  polyethylene glycol (MIRALAX / GLYCOLAX) packet 17 g (has no administration in time range)  ondansetron (ZOFRAN-ODT) disintegrating tablet 4 mg (has no administration in time range)    Or  ondansetron (ZOFRAN) injection 4 mg (has no administration in time range)  pantoprazole (PROTONIX) injection 40 mg (has no administration in time range)  enoxaparin (LOVENOX) injection 40 mg (has no administration in time range)  ondansetron (ZOFRAN)  injection 4 mg (4 mg Intravenous Given 11/04/18 0014)  HYDROmorphone (DILAUDID) injection 1 mg (1 mg Intravenous Given 11/04/18 0043)  morphine 4 MG/ML injection 4 mg (4 mg Intravenous Given 11/04/18 0325)  ketorolac (TORADOL) 30 MG/ML injection 15 mg (15 mg Intravenous Given 11/04/18 0325)     ED Discharge Orders    None       Note:  This document was prepared using Dragon voice recognition software and may include unintentional dictation errors.   Loleta Rose, MD 11/04/18 [phone]

## 2018-11-04 NOTE — ED Notes (Signed)
Patient transported to Ultrasound 

## 2018-11-04 NOTE — Anesthesia Preprocedure Evaluation (Signed)
Anesthesia Evaluation  Patient identified by MRN, date of birth, ID band Patient awake    Reviewed: Allergy & Precautions, NPO status , Patient's Chart, lab work & pertinent test results  History of Anesthesia Complications Negative for: history of anesthetic complications  Airway Mallampati: II  TM Distance: >3 FB Neck ROM: Full    Dental  (+) Poor Dentition,    Pulmonary neg sleep apnea, neg COPD, Current Smoker,    breath sounds clear to auscultation- rhonchi (-) wheezing      Cardiovascular Exercise Tolerance: Good (-) hypertension(-) CAD, (-) Past MI, (-) Cardiac Stents and (-) CABG  Rhythm:Regular Rate:Normal - Systolic murmurs and - Diastolic murmurs    Neuro/Psych neg Seizures negative neurological ROS  negative psych ROS   GI/Hepatic negative GI ROS, Neg liver ROS,   Endo/Other  negative endocrine ROSneg diabetes  Renal/GU negative Renal ROS     Musculoskeletal negative musculoskeletal ROS (+)   Abdominal (+) + obese,   Peds  Hematology negative hematology ROS (+)   Anesthesia Other Findings    Reproductive/Obstetrics                             Anesthesia Physical Anesthesia Plan  ASA: II  Anesthesia Plan: General   Post-op Pain Management:    Induction: Intravenous  PONV Risk Score and Plan: 1 and Ondansetron and Midazolam  Airway Management Planned: Oral ETT  Additional Equipment:   Intra-op Plan:   Post-operative Plan: Extubation in OR  Informed Consent: I have reviewed the patients History and Physical, chart, labs and discussed the procedure including the risks, benefits and alternatives for the proposed anesthesia with the patient or authorized representative who has indicated his/her understanding and acceptance.     Dental advisory given  Plan Discussed with: CRNA and Anesthesiologist  Anesthesia Plan Comments:         Anesthesia Quick  Evaluation

## 2018-11-04 NOTE — ED Notes (Signed)
Pharmacy tech at bedside 

## 2018-11-04 NOTE — H&P (Signed)
[facility] SURGICAL HISTORY & PHYSICAL (cpt [phone])  HISTORY OF PRESENT ILLNESS (HPI):  23 y.o. female presented to [facility] ED today for abdominal pain. Patient reports about a 1 month history of intermittent cramping dull abdominal pain located in her epigastric region that radiates to her RUQ and back. The pain is worsened with deep inspiration, laying flat, and she feels this recently became associate with eating. She was seen for similar pain at the onset and found to have elevated LFTs and was told "it was a viral infection" and admitted for 3 days. She presented again to the ED at [facility] on the 28th of February and found to have cholelithiasis and referred to general surgery. She presented again last night and found to have similar. She endorses associated nausea and emesis with the pain. Denied any fever, chills, CP, SOB, or bladder/bowel changes. Prior to a month ago, no history of similar pain. No previous abdominal surgeries.   General Surgery was consulted by emergency medicine physician Dr Loleta Rose, MD for evaluation and management of symptomatic cholelithiasis.     PAST MEDICAL HISTORY (PMH):  History reviewed. No pertinent past medical history.  Reviewed. Otherwise negative.   PAST SURGICAL HISTORY (PSH):  Past Surgical History:  Procedure Laterality Date  . DILATION AND EVACUATION N/A 07/13/2017   Procedure: DILATATION AND EVACUATION;  Surgeon: Ward, Elenora Fender, MD;  Location: [facility] ORS;  Service: Gynecology;  Laterality: N/A;    Reviewed. Otherwise negative.   MEDICATIONS:  Prior to Admission medications   Medication Sig Start Date End Date Taking? Authorizing Provider  diphenhydrAMINE (BENADRYL) 50 MG capsule Take 50 mg by mouth at bedtime as needed for sleep.   Yes [provider]  ibuprofen (ADVIL,MOTRIN) 200 MG tablet Take 800 mg by mouth every 6 (six) hours as needed for mild pain.   Yes [provider]  prochlorperazine (COMPAZINE) 10 MG  tablet Take 1 tablet (10 mg total) by mouth every 6 (six) hours as needed for nausea or vomiting. 10/28/18  Yes Willy Eddy, MD  traMADol (ULTRAM) 50 MG tablet Take 50 mg by mouth every 6 (six) hours as needed for moderate pain.   Yes [provider]  albuterol (PROVENTIL HFA) 108 (90 Base) MCG/ACT inhaler Inhale 2 puffs into the lungs every 4 (four) hours as needed for wheezing or shortness of breath. Patient not taking: Reported on 11/04/2018 07/18/18   Sharman Cheek, MD  cyclobenzaprine (FLEXERIL) 5 MG tablet Take 1-2 tablets (5-10 mg total) by mouth 3 (three) times daily as needed for muscle spasms. Patient not taking: Reported on 07/04/2017 07/07/16   Evon Slack, PA-C  HYDROcodone-acetaminophen [facility]) 5-325 MG tablet Take 1 tablet by mouth every 4 (four) hours as needed for moderate pain. Patient not taking: Reported on 11/04/2018 10/28/18   Willy Eddy, MD  naproxen (NAPROSYN) 500 MG tablet Take 1 tablet (500 mg total) by mouth 2 (two) times daily with a meal. Patient not taking: Reported on 11/04/2018 07/18/18   Sharman Cheek, MD  naproxen (NAPROSYN) 500 MG tablet Take 1 tablet (500 mg total) by mouth 2 (two) times daily with a meal. Patient not taking: Reported on 11/04/2018 10/28/18 10/28/19  Willy Eddy, MD     ALLERGIES:  Allergies  Allergen Reactions  . Sulfa Antibiotics Other (See Comments)    Reaction: unknown.  Told allergic as child but believes she's taken since.  Marland Kitchen Zithromax [Azithromycin] Other (See Comments)    Reaction: unknown Told reaction as a child  SOCIAL HISTORY:  Social History   Socioeconomic History  . Marital status: Single    Spouse name: Not on file  . Number of children: Not on file  . Years of education: Not on file  . Highest education level: Not on file  Occupational History  . Not on file  Social Needs  . Financial resource strain: Not on file  . Food insecurity:    Worry: Not on file    Inability: Not on file   . Transportation needs:    Medical: Not on file    Non-medical: Not on file  Tobacco Use  . Smoking status: Current Every Day Smoker    Packs/day: 0.50    Types: Cigarettes  . Smokeless tobacco: Never Used  Substance and Sexual Activity  . Alcohol use: No  . Drug use: No  . Sexual activity: Not on file  Lifestyle  . Physical activity:    Days per week: Not on file    Minutes per session: Not on file  . Stress: Not on file  Relationships  . Social connections:    Talks on phone: Not on file    Gets together: Not on file    Attends religious service: Not on file    Active member of club or organization: Not on file    Attends meetings of clubs or organizations: Not on file    Relationship status: Not on file  . Intimate partner violence:    Fear of current or ex partner: Not on file    Emotionally abused: Not on file    Physically abused: Not on file    Forced sexual activity: Not on file  Other Topics Concern  . Not on file  Social History Narrative  . Not on file     FAMILY HISTORY:  No family history on file.  Otherwise negative.   REVIEW OF SYSTEMS:  Review of Systems  Constitutional: Negative for chills and fever.  Respiratory: Negative for cough and sputum production.   Cardiovascular: Negative for chest pain and palpitations.  Gastrointestinal: Positive for abdominal pain, nausea and vomiting. Negative for constipation and diarrhea.  Genitourinary: Negative for dysuria and urgency.  Neurological: Negative for dizziness and headaches.  All other systems reviewed and are negative.   VITAL SIGNS:  Temp:  [98.1 F (36.7 C)-98.5 F (36.9 C)] 98.5 F (36.9 C) (03/06 0406) Pulse Rate:  [76-99] 76 (03/06 0406) Resp:  [18-19] 18 (03/06 0406) BP: (107-157)/(80-97) 107/80 (03/06 0406) SpO2:  [94 %-97 %] 94 % (03/06 0406) Weight:  [94.9 kg] 94.9 kg (03/06 0406)     Height: 5\' 3"  (160 cm) Weight: 94.9 kg BMI (Calculated): 37.09   PHYSICAL EXAM:  Physical  Exam Constitutional:      General: She is not in acute distress.    Appearance: She is obese. She is not ill-appearing.  HENT:     Head: Normocephalic and atraumatic.  Eyes:     General: No scleral icterus.    Extraocular Movements: Extraocular movements intact.  Cardiovascular:     Rate and Rhythm: Normal rate and regular rhythm.     Heart sounds: Normal heart sounds. No murmur. No friction rub. No gallop.   Pulmonary:     Effort: Pulmonary effort is normal. No respiratory distress.     Breath sounds: Normal breath sounds. No wheezing or rhonchi.  Abdominal:     General: Abdomen is protuberant. There is no distension.     Palpations: Abdomen is soft.  Tenderness: There is abdominal tenderness in the epigastric area. There is no guarding or rebound. Negative signs include Murphy's sign.  Genitourinary:    Comments: Deferred Skin:    General: Skin is warm and dry.     Coloration: Skin is not jaundiced or pale.  Neurological:     General: No focal deficit present.     Mental Status: She is alert and oriented to person, place, and time.  Psychiatric:        Mood and Affect: Mood normal.        Behavior: Behavior normal.     INTAKE/OUTPUT:  This shift: No intake/output data recorded.  Last 2 shifts: @IOLAST2SHIFTS @  Labs:  CBC Latest Ref Rng & Units 11/04/2018 11/03/2018 10/28/2018  WBC 4.0 - 10.5 K/uL 6.0 6.3 8.8  Hemoglobin 12.0 - 15.0 g/dL 09.6 28.3 66.2  Hematocrit 36.0 - 46.0 % 37.2 42.5 40.2  Platelets 150 - 400 K/uL 150 201 182   CMP Latest Ref Rng & Units 11/04/2018 11/03/2018 10/28/2018  Glucose 70 - 99 mg/dL 86 947(M) 97  BUN 6 - 20 mg/dL 12 11 7   Creatinine 0.44 - 1.00 mg/dL 5.46 5.03 5.46  Sodium 135 - 145 mmol/L 139 139 138  Potassium 3.5 - 5.1 mmol/L 3.7 3.7 4.0  Chloride 98 - 111 mmol/L 107 106 107  CO2 22 - 32 mmol/L 24 23 24   Calcium 8.9 - 10.3 mg/dL 8.9 9.6 8.9  Total Protein 6.5 - 8.1 g/dL 7.3 5.6(C) 7.6  Total Bilirubin 0.3 - 1.2 mg/dL 0.7 0.8 0.6   Alkaline Phos 38 - 126 U/L 200(H) 223(H) 77  AST 15 - 41 U/L 142(H) 163(H) 19  ALT 0 - 44 U/L 218(H) 259(H) 19     Imaging studies:   RUQ [place] (11/04/2018) personally reviewed and radiologist report reviewed:  IMPRESSION: Cholelithiasis without sonographic findings of acute cholecystitis.    Assessment/Plan: (ICD-10's: K72.20) 23 y.o. female with elevated LFT and abdominal pain attributable to multiple episodes of symptomatic cholelithiasis, complicated by pertinent comorbidities including obesity and current tobacco abuse (smoking).    - Admit to general surgery   - NPO, IVF  - Will get stat MRCP to evaluate LFT elevation  - Pain control prn, antiemetics prn   - Will plan for laparoscopic cholecystectomy with intraoperative cholangiogram with Dr Aleen Campi pending OR/Anesthesia availability and MRCP results  - All risks, benefits, and alternatives to above procedure(s) were discussed with the patient, all of her questions were answered to her expressed satisfaction, patient expresses she wishes to proceed, and informed consent was obtained.  All of the above findings and recommendations were discussed with the patient and the medical team.  -- Lynden Oxford, PA-C [facility] 11/04/2018, 7:48 AM [phone] M-F: 7am - 4pm

## 2018-11-04 NOTE — Anesthesia Postprocedure Evaluation (Signed)
Anesthesia Post Note  Patient: Terri [name]  Procedure(s) Performed: LAPAROSCOPIC CHOLECYSTECTOMY (N/A )  Patient location during evaluation: PACU Anesthesia Type: General Level of consciousness: awake and alert Pain management: pain level controlled Vital Signs Assessment: post-procedure vital signs reviewed and stable Respiratory status: spontaneous breathing, nonlabored ventilation, respiratory function stable and patient connected to nasal cannula oxygen Cardiovascular status: blood pressure returned to baseline and stable Postop Assessment: no apparent nausea or vomiting Anesthetic complications: no     Last Vitals:  Vitals:   11/04/18 1758 11/04/18 1823  BP: 119/76 121/84  Pulse: 80 78  Resp: (!) 31 17  Temp: 36.6 C 36.7 C  SpO2: 93% 93%    Last Pain:  Vitals:   11/04/18 1823  TempSrc: Oral  PainSc:                  Lenard Simmer

## 2018-11-04 NOTE — ED Notes (Addendum)
ED TO INPATIENT HANDOFF REPORT  ED Nurse Name and Phone #: Eileen Stanford 18  S Name/Age/Gender Terri [name] 23 y.o. female Room/Bed: ED01A/ED01A  Code Status   Code Status: Full Code  Home/SNF/Other Home Patient oriented to: self, place, time and situation Is this baseline? Yes   Triage Complete: Triage complete  Chief Complaint Abdominal pain  Triage Note Patient ambulatory to triage with steady gait, without difficulty, pt tearful; pt reports right upper abd pain radiating around into back x month, worse tonight accomp by N/V; st hx of same and told coming from her gallbladder and liver and needs an ERCP   Allergies Allergies  Allergen Reactions  . Sulfa Antibiotics Other (See Comments)    Reaction: unknown.  Told allergic as child but believes she's taken since.  . Zithromax [Azithromycin] Other (See Comments)    Reaction: unknown Told reaction as a child    Level of Care/Admitting Diagnosis ED Disposition    ED Disposition Condition Comment   Admit  Hospital Area: [facility] [[id number]]  Level of Care: Med-Surg [16]  Diagnosis: Symptomatic cholelithiasis [161096]  Admitting Physician: Henrene Dodge [[id number]]  Attending Physician: Henrene Dodge [[id number]]  PT Class (Do Not Modify): Observation [104]  PT Acc Code (Do Not Modify): Observation [10022]       B Medical/Surgery History History reviewed. No pertinent past medical history. Past Surgical History:  Procedure Laterality Date  . DILATION AND EVACUATION N/A 07/13/2017   Procedure: DILATATION AND EVACUATION;  Surgeon: Ward, Elenora Fender, MD;  Location: [facility] ORS;  Service: Gynecology;  Laterality: N/A;     A IV Location/Drains/Wounds Patient Lines/Drains/Airways Status   Active Line/Drains/Airways    Name:   Placement date:   Placement time:   Site:   Days:   Peripheral IV 11/04/18 Left Antecubital   11/04/18    0012    Antecubital   less than 1   Incision (Closed) 07/13/17 Vagina    07/13/17    0750     479          Intake/Output Last 24 hours No intake or output data in the 24 hours ending 11/04/18 0334  Labs/Imaging Results for orders placed or performed during the hospital encounter of 11/04/18 (from the past 48 hour(s))  CBC with Differential     Status: Abnormal   Collection Time: 11/03/18 11:17 PM  Result Value Ref Range   WBC 6.3 4.0 - 10.5 K/uL   RBC 5.22 (H) 3.87 - 5.11 MIL/uL   Hemoglobin 14.2 12.0 - 15.0 g/dL   HCT 29.5 62.1 - 30.8 %   MCV 81.4 80.0 - 100.0 fL   MCH 27.2 26.0 - 34.0 pg   MCHC 33.4 30.0 - 36.0 g/dL   RDW 65.7 84.6 - 96.2 %   Platelets 201 150 - 400 K/uL   nRBC 0.0 0.0 - 0.2 %   Neutrophils Relative % 58 %   Neutro Abs 3.6 1.7 - 7.7 K/uL   Lymphocytes Relative 28 %   Lymphs Abs 1.8 0.7 - 4.0 K/uL   Monocytes Relative 9 %   Monocytes Absolute 0.6 0.1 - 1.0 K/uL   Eosinophils Relative 4 %   Eosinophils Absolute 0.2 0.0 - 0.5 K/uL   Basophils Relative 1 %   Basophils Absolute 0.0 0.0 - 0.1 K/uL   Immature Granulocytes 0 %   Abs Immature Granulocytes 0.02 0.00 - 0.07 K/uL    Comment: Performed at [facility], [address] [place], [place]  [address]  Comprehensive metabolic panel     Status: Abnormal   Collection Time: 11/03/18 11:17 PM  Result Value Ref Range   Sodium 139 135 - 145 mmol/L   Potassium 3.7 3.5 - 5.1 mmol/L   Chloride 106 98 - 111 mmol/L   CO2 23 22 - 32 mmol/L   Glucose, Bld 117 (H) 70 - 99 mg/dL   BUN 11 6 - 20 mg/dL   Creatinine, Ser 5.36 0.44 - 1.00 mg/dL   Calcium 9.6 8.9 - 46.8 mg/dL   Total Protein 8.4 (H) 6.5 - 8.1 g/dL   Albumin 4.3 3.5 - 5.0 g/dL   AST 032 (H) 15 - 41 U/L   ALT 259 (H) 0 - 44 U/L   Alkaline Phosphatase 223 (H) 38 - 126 U/L   Total Bilirubin 0.8 0.3 - 1.2 mg/dL   GFR calc non Af Amer >60 >60 mL/min   GFR calc Af Amer >60 >60 mL/min   Anion gap 10 5 - 15    Comment: Performed at [facility], [address] [place], [place] [address]  Troponin I - ONCE  - STAT     Status: None   Collection Time: 11/03/18 11:17 PM  Result Value Ref Range   Troponin I <0.03 <0.03 ng/mL    Comment: Performed at [facility], [address] [place], [place] [address]  Lipase, blood     Status: None   Collection Time: 11/03/18 11:17 PM  Result Value Ref Range   Lipase 25 11 - 51 U/L    Comment: Performed at [facility], [address] [place], [place] [address]  Urinalysis, Complete w Microscopic     Status: Abnormal   Collection Time: 11/03/18 11:17 PM  Result Value Ref Range   Color, Urine AMBER (A) YELLOW    Comment: BIOCHEMICALS MAY BE AFFECTED BY COLOR   APPearance CLEAR (A) CLEAR   Specific Gravity, Urine 1.029 1.005 - 1.030   pH 5.0 5.0 - 8.0   Glucose, UA NEGATIVE NEGATIVE mg/dL   Hgb urine dipstick NEGATIVE NEGATIVE   Bilirubin Urine SMALL (A) NEGATIVE   Ketones, ur NEGATIVE NEGATIVE mg/dL   Protein, ur NEGATIVE NEGATIVE mg/dL   Nitrite NEGATIVE NEGATIVE   Leukocytes,Ua NEGATIVE NEGATIVE   RBC / HPF 6-10 0 - 5 RBC/hpf   WBC, UA 0-5 0 - 5 WBC/hpf   Bacteria, UA NONE SEEN NONE SEEN   Squamous Epithelial / LPF 0-5 0 - 5   Mucus PRESENT    Ca Oxalate Crys, UA PRESENT     Comment: Performed at [facility], [address] [place], [place] [address]  Pregnancy, urine POC     Status: None   Collection Time: 11/03/18 11:19 PM  Result Value Ref Range   Preg Test, Ur NEGATIVE NEGATIVE    Comment:        THE SENSITIVITY OF THIS METHODOLOGY IS >24 mIU/mL    US Abdomen Limited Ruq  Result Date: 11/04/2018 CLINICAL DATA:  RIGHT upper quadrant pain radiating to back for months, worsening tonight. Nausea and vomiting. History of cholelithiasis. EXAM: ULTRASOUND ABDOMEN LIMITED RIGHT UPPER QUADRANT COMPARISON:  RIGHT upper quadrant ultrasound October 28, 2018 FINDINGS: Gallbladder: Multiple echogenic gallstones measuring to 5 mm with acoustic shadowing. No gallbladder wall thickening or pericholecystic fluid. No  sonographic Murphy sign elicited though patient had received pain medication per technologist note. Common bile duct: Diameter: Three-view. Liver: No focal lesion identified. Normal parenchymal echogenicity. Portal vein is  patent on color Doppler imaging with normal direction of blood flow towards the liver. IMPRESSION: Cholelithiasis without sonographic findings of acute cholecystitis. Electronically Signed   By: Awilda Metro M.D.   On: 11/04/2018 02:29    Pending Labs Unresulted Labs (From admission, onward)    Start     Ordered   11/04/18 0500  Magnesium  Tomorrow morning,   STAT     11/04/18 0322   11/04/18 0500  CBC  Tomorrow morning,   STAT     11/04/18 0322   11/04/18 0500  Comprehensive metabolic panel  Tomorrow morning,   STAT     11/04/18 0322   11/04/18 0319  HIV antibody (Routine Testing)  Once,   STAT     11/04/18 0322   11/04/18 0049  Hepatitis panel, acute  Add-on,   AD     11/04/18 0049          Vitals/Pain Today's Vitals   11/04/18 0100 11/04/18 0113 11/04/18 0213 11/04/18 0230  BP: 124/84  (!) 157/83 137/87  Pulse: 84  87 84  Resp: 19  18   Temp:      TempSrc:      SpO2: 96%  94% 94%  PainSc:  6       Isolation Precautions No active isolations  Medications Medications  lactated ringers infusion (has no administration in time range)  acetaminophen (TYLENOL) tablet 1,000 mg (has no administration in time range)  ketorolac (TORADOL) 30 MG/ML injection 30 mg (has no administration in time range)  oxyCODONE (Oxy IR/ROXICODONE) immediate release tablet 5-10 mg (has no administration in time range)  HYDROmorphone (DILAUDID) injection 0.5 mg (has no administration in time range)  polyethylene glycol (MIRALAX / GLYCOLAX) packet 17 g (has no administration in time range)  ondansetron (ZOFRAN-ODT) disintegrating tablet 4 mg (has no administration in time range)    Or  ondansetron (ZOFRAN) injection 4 mg (has no administration in time range)  pantoprazole  (PROTONIX) injection 40 mg (has no administration in time range)  enoxaparin (LOVENOX) injection 40 mg (has no administration in time range)  ondansetron (ZOFRAN) injection 4 mg (4 mg Intravenous Given 11/04/18 0014)  HYDROmorphone (DILAUDID) injection 1 mg (1 mg Intravenous Given 11/04/18 0043)  morphine 4 MG/ML injection 4 mg (4 mg Intravenous Given 11/04/18 0325)  ketorolac (TORADOL) 30 MG/ML injection 15 mg (15 mg Intravenous Given 11/04/18 0325)    Mobility walks Low fall risk   Focused Assessments GI   R Recommendations: See Admitting Provider Note  Report given to: Jeannett Senior, RN  Additional Notes:

## 2018-11-04 NOTE — Transfer of Care (Signed)
Immediate Anesthesia Transfer of Care Note  Patient: Terri [name]  Procedure(s) Performed: Procedure(s): LAPAROSCOPIC CHOLECYSTECTOMY (N/A)  Patient Location: PACU  Anesthesia Type:General  Level of Consciousness: sedated  Airway & Oxygen Therapy: Patient Spontanous Breathing and Patient connected to face mask oxygen  Post-op Assessment: Report given to RN and Post -op Vital signs reviewed and stable  Post vital signs: Reviewed and stable  Last Vitals:  Vitals:   11/04/18 1432 11/04/18 1715  BP: 124/77 123/74  Pulse:    Resp:  (!) 22  Temp:  (!) 36.3 C  SpO2:  100%    Complications: No apparent anesthesia complications

## 2018-11-04 NOTE — Progress Notes (Signed)
Pharmacy Antibiotic Note  Terri [name] is a 23 y.o. female admitted on 11/04/2018 with Intra-abdominal infection.  Pharmacy has been consulted for pip/tazo dosing.  Plan: Zosyn 3.375g IV q8h (4 hour infusion).  Height: 5\' 3"  (160 cm) Weight: 209 lb 4.8 oz (94.9 kg) IBW/kg (Calculated) : 52.4  Temp (24hrs), Avg:98.4 F (36.9 C), Min:98.1 F (36.7 C), Max:98.7 F (37.1 C)  Recent Labs  Lab 11/03/18 2317 11/04/18 0534  WBC 6.3 6.0  CREATININE 0.58 0.45    Estimated Creatinine Clearance: 118.8 mL/min (by C-G formula based on SCr of 0.45 mg/dL).    Allergies  Allergen Reactions  . Sulfa Antibiotics Other (See Comments)    Reaction: unknown.  Told allergic as child but believes she's taken since.  Marland Kitchen Zithromax [Azithromycin] Other (See Comments)    Reaction: unknown Told reaction as a child    Antimicrobials this admission: 3/6 ancef x 1 3/6 pip/tazo >>   Dose adjustments this admission: none  Microbiology results:  Thank you for allowing pharmacy to be a part of this patient's care.  Ronnald Ramp, PharmD, BCPS Clinical Pharmacist  11/04/2018 5:15 PM

## 2018-11-04 NOTE — ED Notes (Addendum)
Patient c/o epigastric pain radiating to back, N/V and constipation. Patient does not remember last BM, but believes it been approx 1 week.   Patient reports symptoms began approx 1 month ago. Patient reports recent admission to [facility] for same.

## 2018-11-04 NOTE — Discharge Summary (Signed)
Patient ID: Terri [name] MRN: [id number] DOB/AGE: September 12, 1995 23 y.o.  Admit date: 11/04/2018 Discharge date: 11/05/2018   Discharge Diagnoses:  Active Problems:     Calculus of gallbladder with acute cholecystitis without obstruction   Procedures:  Laparoscopic cholecystectomy  Hospital Course:  Patient was admitted on 11/04/18 with symptomatic cholelithiasis, with persistent pain without improvement in ED after pain medication.  Her AST/ALT were elevated and her total bilirubin had been elevated in the past, so MRCP was ordered which did not show any choledocholithiasis.  She was taken to the OR and underwent laparoscopic cholecystectomy.  Intraoperatively, she had acute cholecystitis with hydrops.  Her diet was slowly advanced, and she tolerated well.  Her pain was well controlled, was ambulating, and deemed ready for discharge.  Consults: None  Disposition:  Home, self-care  Discharge Instructions    Call MD for:  difficulty breathing, headache or visual disturbances   Complete by:  As directed    Call MD for:  persistant nausea and vomiting   Complete by:  As directed    Call MD for:  redness, tenderness, or signs of infection (pain, swelling, redness, odor or green/yellow discharge around incision site)   Complete by:  As directed    Call MD for:  severe uncontrolled pain   Complete by:  As directed    Call MD for:  temperature >100.4   Complete by:  As directed    Diet - low sodium heart healthy   Complete by:  As directed    Discharge instructions   Complete by:  As directed    1.  Patient may shower, but do not scrub wounds heavily and dab dry only. 2.  Do not submerge wounds in pool/tub for 1 week. 3.  Do not apply ointments or hydrogen peroxide to the wounds.   Driving Restrictions   Complete by:  As directed    Do not drive while taking narcotics for pain control.   Increase activity slowly   Complete by:  As directed    Lifting restrictions   Complete by:  As  directed    No heavy lifting or pushing of more than 10-15 lbs for 4 weeks.   No dressing needed   Complete by:  As directed      Allergies as of 11/04/2018      Reactions   Sulfa Antibiotics Other (See Comments)   Reaction: unknown.  Told allergic as child but believes she's taken since.   Zithromax [azithromycin] Other (See Comments)   Reaction: unknown Told reaction as a child      Medication List    STOP taking these medications   prochlorperazine 10 MG tablet Commonly known as:  COMPAZINE   traMADol 50 MG tablet Commonly known as:  ULTRAM     TAKE these medications   amoxicillin-clavulanate 875-125 MG tablet Commonly known as:  Augmentin Take 1 tablet by mouth 2 (two) times daily for 10 days.   diphenhydrAMINE 50 MG capsule Commonly known as:  BENADRYL Take 50 mg by mouth at bedtime as needed for sleep.   ibuprofen 600 MG tablet Commonly known as:  ADVIL,MOTRIN Take 1 tablet (600 mg total) by mouth every 8 (eight) hours as needed for fever, mild pain or moderate pain. What changed:    medication strength  how much to take  when to take this  reasons to take this   oxyCODONE 5 MG immediate release tablet Commonly known as:  Oxy IR/ROXICODONE Take 1 tablet (  5 mg total) by mouth every 4 (four) hours as needed for severe pain.      Follow-up Information    Miyu Fenderson, Elita Quick, MD Follow up in 2 week(s).   Specialty:  General Surgery Contact information: [address] [place] [address] [phone]

## 2018-11-04 NOTE — Anesthesia Procedure Notes (Signed)
Procedure Name: Intubation Date/Time: 11/04/2018 3:14 PM Performed by: Jonna Clark, CRNA Pre-anesthesia Checklist: Patient identified, Patient being monitored, Timeout performed, Emergency Drugs available and Suction available Patient Re-evaluated:Patient Re-evaluated prior to induction Oxygen Delivery Method: Circle system utilized Preoxygenation: Pre-oxygenation with 100% oxygen Induction Type: IV induction Ventilation: Mask ventilation without difficulty Laryngoscope Size: Mac and 3 Grade View: Grade I Tube type: Oral Tube size: 7.0 mm Number of attempts: 1 Airway Equipment and Method: Stylet Placement Confirmation: ETT inserted through vocal cords under direct vision,  positive ETCO2 and breath sounds checked- equal and bilateral Secured at: 21 cm Tube secured with: Tape Dental Injury: Teeth and Oropharynx as per pre-operative assessment

## 2018-11-04 NOTE — Anesthesia Post-op Follow-up Note (Signed)
Anesthesia QCDR form completed.        

## 2018-11-05 LAB — HEPATITIS PANEL, ACUTE
Hep A IgM: NEGATIVE
Hep B C IgM: NEGATIVE
Hepatitis B Surface Ag: NEGATIVE

## 2018-11-05 LAB — HIV ANTIBODY (ROUTINE TESTING W REFLEX): HIV SCREEN 4TH GENERATION: NONREACTIVE

## 2018-11-05 MED ORDER — SODIUM CHLORIDE 0.9 % IV SOLN
INTRAVENOUS | Status: DC | PRN
  Administered 2018-11-05: 250 mL via INTRAVENOUS

## 2018-11-05 NOTE — Progress Notes (Signed)
Pt discharged per MD order. IV removed. Discharge instructions reviewed with pt. Pt verbalized understanding. All questions answered to pt satisfaction. Pt taken to car in wheelchair by staff.  ?

## 2018-11-06 ENCOUNTER — Encounter: Payer: Self-pay | Admitting: Surgery

## 2018-11-06 NOTE — Op Note (Signed)
  Procedure Date:  11/04/2018  Pre-operative Diagnosis:  Symptomatic cholelithiasis  Post-operative Diagnosis:  Acute cholecystitis  Procedure:  Laparoscopic cholecystectomy  Surgeon:  Howie Ill, MD  Assistant:  Hulda Marin, MD.  His assistance was critical due to significant inflammation found intraoperatively, obesity which made it difficult to do cutdown procedure to enter the abdomen, and due to lack of assistance to help hold camera.  Anesthesia:  General endotracheal  Estimated Blood Loss:  30 ml  Specimens:  gallbladder  Complications:  None  Indications for Procedure:  This is a 23 y.o. female who presents with abdominal pain and workup revealing symptomatic cholelithiasis.  The benefits, complications, treatment options, and expected outcomes were discussed with the patient. The risks of bleeding, infection, recurrence of symptoms, failure to resolve symptoms, bile duct damage, bile duct leak, retained common bile duct stone, bowel injury, and need for further procedures were all discussed with the patient and she was willing to proceed.  Description of Procedure: The patient was correctly identified in the preoperative area and brought into the operating room.  The patient was placed supine with VTE prophylaxis in place.  Appropriate time-outs were performed.  Anesthesia was induced and the patient was intubated.  Appropriate antibiotics were infused.  The abdomen was prepped and draped in a sterile fashion. An infraumbilical incision was made. A cutdown technique was used to enter the abdominal cavity without injury, and a Hasson trocar was inserted.  The abdominal wall was deep and required assistance from Dr. Thelma Barge.  Pneumoperitoneum was obtained with appropriate opening pressures.  A 5-mm port was placed in the subxiphoid area and two 5-mm ports were placed in the right upper quadrant under direct visualization.  The gallbladder was identified and was significantly  inflamed and distended.  It required needle decompression, revealing clear fluid consistent with hydrops.  The fundus was grasped and retracted cephalad.  Adhesions were lysed bluntly and with electrocautery. The infundibulum was grasped and retracted laterally, exposing the peritoneum overlying the gallbladder.  This was incised with electrocautery and extended on either side of the gallbladder.  After careful and thorough dissection, the cystic duct and cystic artery were clearly identified and bluntly dissected.  Both were clipped twice proximally and once distally, cutting in between.  The gallbladder was taken from the gallbladder fossa in a retrograde fashion with electrocautery. The gallbladder was placed in an Endocatch bag. The liver bed was inspected and any bleeding was controlled with electrocautery. The right upper quadrant was then inspected again revealing intact clips, no bleeding, and no ductal injury.  The area was thoroughly irrigated.  The 5 mm ports were removed under direct visualization and the Hasson trocar was removed.  The Endocatch bag was brought out via the umbilical incision. The fascial opening was closed using 0 vicryl suture.  Local anesthetic was infused in all incisions.  The umbilical incision was closed in two layers with 3-0 Vicryl and 4-0 Monocryl.  The remaining incisions were closed with 4-0 Monocryl.  The wounds were cleaned and sealed with DermaBond.  The patient was emerged from anesthesia and extubated and brought to the recovery room for further management.  The patient tolerated the procedure well and all counts were correct at the end of the case.   Howie Ill, MD

## 2018-11-08 LAB — SURGICAL PATHOLOGY

## 2018-11-09 ENCOUNTER — Ambulatory Visit: Payer: Self-pay | Admitting: Surgery

## 2018-11-13 ENCOUNTER — Encounter: Payer: Self-pay | Admitting: Emergency Medicine

## 2018-11-13 ENCOUNTER — Emergency Department: Payer: Medicaid Other

## 2018-11-13 ENCOUNTER — Inpatient Hospital Stay
Admission: EM | Admit: 2018-11-13 | Discharge: 2018-11-15 | DRG: 446 | Disposition: A | Discharge status: Home / Self Care | Payer: Medicaid Other | Attending: Surgery | Admitting: Surgery

## 2018-11-13 ENCOUNTER — Other Ambulatory Visit: Payer: Self-pay

## 2018-11-13 DIAGNOSIS — Z9049 Acquired absence of other specified parts of digestive tract: Secondary | ICD-10-CM | POA: Diagnosis not present

## 2018-11-13 DIAGNOSIS — R17 Unspecified jaundice: Secondary | ICD-10-CM | POA: Diagnosis not present

## 2018-11-13 DIAGNOSIS — Z6837 Body mass index (BMI) 37.0-37.9, adult: Secondary | ICD-10-CM

## 2018-11-13 DIAGNOSIS — E669 Obesity, unspecified: Secondary | ICD-10-CM | POA: Diagnosis present

## 2018-11-13 DIAGNOSIS — Z881 Allergy status to other antibiotic agents status: Secondary | ICD-10-CM | POA: Diagnosis not present

## 2018-11-13 DIAGNOSIS — K8050 Calculus of bile duct without cholangitis or cholecystitis without obstruction: Principal | ICD-10-CM

## 2018-11-13 DIAGNOSIS — F17210 Nicotine dependence, cigarettes, uncomplicated: Secondary | ICD-10-CM | POA: Diagnosis present

## 2018-11-13 DIAGNOSIS — Z882 Allergy status to sulfonamides status: Secondary | ICD-10-CM | POA: Diagnosis not present

## 2018-11-13 HISTORY — DX: Unspecified jaundice: R17

## 2018-11-13 LAB — URINALYSIS, COMPLETE (UACMP) WITH MICROSCOPIC
Bacteria, UA: NONE SEEN
Glucose, UA: NEGATIVE mg/dL
Hgb urine dipstick: NEGATIVE
KETONES UR: 20 mg/dL — AB
Leukocytes,Ua: NEGATIVE
Nitrite: NEGATIVE
Protein, ur: 30 mg/dL — AB
Specific Gravity, Urine: 1.021 (ref 1.005–1.030)
pH: 6 (ref 5.0–8.0)

## 2018-11-13 LAB — CBC
HCT: 43.3 % (ref 36.0–46.0)
Hemoglobin: 14.7 g/dL (ref 12.0–15.0)
MCH: 27.7 pg (ref 26.0–34.0)
MCHC: 33.9 g/dL (ref 30.0–36.0)
MCV: 81.7 fL (ref 80.0–100.0)
Platelets: 289 10*3/uL (ref 150–400)
RBC: 5.3 MIL/uL — ABNORMAL HIGH (ref 3.87–5.11)
RDW: 13.7 % (ref 11.5–15.5)
WBC: 6.5 10*3/uL (ref 4.0–10.5)
nRBC: 0 % (ref 0.0–0.2)

## 2018-11-13 LAB — COMPREHENSIVE METABOLIC PANEL
ALK PHOS: 235 U/L — AB (ref 38–126)
ALT: 401 U/L — AB (ref 0–44)
AST: 244 U/L — ABNORMAL HIGH (ref 15–41)
Albumin: 4.2 g/dL (ref 3.5–5.0)
Anion gap: 10 (ref 5–15)
BUN: 8 mg/dL (ref 6–20)
CALCIUM: 9.5 mg/dL (ref 8.9–10.3)
CO2: 25 mmol/L (ref 22–32)
Chloride: 103 mmol/L (ref 98–111)
Creatinine, Ser: 0.41 mg/dL — ABNORMAL LOW (ref 0.44–1.00)
GFR calc Af Amer: >60 mL/min (ref >60)
GFR calc non Af Amer: >60 mL/min (ref >60)
GLUCOSE: 98 mg/dL (ref 70–99)
Potassium: 3.7 mmol/L (ref 3.5–5.1)
Sodium: 138 mmol/L (ref 135–145)
Total Bilirubin: 5.8 mg/dL — ABNORMAL HIGH (ref 0.3–1.2)
Total Protein: 8.6 g/dL — ABNORMAL HIGH (ref 6.5–8.1)

## 2018-11-13 LAB — LIPASE, BLOOD: Lipase: 22 U/L (ref 11–51)

## 2018-11-13 LAB — POCT PREGNANCY, URINE: PREG TEST UR: NEGATIVE

## 2018-11-13 LAB — BILIRUBIN, DIRECT: Bilirubin, Direct: 3.6 mg/dL — ABNORMAL HIGH (ref 0.0–0.2)

## 2018-11-13 MED ORDER — DOCUSATE SODIUM 100 MG PO CAPS: 100.0000 mg | ORAL_CAPSULE | Freq: Two times a day (BID) | ORAL | Status: DC | PRN

## 2018-11-13 MED ORDER — ONDANSETRON 4 MG PO TBDP: 4.0000 mg | ORAL_TABLET | Freq: Four times a day (QID) | ORAL | Status: DC | PRN

## 2018-11-13 MED ORDER — DIPHENHYDRAMINE HCL 25 MG PO CAPS
50.0000 mg | ORAL_CAPSULE | Freq: Every evening | ORAL | Status: DC | PRN
  Administered 2018-11-13 – 2018-11-15 (×2): 50 mg via ORAL
  Filled 2018-11-13 (×2): qty 2

## 2018-11-13 MED ORDER — IBUPROFEN 400 MG PO TABS
400.0000 mg | ORAL_TABLET | Freq: Once | ORAL | Status: AC
  Administered 2018-11-13: 400 mg via ORAL
  Filled 2018-11-13: qty 1

## 2018-11-13 MED ORDER — HYDROCODONE-ACETAMINOPHEN 5-325 MG PO TABS
1.0000 | ORAL_TABLET | ORAL | Status: DC | PRN
  Administered 2018-11-13: 1 via ORAL
  Filled 2018-11-13: qty 1

## 2018-11-13 MED ORDER — SODIUM CHLORIDE 0.9 % IV SOLN
INTRAVENOUS | Status: DC | PRN
  Administered 2018-11-13 – 2018-11-14 (×2): 250 mL via INTRAVENOUS

## 2018-11-13 MED ORDER — ONDANSETRON HCL 4 MG/2ML IJ SOLN
4.0000 mg | Freq: Four times a day (QID) | INTRAMUSCULAR | Status: DC | PRN
  Administered 2018-11-13 – 2018-11-14 (×4): 4 mg via INTRAVENOUS
  Filled 2018-11-13 (×4): qty 2

## 2018-11-13 MED ORDER — TRAMADOL HCL 50 MG PO TABS: 50.0000 mg | ORAL_TABLET | Freq: Four times a day (QID) | ORAL | Status: DC | PRN

## 2018-11-13 MED ORDER — PANTOPRAZOLE SODIUM 40 MG IV SOLR
40.0000 mg | Freq: Every day | INTRAVENOUS | Status: DC
  Administered 2018-11-13 – 2018-11-14 (×2): 40 mg via INTRAVENOUS
  Filled 2018-11-13 (×2): qty 40

## 2018-11-13 MED ORDER — MORPHINE SULFATE (PF) 2 MG/ML IV SOLN
2.0000 mg | INTRAVENOUS | Status: DC | PRN
  Administered 2018-11-14 (×4): 2 mg via INTRAVENOUS
  Filled 2018-11-13 (×4): qty 1

## 2018-11-13 MED ORDER — LACTATED RINGERS IV SOLN
INTRAVENOUS | Status: DC
  Administered 2018-11-13 – 2018-11-15 (×5): via INTRAVENOUS

## 2018-11-13 MED ORDER — SODIUM CHLORIDE 0.9 % IV SOLN
2.0000 g | INTRAVENOUS | Status: DC
  Administered 2018-11-13 – 2018-11-14 (×2): 2 g via INTRAVENOUS
  Filled 2018-11-13: qty 20
  Filled 2018-11-13 (×2): qty 2

## 2018-11-13 NOTE — ED Provider Notes (Signed)
[facility] Emergency Department Provider Note   ____________________________________________    I have reviewed the triage vital signs and the nursing notes.   HISTORY  Chief Complaint Post-op Problem     HPI Terri [name] is a 23 y.o. female who presents with complaints of yellowish discoloration of her eyes.  Patient reports she had a cholecystectomy 1 week ago, has been healing well status post surgery however yesterday noticed a mild yellowish discoloration of her eyes, she felt this was worse today.  Her significant other agreed with her so she presented for evaluation.  She denies abdominal pain.  No fevers or chills.   does describe itching of her hands bilaterally.  Some nausea  History reviewed. No pertinent past medical history.  Patient Active Problem List   Diagnosis Date Noted  . Symptomatic cholelithiasis 11/04/2018  . Calculus of gallbladder with acute cholecystitis without obstruction     Past Surgical History:  Procedure Laterality Date  . CHOLECYSTECTOMY N/A 11/04/2018   Procedure: LAPAROSCOPIC CHOLECYSTECTOMY;  Surgeon: Henrene Dodge, MD;  Location: [facility] ORS;  Service: General;  Laterality: N/A;  . DILATION AND EVACUATION N/A 07/13/2017   Procedure: DILATATION AND EVACUATION;  Surgeon: Ward, Elenora Fender, MD;  Location: [facility] ORS;  Service: Gynecology;  Laterality: N/A;    Prior to Admission medications   Medication Sig Start Date End Date Taking? Authorizing Provider  amoxicillin-clavulanate (AUGMENTIN) 875-125 MG tablet Take 1 tablet by mouth 2 (two) times daily for 10 days. 11/04/18 11/14/18  Henrene Dodge, MD  diphenhydrAMINE (BENADRYL) 50 MG capsule Take 50 mg by mouth at bedtime as needed for sleep.    [provider]  ibuprofen (ADVIL,MOTRIN) 600 MG tablet Take 1 tablet (600 mg total) by mouth every 8 (eight) hours as needed for fever, mild pain or moderate pain. 11/04/18   Henrene Dodge, MD  oxyCODONE (OXY  IR/ROXICODONE) 5 MG immediate release tablet Take 1 tablet (5 mg total) by mouth every 4 (four) hours as needed for severe pain. 11/04/18   Henrene Dodge, MD     Allergies Sulfa antibiotics and Zithromax [azithromycin]  History reviewed. No pertinent family history.  Social History Social History   Tobacco Use  . Smoking status: Current Every Day Smoker    Packs/day: 0.50    Types: Cigarettes  . Smokeless tobacco: Never Used  Substance Use Topics  . Alcohol use: No  . Drug use: No    Review of Systems  Constitutional: No fever/chills Eyes: As above ENT: No sore throat. Cardiovascular: Denies chest pain. Respiratory: Denies shortness of breath. Gastrointestinal: No abdominal pain.  Mild nausea Genitourinary: Negative for dysuria. Musculoskeletal: Negative for back pain. Skin: Negative for rash. Neurological: Negative for headaches   ____________________________________________   PHYSICAL EXAM:  VITAL SIGNS: ED Triage Vitals  Enc Vitals Group     BP 11/13/18 1637 118/79     Pulse Rate 11/13/18 1637 100     Resp 11/13/18 1637 18     Temp 11/13/18 1637 98.4 F (36.9 C)     Temp Source 11/13/18 1637 Oral     SpO2 11/13/18 1637 97 %     Weight 11/13/18 1638 94.9 kg (209 lb 4.8 oz)     Height 11/13/18 1638 1.6 m (5\' 3" )     Head Circumference --      Peak Flow --      Pain Score 11/13/18 1637 0     Pain Loc --  Pain Edu? --      Excl. in GC? --     Constitutional: Alert and oriented. No acute distress. Pleasant and interactive Eyes: Scleral icterus  Nose: No congestion/rhinnorhea. Mouth/Throat: Mucous membranes are moist.    Cardiovascular: Normal rate, regular rhythm. Grossly normal heart sounds.  Good peripheral circulation. Respiratory: Normal respiratory effort.  No retractions. Lungs CTAB. Gastrointestinal: Soft and nontender. No distention.  No CVA tenderness.  Incision sites are well-healed, no tenderness to palpation  Musculoskeletal:   Warm  and well perfused Neurologic:  Normal speech and language. No gross focal neurologic deficits are appreciated.  Skin:  Skin is warm, dry and intact. No rash noted.   ____________________________________________   LABS (all labs ordered are listed, but only abnormal results are displayed)  Labs Reviewed  COMPREHENSIVE METABOLIC PANEL - Abnormal; Notable for the following components:      Result Value   Creatinine, Ser 0.41 (*)    Total Protein 8.6 (*)    AST 244 (*)    ALT 401 (*)    Alkaline Phosphatase 235 (*)    Total Bilirubin 5.8 (*)    All other components within normal limits  CBC - Abnormal; Notable for the following components:   RBC 5.30 (*)    All other components within normal limits  URINALYSIS, COMPLETE (UACMP) WITH MICROSCOPIC - Abnormal; Notable for the following components:   Color, Urine AMBER (*)    APPearance CLEAR (*)    Bilirubin Urine MODERATE (*)    Ketones, ur 20 (*)    Protein, ur 30 (*)    All other components within normal limits  LIPASE, BLOOD  POC URINE PREG, ED  POCT PREGNANCY, URINE   ____________________________________________  EKG  None ____________________________________________  RADIOLOGY  Sound right upper quadrant ____________________________________________   PROCEDURES  Procedure(s) performed: No  Procedures   Critical Care performed: No ____________________________________________   INITIAL IMPRESSION / ASSESSMENT AND PLAN / ED COURSE  Pertinent labs & imaging results that were available during my care of the patient were reviewed by me and considered in my medical decision making (see chart for details).  Patient presents with jaundice, confirmed on exam.  Given recent cholecystectomy will send for ultrasound for possible biloma.  Bilirubin level is 5.8, elevated LFTs as well  Ultrasound demonstrates dilated common bile duct suspicious for choledocholithiasis, discussed with Dr. Tonna Boehringer of surgery as well as  Dr. Allegra Lai of GI, Dr. Allegra Lai notes that ERCP will be available tomorrow recommends admission n.p.o. after midnight.  Discussed with patient who agrees with this plan   ____________________________________________   FINAL CLINICAL IMPRESSION(S) / ED DIAGNOSES  Final diagnoses:  Jaundice  Choledocholithiasis        Note:  This document was prepared using Dragon voice recognition software and may include unintentional dictation errors.   Jene Every, MD 11/13/18 1944

## 2018-11-13 NOTE — ED Notes (Addendum)
ED TO INPATIENT HANDOFF REPORT  ED Nurse Name and Phone #: Geraldine Contras [phone]  S Name/Age/Gender Terri [name] 23 y.o. female Room/Bed: ED26A/ED26A  Code Status   Code Status: Prior  Home/SNF/Other Home Patient oriented to: self, place, time and situation Is this baseline? Yes   Triage Complete: Triage complete  Chief Complaint Post Op Problems  Triage Note Pt c/o itching and nausea for 2 days. Pt feels her eyes look yellow as well. Had gallbladder out last week.  Called nurse line yesterday and they told her to come yesterday but she needed one more night of sleep so decided to wait until today.  No pain right now.  No fevers. Reports had MRCP and was told there were no stones stuck in duct when took gallbladder out. Sclera appear to have yellow tint to them.     Allergies Allergies  Allergen Reactions  . Sulfa Antibiotics Other (See Comments)    Reaction: unknown.  Told allergic as child but believes she's taken since.  Marland Kitchen Zithromax [Azithromycin] Other (See Comments)    Reaction: unknown Told reaction as a child    Level of Care/Admitting Diagnosis ED Disposition    ED Disposition Condition Comment   Admit  The patient appears reasonably stabilized for admission considering the current resources, flow, and capabilities available in the ED at this time, and I doubt any other [facility] requiring further screening and/or treatment in the ED prior to admission is  present.       B Medical/Surgery History History reviewed. No pertinent past medical history. Past Surgical History:  Procedure Laterality Date  . CHOLECYSTECTOMY N/A 11/04/2018   Procedure: LAPAROSCOPIC CHOLECYSTECTOMY;  Surgeon: Henrene Dodge, MD;  Location: [facility] ORS;  Service: General;  Laterality: N/A;  . DILATION AND EVACUATION N/A 07/13/2017   Procedure: DILATATION AND EVACUATION;  Surgeon: Ward, Elenora Fender, MD;  Location: [facility] ORS;  Service: Gynecology;  Laterality: N/A;     A IV  Location/Drains/Wounds Patient Lines/Drains/Airways Status   Active Line/Drains/Airways    Name:   Placement date:   Placement time:   Site:   Days:   Incision - 4 Ports Abdomen       11/04/18    1649     9          Intake/Output Last 24 hours No intake or output data in the 24 hours ending 11/13/18 1944  Labs/Imaging Results for orders placed or performed during the hospital encounter of 11/13/18 (from the past 48 hour(s))  Lipase, blood     Status: None   Collection Time: 11/13/18  4:40 PM  Result Value Ref Range   Lipase 22 11 - 51 U/L    Comment: Performed at [facility], [address] [place], [place] [address]  Comprehensive metabolic panel     Status: Abnormal   Collection Time: 11/13/18  4:40 PM  Result Value Ref Range   Sodium 138 135 - 145 mmol/L   Potassium 3.7 3.5 - 5.1 mmol/L   Chloride 103 98 - 111 mmol/L   CO2 25 22 - 32 mmol/L   Glucose, Bld 98 70 - 99 mg/dL   BUN 8 6 - 20 mg/dL   Creatinine, Ser 7.61 (L) 0.44 - 1.00 mg/dL   Calcium 9.5 8.9 - 60.7 mg/dL   Total Protein 8.6 (H) 6.5 - 8.1 g/dL   Albumin 4.2 3.5 - 5.0 g/dL   AST 371 (H) 15 - 41 U/L   ALT 401 (H) 0 - 44  U/L   Alkaline Phosphatase 235 (H) 38 - 126 U/L   Total Bilirubin 5.8 (H) 0.3 - 1.2 mg/dL   GFR calc non Af Amer >60 >60 mL/min   GFR calc Af Amer >60 >60 mL/min   Anion gap 10 5 - 15    Comment: Performed at [facility], [address] [place], [place] [address]  CBC     Status: Abnormal   Collection Time: 11/13/18  4:40 PM  Result Value Ref Range   WBC 6.5 4.0 - 10.5 K/uL   RBC 5.30 (H) 3.87 - 5.11 MIL/uL   Hemoglobin 14.7 12.0 - 15.0 g/dL   HCT 81.143.3 91.436.0 - 78.246.0 %   MCV 81.7 80.0 - 100.0 fL   MCH 27.7 26.0 - 34.0 pg   MCHC 33.9 30.0 - 36.0 g/dL   RDW 95.613.7 21.311.5 - 08.615.5 %   Platelets 289 150 - 400 K/uL   nRBC 0.0 0.0 - 0.2 %    Comment: Performed at [facility], [address] [place], [place] [address]  Urinalysis, Complete w Microscopic      Status: Abnormal   Collection Time: 11/13/18  4:40 PM  Result Value Ref Range   Color, Urine AMBER (A) YELLOW    Comment: BIOCHEMICALS MAY BE AFFECTED BY COLOR   APPearance CLEAR (A) CLEAR   Specific Gravity, Urine 1.021 1.005 - 1.030   pH 6.0 5.0 - 8.0   Glucose, UA NEGATIVE NEGATIVE mg/dL   Hgb urine dipstick NEGATIVE NEGATIVE   Bilirubin Urine MODERATE (A) NEGATIVE   Ketones, ur 20 (A) NEGATIVE mg/dL   Protein, ur 30 (A) NEGATIVE mg/dL   Nitrite NEGATIVE NEGATIVE   Leukocytes,Ua NEGATIVE NEGATIVE   RBC / HPF 0-5 0 - 5 RBC/hpf   WBC, UA 0-5 0 - 5 WBC/hpf   Bacteria, UA NONE SEEN NONE SEEN   Squamous Epithelial / LPF 6-10 0 - 5   Mucus PRESENT     Comment: Performed at [facility], [address] [place], [place] [address]  Pregnancy, urine POC     Status: None   Collection Time: 11/13/18  4:44 PM  Result Value Ref Range   Preg Test, Ur NEGATIVE NEGATIVE    Comment:        THE SENSITIVITY OF THIS METHODOLOGY IS >24 mIU/mL    [place] Abdomen Limited Ruq  Result Date: 11/13/2018 CLINICAL DATA:  Jaundice.  Status post cholecystectomy. EXAM: ULTRASOUND ABDOMEN LIMITED RIGHT UPPER QUADRANT COMPARISON:  MRCP November 04, 2018 and ultrasound November 03, 2018 FINDINGS: Gallbladder: Surgically absent. Common bile duct: Diameter: 8.3 mm Liver: No focal lesion identified. Within normal limits in parenchymal echogenicity. Portal vein is patent on color Doppler imaging with normal direction of blood flow towards the liver. IMPRESSION: 1. The patient is status post cholecystectomy in the interval. 2. The common bile duct now measures 8.3 mm. Prior to cholecystectomy less than 10 days ago, the common bile duct measured 3 mm. Given the history of jaundice, findings are concerning for biliary obstruction, possibly choledocholithiasis. Recommend an MRCP for better evaluation. Electronically Signed   By: Gerome Samavid  Williams III M.D   On: 11/13/2018 18:21    Pending Labs Unresulted Labs (From admission,  onward)   None      Vitals/Pain Today's Vitals   11/13/18 1637 11/13/18 1638 11/13/18 1900 11/13/18 1930  BP: 118/79  121/77 129/80  Pulse: 100   64  Resp: 18     Temp: 98.4 F (36.9 C)  TempSrc: Oral     SpO2: 97%   97%  Weight:  94.9 kg    Height:  5\' 3"  (1.6 m)    PainSc: 0-No pain       Isolation Precautions No active isolations  Medications Medications  ibuprofen (ADVIL,MOTRIN) tablet 400 mg (400 mg Oral Given 11/13/18 1846)    Mobility walks Low fall risk   Focused Assessments    R Recommendations: See Admitting Provider Note  Report given to:  Selena Batten, RN

## 2018-11-13 NOTE — H&P (Signed)
Subjective:   CC: jaundice  HPI:  Terri [name] is a 23 y.o. female who is consulted by Cyril Loosen for evaluation of above cc.  Symptoms were first noted a few days ago.  Patient underwent lap chole 9 days ago and was discharged without any issues.  Had uneventful recovery 2 days following discharge, but then started having episodic nausea and decreased appetite.  2 days prior to this admission patient then started noticing some yellowing of the skin and eyes as well.  Called the nursing line yesterday but decided to not come in until today even though she was advised to be seen in the emergency department at that time.  Currently the patient remains without any pain but complains of minor nausea.  Past Medical History: none reported  Past Surgical History:  has a past surgical history that includes Dilation and evacuation (N/A, 07/13/2017) and Cholecystectomy (N/A, 11/04/2018).  Family History: reviewed and not relevant to CC  Social History:  reports that she has been smoking cigarettes. She has been smoking about 0.50 packs per day. She has never used smokeless tobacco. She reports that she does not drink alcohol or use drugs.  Current Medications: includes amoxicillin  Allergies:  Allergies as of 11/13/2018 - Review Complete 11/13/2018  Allergen Reaction Noted  . Sulfa antibiotics Other (See Comments) 11/04/2018  . Zithromax [azithromycin] Other (See Comments) 11/30/2015    ROS:  General: Denies weight loss, weight gain, fatigue, fevers, chills, and night sweats. Eyes: Denies blurry vision, double vision, eye pain, itchy eyes, and tearing. Ears: Denies hearing loss, earache, and ringing in ears. Nose: Denies sinus pain, congestion, infections, runny nose, and nosebleeds. Mouth/throat: Denies hoarseness, sore throat, bleeding gums, and difficulty swallowing. Heart: Denies chest pain, palpitations, racing heart, irregular heartbeat, leg pain or swelling, and decreased activity  tolerance. Respiratory: Denies breathing difficulty, shortness of breath, wheezing, cough, and sputum. GI: Denies change in appetite, heartburn, constipation, diarrhea, and blood in stool. GU: Denies difficulty urinating, pain with urinating, urgency, frequency, blood in urine. Musculoskeletal: Denies joint stiffness, pain, swelling, muscle weakness. Skin: Denies rash, itching, mass, tumors, sores, and boils Neurologic: Denies headache, fainting, dizziness, seizures, numbness, and tingling. Psychiatric: Denies depression, anxiety, difficulty sleeping, and memory loss. Endocrine: Denies heat or cold intolerance, and increased thirst or urination. Blood/lymph: Denies easy bruising, easy bruising, and swollen glands     Objective:     BP 129/80   Pulse 64   Temp 98.4 F (36.9 C) (Oral)   Resp 18   Ht 5\' 3"  (1.6 m)   Wt 94.9 kg   LMP 10/17/2018 (Exact Date)   SpO2 97%   BMI 37.08 kg/m    Constitutional :  alert, cooperative, appears stated age, icteric and moderately obese  Lymphatics/Throat:  no asymmetry, masses, or scars  Respiratory:  clear to auscultation bilaterally  Cardiovascular:  regular rate and rhythm  Gastrointestinal: soft, no guarding, minimal TTP in epigastric region.   Musculoskeletal: Steady gait and movement  Skin: Cool and moist  Psychiatric: Normal affect, non-agitated, not confused       LABS:  CMP Latest Ref Rng & Units 11/13/2018 11/04/2018 11/03/2018  Glucose 70 - 99 mg/dL 98 86 161(W)  BUN 6 - 20 mg/dL 8 12 11   Creatinine 0.44 - 1.00 mg/dL 9.60(A) 5.40 9.81  Sodium 135 - 145 mmol/L 138 139 139  Potassium 3.5 - 5.1 mmol/L 3.7 3.7 3.7  Chloride 98 - 111 mmol/L 103 107 106  CO2 22 - 32 mmol/L  25 24 23   Calcium 8.9 - 10.3 mg/dL 9.5 8.9 9.6  Total Protein 6.5 - 8.1 g/dL 9.3(O) 7.3 6.7(T)  Total Bilirubin 0.3 - 1.2 mg/dL 2.4(P) 0.7 0.8  Alkaline Phos 38 - 126 U/L 235(H) 200(H) 223(H)  AST 15 - 41 U/L 244(H) 142(H) 163(H)  ALT 0 - 44 U/L 401(H) 218(H)  259(H)   CBC Latest Ref Rng & Units 11/13/2018 11/04/2018 11/03/2018  WBC 4.0 - 10.5 K/uL 6.5 6.0 6.3  Hemoglobin 12.0 - 15.0 g/dL 80.9 98.3 38.2  Hematocrit 36.0 - 46.0 % 43.3 37.2 42.5  Platelets 150 - 400 K/uL 289 150 201     RADS: n/a Assessment:      Jaundice, elevated bilirubin, s/p lap chole, likely choledocolithiasis, but not septic at this time.  Plan:     ED provider discussed case with GI, Dr. Allegra Lai, stated ERCP capabilities will be available this week in the hospital.  The suspicions for choledocholithiasis high enough based on history and physical as well as current labs to proceed directly to ERCP General surgery team was asked to be the admitting team.    We will keep patient n.p.o., IV fluids, switch over to IV antibiotic.  Repeat labs in the morning and continue to monitor until ERCP is able to be performed.

## 2018-11-13 NOTE — ED Notes (Signed)
Patient transported to Ultrasound 

## 2018-11-13 NOTE — ED Triage Notes (Addendum)
Pt c/o itching and nausea for 2 days. Pt feels her eyes look yellow as well. Had gallbladder out last week.  Called nurse line yesterday and they told her to come yesterday but she needed one more night of sleep so decided to wait until today.  No pain right now.  No fevers. Reports had MRCP and was told there were no stones stuck in duct when took gallbladder out. Sclera appear to have yellow tint to them.

## 2018-11-14 ENCOUNTER — Encounter
Admission: EM | Disposition: A | Discharge status: Home / Self Care | Payer: Self-pay | Source: Home / Self Care | Attending: Surgery

## 2018-11-14 ENCOUNTER — Encounter: Payer: Self-pay | Admitting: Gastroenterology

## 2018-11-14 ENCOUNTER — Inpatient Hospital Stay: Payer: Medicaid Other | Admitting: Certified Registered"

## 2018-11-14 ENCOUNTER — Inpatient Hospital Stay: Payer: Medicaid Other

## 2018-11-14 DIAGNOSIS — R17 Unspecified jaundice: Secondary | ICD-10-CM

## 2018-11-14 DIAGNOSIS — K8050 Calculus of bile duct without cholangitis or cholecystitis without obstruction: Secondary | ICD-10-CM

## 2018-11-14 HISTORY — PX: ERCP: SHX5425

## 2018-11-14 LAB — PHOSPHORUS: Phosphorus: 2.7 mg/dL (ref 2.5–4.6)

## 2018-11-14 LAB — BASIC METABOLIC PANEL
Anion gap: 7 (ref 5–15)
BUN: 10 mg/dL (ref 6–20)
CALCIUM: 8.7 mg/dL — AB (ref 8.9–10.3)
CO2: 24 mmol/L (ref 22–32)
Chloride: 106 mmol/L (ref 98–111)
Creatinine, Ser: 0.41 mg/dL — ABNORMAL LOW (ref 0.44–1.00)
GFR calc Af Amer: >60 mL/min (ref >60)
GFR calc non Af Amer: >60 mL/min (ref >60)
Glucose, Bld: 126 mg/dL — ABNORMAL HIGH (ref 70–99)
Potassium: 3.3 mmol/L — ABNORMAL LOW (ref 3.5–5.1)
Sodium: 137 mmol/L (ref 135–145)

## 2018-11-14 LAB — CBC
HCT: 36.5 % (ref 36.0–46.0)
Hemoglobin: 12.5 g/dL (ref 12.0–15.0)
MCH: 28 pg (ref 26.0–34.0)
MCHC: 34.2 g/dL (ref 30.0–36.0)
MCV: 81.7 fL (ref 80.0–100.0)
Platelets: 218 10*3/uL (ref 150–400)
RBC: 4.47 MIL/uL (ref 3.87–5.11)
RDW: 13.6 % (ref 11.5–15.5)
WBC: 7.1 10*3/uL (ref 4.0–10.5)
nRBC: 0 % (ref 0.0–0.2)

## 2018-11-14 LAB — HEPATIC FUNCTION PANEL
ALT: 276 U/L — ABNORMAL HIGH (ref 0–44)
AST: 146 U/L — ABNORMAL HIGH (ref 15–41)
Albumin: 3.6 g/dL (ref 3.5–5.0)
Alkaline Phosphatase: 180 U/L — ABNORMAL HIGH (ref 38–126)
Bilirubin, Direct: 2.9 mg/dL — ABNORMAL HIGH (ref 0.0–0.2)
Indirect Bilirubin: 1.1 mg/dL — ABNORMAL HIGH (ref 0.3–0.9)
Total Bilirubin: 4 mg/dL — ABNORMAL HIGH (ref 0.3–1.2)
Total Protein: 7 g/dL (ref 6.5–8.1)

## 2018-11-14 LAB — MAGNESIUM: Magnesium: 1.8 mg/dL (ref 1.7–2.4)

## 2018-11-14 SURGERY — ERCP, WITH INTERVENTION IF INDICATED
Anesthesia: General

## 2018-11-14 MED ORDER — SUGAMMADEX SODIUM 500 MG/5ML IV SOLN
INTRAVENOUS | Status: DC | PRN
  Administered 2018-11-14: 370 mg via INTRAVENOUS

## 2018-11-14 MED ORDER — ONDANSETRON HCL 4 MG/2ML IJ SOLN
INTRAMUSCULAR | Status: AC
  Filled 2018-11-14: qty 2

## 2018-11-14 MED ORDER — INDOMETHACIN 50 MG RE SUPP: 100.0000 mg | Freq: Once | RECTAL | Status: DC

## 2018-11-14 MED ORDER — FENTANYL CITRATE (PF) 100 MCG/2ML IJ SOLN
25.0000 ug | INTRAMUSCULAR | Status: AC | PRN
  Administered 2018-11-14 (×6): 25 ug via INTRAVENOUS

## 2018-11-14 MED ORDER — HYDROMORPHONE HCL 1 MG/ML IJ SOLN
INTRAMUSCULAR | Status: AC
  Administered 2018-11-14: 0.25 mg via INTRAVENOUS
  Filled 2018-11-14: qty 1

## 2018-11-14 MED ORDER — INDOMETHACIN 50 MG RE SUPP
RECTAL | Status: DC | PRN
  Administered 2018-11-14: 100 mg via RECTAL

## 2018-11-14 MED ORDER — ROCURONIUM BROMIDE 100 MG/10ML IV SOLN
INTRAVENOUS | Status: DC | PRN
  Administered 2018-11-14: 30 mg via INTRAVENOUS

## 2018-11-14 MED ORDER — FENTANYL CITRATE (PF) 100 MCG/2ML IJ SOLN
INTRAMUSCULAR | Status: AC
  Administered 2018-11-14: 25 ug via INTRAVENOUS
  Filled 2018-11-14: qty 2

## 2018-11-14 MED ORDER — ESMOLOL HCL 100 MG/10ML IV SOLN
INTRAVENOUS | Status: DC | PRN
  Administered 2018-11-14 (×3): 10 mg via INTRAVENOUS

## 2018-11-14 MED ORDER — FENTANYL CITRATE (PF) 100 MCG/2ML IJ SOLN: 25.0000 ug | INTRAMUSCULAR | Status: DC | PRN

## 2018-11-14 MED ORDER — SUCCINYLCHOLINE CHLORIDE 20 MG/ML IJ SOLN
INTRAMUSCULAR | Status: DC | PRN
  Administered 2018-11-14: 100 mg via INTRAVENOUS

## 2018-11-14 MED ORDER — INDOMETHACIN 50 MG RE SUPP
RECTAL | Status: AC
  Filled 2018-11-14: qty 2

## 2018-11-14 MED ORDER — HYDROMORPHONE HCL 1 MG/ML IJ SOLN
INTRAMUSCULAR | Status: AC
  Filled 2018-11-14: qty 1

## 2018-11-14 MED ORDER — MIDAZOLAM HCL 2 MG/2ML IJ SOLN
INTRAMUSCULAR | Status: AC
  Filled 2018-11-14: qty 2

## 2018-11-14 MED ORDER — HYDROMORPHONE HCL 1 MG/ML IJ SOLN
0.2500 mg | INTRAMUSCULAR | Status: DC | PRN
  Administered 2018-11-14 (×6): 0.25 mg via INTRAVENOUS

## 2018-11-14 MED ORDER — LIDOCAINE HCL (CARDIAC) PF 100 MG/5ML IV SOSY
PREFILLED_SYRINGE | INTRAVENOUS | Status: DC | PRN
  Administered 2018-11-14: 60 mg via INTRAVENOUS

## 2018-11-14 MED ORDER — PROPOFOL 10 MG/ML IV BOLUS
INTRAVENOUS | Status: DC | PRN
  Administered 2018-11-14: 130 mg via INTRAVENOUS

## 2018-11-14 MED ORDER — MIDAZOLAM HCL 2 MG/2ML IJ SOLN
INTRAMUSCULAR | Status: DC | PRN
  Administered 2018-11-14: 2 mg via INTRAVENOUS

## 2018-11-14 MED ORDER — PHENYLEPHRINE HCL 10 MG/ML IJ SOLN: INTRAMUSCULAR | Status: DC | PRN

## 2018-11-14 MED ORDER — ONDANSETRON HCL 4 MG/2ML IJ SOLN
4.0000 mg | Freq: Once | INTRAMUSCULAR | Status: AC | PRN
  Administered 2018-11-14: 4 mg via INTRAVENOUS

## 2018-11-14 MED ORDER — POTASSIUM CHLORIDE CRYS ER 20 MEQ PO TBCR
40.0000 meq | EXTENDED_RELEASE_TABLET | Freq: Once | ORAL | Status: AC
  Administered 2018-11-14: 40 meq via ORAL
  Filled 2018-11-14: qty 2

## 2018-11-14 MED ORDER — PROMETHAZINE HCL 25 MG/ML IJ SOLN: 6.2500 mg | INTRAMUSCULAR | Status: DC | PRN

## 2018-11-14 MED ORDER — DEXAMETHASONE SODIUM PHOSPHATE 10 MG/ML IJ SOLN
INTRAMUSCULAR | Status: DC | PRN
  Administered 2018-11-14: 10 mg via INTRAVENOUS

## 2018-11-14 NOTE — Transfer of Care (Signed)
Immediate Anesthesia Transfer of Care Note  Patient: Terri [name]  Procedure(s) Performed: ENDOSCOPIC RETROGRADE CHOLANGIOPANCREATOGRAPHY (ERCP) (N/A )  Patient Location: PACU  Anesthesia Type:General  Level of Consciousness: awake, alert , oriented and patient cooperative  Airway & Oxygen Therapy: Patient Spontanous Breathing  Post-op Assessment: Report given to RN and Post -op Vital signs reviewed and stable  Post vital signs: Reviewed and stable  Last Vitals:  Vitals Value Taken Time  BP 128/84 11/14/2018  2:03 PM  Temp    Pulse 85 11/14/2018  2:09 PM  Resp 23 11/14/2018  2:09 PM  SpO2 100 % 11/14/2018  2:09 PM  Vitals shown include unvalidated device data.  Last Pain:  Vitals:   11/14/18 1141  TempSrc: Oral  PainSc:          Complications: No apparent anesthesia complications

## 2018-11-14 NOTE — Anesthesia Post-op Follow-up Note (Signed)
Anesthesia QCDR form completed.        

## 2018-11-14 NOTE — Op Note (Signed)
[facility] Patient Name: Terri [name] Procedure Date: 11/14/2018 12:46 PM MRN: [id number] Account #: [id number] Date of Birth: 11/05/1995 Admit Type: Inpatient Age: 23 Room: [facility] ENDO ROOM 4 Gender: Female Note Status: Finalized Procedure:            ERCP Indications:          Common bile duct stone(s), Jaundice, Elevated liver                        enzymes Providers:            Midge Minium MD, MD Referring MD:         [facility] ***Heber [place], MD (Referring                        MD) Medicines:            General Anesthesia Complications:        No immediate complications. Procedure:            Pre-Anesthesia Assessment:                       - Prior to the procedure, a History and Physical was                        performed, and patient medications and allergies were                        reviewed. The patient's tolerance of previous                        anesthesia was also reviewed. The risks and benefits of                        the procedure and the sedation options and risks were                        discussed with the patient. All questions were                        answered, and informed consent was obtained. Prior                        Anticoagulants: The patient has taken no previous                        anticoagulant or antiplatelet agents. ASA Grade                        Assessment: II - A patient with mild systemic disease.                        After reviewing the risks and benefits, the patient was                        deemed in satisfactory condition to undergo the                        procedure.  After obtaining informed consent, the scope was passed                        under direct vision. Throughout the procedure, the                        patient's blood pressure, pulse, and oxygen saturations                        were monitored continuously. The Duodenoscope  was                        introduced through the mouth, and used to inject                        contrast into and used to inject contrast into the bile                        duct. The ERCP was accomplished without difficulty. The                        patient tolerated the procedure well. Findings:      A scout film of the abdomen was obtained. Surgical clips, consistent       with previous cholecystectomy, were seen in the area of the. The       esophagus was successfully intubated under direct vision. The scope was       advanced to a normal major papilla in the descending duodenum without       detailed examination of the pharynx, larynx and associated structures,       and upper GI tract. The upper GI tract was grossly normal. The bile duct       was deeply cannulated with the short-nosed traction sphincterotome.       Contrast was injected. I personally interpreted the bile duct images.       There was brisk flow of contrast through the ducts. Image quality was       excellent. Contrast extended to the entire biliary tree. The lower third       of the main bile duct contained one stone. A wire was passed into the       biliary tree. An 8 mm biliary sphincterotomy was made with a traction       (standard) sphincterotome using ERBE electrocautery. There was no       post-sphincterotomy bleeding. The biliary tree was swept with a 15 mm       balloon starting at the bifurcation. One stone was removed. No stones       remained. Impression:           - Choledocholithiasis was found. Complete removal was                        accomplished by biliary sphincterotomy and balloon                        extraction.                       - A biliary sphincterotomy was performed.                       -  The biliary tree was swept. Recommendation:       - Return patient to hospital ward for ongoing care.                       - Continue present medications.                       - Watch for  pancreatitis, bleeding, perforation, and                        cholangitis. Procedure Code(s):    --- Professional ---                       [phone], Endoscopic retrograde cholangiopancreatography                        (ERCP); with removal of calculi/debris from                        biliary/pancreatic duct(s)                       43262, Endoscopic retrograde cholangiopancreatography                        (ERCP); with sphincterotomy/papillotomy                       [phone] [id number], Endoscopic catheterization of the biliary ductal                        system, radiological supervision and interpretation Diagnosis Code(s):    --- Professional ---                       R74.8, Abnormal levels of other serum enzymes                       R17, Unspecified jaundice                       K80.50, Calculus of bile duct without cholangitis or                        cholecystitis without obstruction CPT copyright 2018 American [employer] Association. All rights reserved. The codes documented in this report are preliminary and upon coder review may  be revised to meet current compliance requirements. Midge Miniumarren Janmichael Giraud MD, MD 11/14/2018 1:48:17 PM This report has been signed electronically. Number of Addenda: 0 Note Initiated On: 11/14/2018 12:46 PM      [facility]

## 2018-11-14 NOTE — Progress Notes (Signed)
[facility] SUR[facility]ICAL PROGRESS NOTE  Hospital Day(s): 1.   Post op day(s): Day of Surgery.   Interval History: Patient seen and examined, no acute events or new complaints overnight. Patient reports improvement in her itching but still endorse some nausea and RUQ burning abdominal pain which radiates to her back. No reports of fever or chills. She has been NPO since admission.   Review of Systems:  Constitutional: denies fever, chills  Gastrointestinal: + abdominal pain, + Nausea, denied Vomiting, or diarrhea/and bowel function as per interval history Integumentary: + jaundice, + Itching   Vital signs in last 24 hours: [min-max] current  Temp:  [97.7 F (36.5 C)-98.4 F (36.9 C)] 97.7 F (36.5 C) (03/16 0624) Pulse Rate:  [62-100] 62 (03/16 0624) Resp:  [16-18] 16 (03/16 0624) BP: (106-136)/(72-93) 106/72 (03/16 0624) SpO2:  [97 %-98 %] 98 % (03/16 0624) Weight:  [94.9 kg] 94.9 kg (03/15 1638)     Height: 5\' 3"  (160 cm) Weight: 94.9 kg BMI (Calculated): 37.09   Intake/Output this shift:  No intake/output data recorded.     Physical Exam:  Constitutional: alert, cooperative and no distress  HENT: Improved scleral jaundice Respiratory: breathing non-labored at rest  Cardiovascular: regular rate and sinus rhythm  Gastrointestinal: Obese, soft, mild RUQ tenderness, and non-distended   Labs:  CBC Latest Ref Rng & Units 11/14/2018 11/13/2018 11/04/2018  WBC 4.0 - 10.5 K/uL 7.1 6.5 6.0  Hemoglobin 12.0 - 15.0 g/dL 81.0 17.5 10.2  Hematocrit 36.0 - 46.0 % 36.5 43.3 37.2  Platelets 150 - 400 K/uL 218 289 150   CMP Latest Ref Rng & Units 11/14/2018 11/13/2018 11/04/2018  Glucose 70 - 99 mg/dL 585(I) 98 86  BUN 6 - 20 mg/dL 10 8 12   Creatinine 0.44 - 1.00 mg/dL 7.78(E) 4.23(N) 3.61  Sodium 135 - 145 mmol/L 137 138 139  Potassium 3.5 - 5.1 mmol/L 3.3(L) 3.7 3.7  Chloride 98 - 111 mmol/L 106 103 107  CO2 22 - 32 mmol/L 24 25 24   Calcium 8.9 - 10.3 mg/dL 4.4(R) 9.5 8.9   Total Protein 6.5 - 8.1 g/dL 7.0 1.5(Q) 7.3  Total Bilirubin 0.3 - 1.2 mg/dL 4.0(H) 5.8(H) 0.7  Alkaline Phos 38 - 126 U/L 180(H) 235(H) 200(H)  AST 15 - 41 U/L 146(H) 244(H) 142(H)  ALT 0 - 44 U/L 276(H) 401(H) 218(H)     Imaging studies: No new pertinent imaging studies   Assessment/Plan:  23 y.o. female with jaundice and nausea 9 days s/p laparoscopic cholecystectomy for acute cholecysitits, complicated by pertinent comorbidities including obesity and current tobacco abuse (smoking).   - NPO, IVF, IV ABx (Rocephin)   - Plan for ERCP with Dr Servando Snare, MD this afternoon, monitor for post-ERCP pancreatitis  - Monitor abdominal examination, jaundice  - Follow up LFTs/Bilirubin  - Medical management of comorbidities   All of the above findings and recommendations were discussed with the patient, and the medical team, and all of patient's questions were answered to her expressed satisfaction.  -- Lynden Oxford, PA-C [facility] 11/14/2018, 8:53 AM [phone] M-F: 7am - 4pm

## 2018-11-14 NOTE — Anesthesia Procedure Notes (Signed)
Procedure Name: Intubation Date/Time: 11/14/2018 1:13 PM Performed by: Mohammed Kindle, CRNA Pre-anesthesia Checklist: Patient identified, Emergency Drugs available, Suction available and Patient being monitored Patient Re-evaluated:Patient Re-evaluated prior to induction Oxygen Delivery Method: Circle system utilized Preoxygenation: Pre-oxygenation with 100% oxygen Induction Type: IV induction and Rapid sequence Laryngoscope Size: McGraph and 3 Grade View: Grade I Tube type: Oral Tube size: 7.0 mm Number of attempts: 1 Airway Equipment and Method: Stylet Placement Confirmation: CO2 detector,  positive ETCO2 and ETT inserted through vocal cords under direct vision Secured at: 21 cm Tube secured with: Tape Dental Injury: Teeth and Oropharynx as per pre-operative assessment

## 2018-11-14 NOTE — Anesthesia Preprocedure Evaluation (Signed)
Anesthesia Evaluation  Patient identified by MRN, date of birth, ID band Patient awake    Reviewed: Allergy & Precautions, H&P , NPO status , Patient's Chart, lab work & pertinent test results  Airway Mallampati: II  TM Distance: >3 FB     Dental  (+) Chipped, Poor Dentition   Pulmonary neg COPD, Current Smoker,           Cardiovascular (-) hypertension(-) anginanegative cardio ROS  (-) dysrhythmias      Neuro/Psych negative neurological ROS  negative psych ROS   GI/Hepatic negative GI ROS, Neg liver ROS,   Endo/Other  negative endocrine ROS  Renal/GU negative Renal ROS  negative genitourinary   Musculoskeletal   Abdominal   Peds  Hematology negative hematology ROS (+)   Anesthesia Other Findings Obese  History reviewed. No pertinent past medical history.  Past Surgical History: 11/04/2018: CHOLECYSTECTOMY; N/A     Comment:  Procedure: LAPAROSCOPIC CHOLECYSTECTOMY;  Surgeon:               Henrene Dodge, MD;  Location: [facility] ORS;  Service:               General;  Laterality: N/A; 07/13/2017: DILATION AND EVACUATION; N/A     Comment:  Procedure: DILATATION AND EVACUATION;  Surgeon: Ward,               Elenora Fender, MD;  Location: [facility] ORS;  Service: Gynecology;              Laterality: N/A;  BMI    Body Mass Index:  37.08 kg/m      Reproductive/Obstetrics negative OB ROS                             Anesthesia Physical Anesthesia Plan  ASA: III  Anesthesia Plan: General   Post-op Pain Management:    Induction: Rapid sequence  PONV Risk Score and Plan: Propofol infusion and TIVA  Airway Management Planned:   Additional Equipment:   Intra-op Plan:   Post-operative Plan:   Informed Consent: I have reviewed the patients History and Physical, chart, labs and discussed the procedure including the risks, benefits and alternatives for the proposed anesthesia with the patient or  authorized representative who has indicated his/her understanding and acceptance.     Dental Advisory Given  Plan Discussed with: Anesthesiologist and CRNA  Anesthesia Plan Comments:         Anesthesia Quick Evaluation

## 2018-11-15 LAB — LIPASE, BLOOD: Lipase: 24 U/L (ref 11–51)

## 2018-11-15 LAB — HEPATIC FUNCTION PANEL
ALBUMIN: 3.6 g/dL (ref 3.5–5.0)
ALT: 209 U/L — ABNORMAL HIGH (ref 0–44)
AST: 79 U/L — AB (ref 15–41)
Alkaline Phosphatase: 168 U/L — ABNORMAL HIGH (ref 38–126)
Bilirubin, Direct: 0.8 mg/dL — ABNORMAL HIGH (ref 0.0–0.2)
Indirect Bilirubin: 0.8 mg/dL (ref 0.3–0.9)
TOTAL PROTEIN: 6.9 g/dL (ref 6.5–8.1)
Total Bilirubin: 1.6 mg/dL — ABNORMAL HIGH (ref 0.3–1.2)

## 2018-11-15 NOTE — Discharge Summary (Signed)
[facility] SURGICAL DISCHARGE SUMMARY  Patient ID: Terri [name] MRN: [id number] DOB/AGE: April 14, 1996 23 y.o.  Admit date: 11/13/2018 Discharge date: 11/15/2018  Discharge Diagnoses Choledocholithiasis  Consultants Gastroenterology (Dr Servando Snare)  Procedures 11/14/2018:  ERCP - Dr Servando Snare  HPI: Terri [name] is a 23 y.o. female who is consulted by Cyril Loosen for evaluation of above cc.  Symptoms were first noted a few days ago.  Patient underwent lap chole 9 days ago and was discharged without any issues.  Had uneventful recovery 2 days following discharge, but then started having episodic nausea and decreased appetite.  2 days prior to this admission patient then started noticing some yellowing of the skin and eyes as well.  Called the nursing line yesterday but decided to not come in until today even though she was advised to be seen in the emergency department at that time  Hospital Course: Patient underwent uneventful ERCP (Dr Servando Snare, 11/14/2018).  Post-operatively, patient's pain/nausea/itching improved/resolved and advancement of patient's diet and ambulation were well-tolerated. The remainder of patient's hospital course was essentially unremarkable, and discharge planning was initiated accordingly with patient safely able to be discharged home with appropriate discharge instructions, antibiotics (complete current course), pain control, and outpatient follow-up after all of her questions were answered to her expressed satisfaction.   Discharge Condition: Good   Physical Examination:  Constitutional: Well appearing female, NAD HEENT: No scleral icterus Pulmonary: Normal effort, no respiratory distress Gastrointestinal: Soft, non-tender, non-distended Skin: Laparoscopic incisions are well healed   Allergies as of 11/15/2018      Reactions   Sulfa Antibiotics Other (See Comments)   Reaction: unknown.  Told allergic as child but believes she's taken since.   Zithromax [azithromycin] Other (See Comments)   Reaction: unknown Told reaction as a child      Medication List    STOP taking these medications   amoxicillin-clavulanate 875-125 MG tablet Commonly known as:  Augmentin     TAKE these medications   diphenhydrAMINE 50 MG capsule Commonly known as:  BENADRYL Take 50 mg by mouth at bedtime as needed for sleep.   ibuprofen 600 MG tablet Commonly known as:  ADVIL,MOTRIN Take 1 tablet (600 mg total) by mouth every 8 (eight) hours as needed for fever, mild pain or moderate pain.   oxyCODONE 5 MG immediate release tablet Commonly known as:  Oxy IR/ROXICODONE Take 1 tablet (5 mg total) by mouth every 4 (four) hours as needed for severe pain.        Follow-up Information    Piscoya, Elita Quick, MD. Schedule an appointment as soon as possible for a visit in 1 week(s).   Specialty:  General Surgery Why:  Patient may have an appointment already scheduled....it is okay to move that appointment to next week Contact information: [address] [place] [address] [phone]            -- Lynden Oxford , PA-C [facility]  11/15/2018, 7:53 AM [phone] M-F: 7am - 4pm

## 2018-11-15 NOTE — Progress Notes (Signed)
Discharge order received. Patient is alert and oriented. Vital signs stable . No signs of acute distress. Discharge instructions given. Patient verbalized understanding. No other issues noted at this time.   

## 2018-11-16 NOTE — Anesthesia Postprocedure Evaluation (Signed)
Anesthesia Post Note  Patient: Terri [name]  Procedure(s) Performed: ENDOSCOPIC RETROGRADE CHOLANGIOPANCREATOGRAPHY (ERCP) (N/A )  Patient location during evaluation: PACU Anesthesia Type: General Level of consciousness: awake and alert Pain management: pain level controlled Vital Signs Assessment: post-procedure vital signs reviewed and stable Respiratory status: spontaneous breathing, nonlabored ventilation and respiratory function stable Cardiovascular status: blood pressure returned to baseline and stable Postop Assessment: no apparent nausea or vomiting Anesthetic complications: no     Last Vitals:  Vitals:   11/14/18 2050 11/15/18 0450  BP: 110/67 104/79  Pulse: 67 (!) 58  Resp: 16 16  Temp: 36.9 C 36.5 C  SpO2: 95% 96%    Last Pain:  Vitals:   11/15/18 0450  TempSrc: Oral  PainSc:                  Jovita Gamma

## 2018-11-21 ENCOUNTER — Telehealth: Payer: Self-pay | Admitting: Surgery

## 2018-11-21 NOTE — Telephone Encounter (Signed)
I have called patient to discuss appointment scheduled with Dr Aleen Campi on 11/22/18. Piscoya has requested a telephone visit. No answer at patient's number or the mother's number. I have left a message on both voicemails. All numbers need to be verified and email.

## 2018-11-22 ENCOUNTER — Inpatient Hospital Stay: Payer: Self-pay | Admitting: Surgery

## 2019-01-16 ENCOUNTER — Encounter: Payer: Self-pay | Admitting: Family Medicine

## 2019-01-16 ENCOUNTER — Ambulatory Visit (INDEPENDENT_AMBULATORY_CARE_PROVIDER_SITE_OTHER): Payer: Medicaid Other | Admitting: Family Medicine

## 2019-01-16 DIAGNOSIS — F4310 Post-traumatic stress disorder, unspecified: Secondary | ICD-10-CM | POA: Insufficient documentation

## 2019-01-16 DIAGNOSIS — F411 Generalized anxiety disorder: Secondary | ICD-10-CM

## 2019-01-16 DIAGNOSIS — Z3009 Encounter for other general counseling and advice on contraception: Secondary | ICD-10-CM

## 2019-01-16 DIAGNOSIS — R17 Unspecified jaundice: Secondary | ICD-10-CM

## 2019-01-16 DIAGNOSIS — F17200 Nicotine dependence, unspecified, uncomplicated: Secondary | ICD-10-CM | POA: Diagnosis not present

## 2019-01-16 DIAGNOSIS — F322 Major depressive disorder, single episode, severe without psychotic features: Secondary | ICD-10-CM | POA: Insufficient documentation

## 2019-01-16 DIAGNOSIS — F321 Major depressive disorder, single episode, moderate: Secondary | ICD-10-CM | POA: Diagnosis not present

## 2019-01-16 MED ORDER — CITALOPRAM HYDROBROMIDE 20 MG PO TABS: 20.0000 mg | ORAL_TABLET | Freq: Every day | ORAL | 3 refills | Status: DC

## 2019-01-16 NOTE — Assessment & Plan Note (Signed)
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

## 2019-01-16 NOTE — Assessment & Plan Note (Signed)
3-5 min discussion regarding health risks of continued smoking, benefits of cessation, and options to help with cessation Due to severe uncontrolled anxiety and PTSD, not a good candidate for wellbutrin or chantix at this time Discussed patches and NRT Discussed quitline and info given
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
## 2019-01-16 NOTE — Patient Instructions (Addendum)
Someone will call you about the therapy referral Start celexa 20mg  daily (in the mornign or at bedtime)   Intrauterine Device Information An intrauterine device (IUD) is a medical device that is inserted in the uterus to prevent pregnancy. It is a small, T-shaped device that has one or two nylon strings hanging down from it. The strings hang out of the lower part of the uterus (cervix) to allow for future IUD removal. There are two types of IUDs available:  Hormone IUD. This type of IUD is made of plastic and contains the hormone progestin (synthetic progesterone). A hormone IUD may last 3-5 years.  Copper IUD. This type of IUD has copper wire wrapped around it. A copper IUD may last up to 10 years. How is an IUD inserted? An IUD is inserted through the vagina and placed into the uterus with a minor medical procedure. The exact procedure for IUD insertion may vary among health care providers and hospitals. How does an IUD work? Synthetic progesterone in a hormonal IUD prevents pregnancy by:  Thickening cervical mucus to prevent sperm from entering the uterus.  Thinning the uterine lining to prevent a fertilized egg from being implanted there. Copper in a copper IUD prevents pregnancy by making the uterus and fallopian tubes produce a fluid that kills sperm. What are the advantages of an IUD? Advantages of either type of IUD  It is highly effective in preventing pregnancy.  It is reversible. You can become pregnant shortly after the IUD is removed.  It is low-maintenance and can stay in place for a long time.  There are no estrogen-related side effects.  It can be used when breastfeeding.  It is not associated with weight gain.  It can be inserted right after childbirth, an abortion, or a miscarriage. Advantages of a hormone IUD  If it is inserted within 7 days of your period starting, it works right after it is inserted. If the hormone IUD is inserted at any other time in your  cycle, you will need to use a backup method of birth control for 7 days after insertion.  It can make menstrual periods lighter.  It can reduce menstrual cramping.  It can be used for 3-5 years. Advantages of a copper IUD  It works right after it is inserted.  It can be used as a form of emergency birth control if it is inserted within 5 days after having unprotected sex.  It does not interfere with your body's natural hormones.  It can be used for 10 years. What are the disadvantages of an IUD?  An IUD may cause irregular menstrual bleeding for a period of time after insertion.  You may have pain during insertion and have cramping and vaginal bleeding after insertion.  An IUD may cut the uterus (uterine perforation) when it is inserted. This is rare.  An IUD may cause pelvic inflammatory disease (PID), which is an infection in the uterus and fallopian tubes. This is rare, and it usually happens during the first 20 days after the IUD is inserted.  A copper IUD can make your menstrual flow heavier and more painful. How is an IUD removed?  You will lie on your back with your knees bent and your feet in footrests (stirrups).  A device will be inserted into your vagina to spread apart the vaginal walls (speculum). This will allow your health care provider to see the strings attached to the IUD.  Your health care provider will use a small  instrument (forceps) to grasp the IUD strings and pull firmly until the IUD is removed. You may have some discomfort when the IUD is removed. Your health care provider may recommend taking over-the-counter pain relievers, such as ibuprofen, before the procedure. You may also have minor spotting for a few days after the procedure. The exact procedure for IUD removal may vary among health care providers and hospitals. Is the IUD right for me? Your health care provider will make sure you are a good candidate for an IUD and will discuss the advantages,  disadvantages, and possible side effects with you. Summary  An intrauterine device (IUD) is a medical device that is inserted in the uterus to prevent pregnancy. It is a small, T-shaped device that has one or two nylon strings hanging down from it.  A hormone IUD contains the hormone progestin (synthetic progesterone). A copper IUD has copper wire wrapped around it.  Synthetic progesterone in a hormone IUD prevents pregnancy by thickening cervical mucus and thinning the walls of the uterus. Copper in a copper IUD prevents pregnancy by making the uterus and fallopian tubes produce a fluid that kills sperm.  A hormone IUD can be left in place for 3-5 years. A copper IUD can be left in place for up to 10 years.  An IUD is inserted and removed by a health care provider. You may feel some pain during insertion and removal. Your health care provider may recommend taking over-the-counter pain medicine, such as ibuprofen, before an IUD procedure. This information is not intended to replace advice given to you by your health care provider. Make sure you discuss any questions you have with your health care provider. Document Released: 07/21/2004 Document Revised: 09/15/2016 Document Reviewed: 09/15/2016 Elsevier Interactive Patient Education  2019 Elsevier Inc.   1-800-QUITNOW Steps to Quit Smoking  Smoking tobacco can be harmful to your health and can affect almost every organ in your body. Smoking puts you, and those around you, at risk for developing many serious chronic diseases. Quitting smoking is difficult, but it is one of the best things that you can do for your health. It is never too late to quit. What are the benefits of quitting smoking? When you quit smoking, you lower your risk of developing serious diseases and conditions, such as:  Lung cancer or lung disease, such as COPD.  Heart disease.  Stroke.  Heart attack.  Infertility.  Osteoporosis and bone fractures. Additionally,  symptoms such as coughing, wheezing, and shortness of breath may get better when you quit. You may also find that you get sick less often because your body is stronger at fighting off colds and infections. If you are pregnant, quitting smoking can help to reduce your chances of having a baby of low birth weight. How do I get ready to quit? When you decide to quit smoking, create a plan to make sure that you are successful. Before you quit:  Pick a date to quit. Set a date within the next two weeks to give you time to prepare.  Write down the reasons why you are quitting. Keep this list in places where you will see it often, such as on your bathroom mirror or in your car or wallet.  Identify the people, places, things, and activities that make you want to smoke (triggers) and avoid them. Make sure to take these actions: ? Throw away all cigarettes at home, at work, and in your car. ? Throw away smoking accessories, such as ashtrays  and lighters. ? Clean your car and make sure to empty the ashtray. ? Clean your home, including curtains and carpets.  Tell your family, friends, and coworkers that you are quitting. Support from your loved ones can make quitting easier.  Talk with your health care provider about your options for quitting smoking.  Find out what treatment options are covered by your health insurance. What strategies can I use to quit smoking? Talk with your healthcare provider about different strategies to quit smoking. Some strategies include:  Quitting smoking altogether instead of gradually lessening how much you smoke over a period of time. Research shows that quitting "cold [place]" is more successful than gradually quitting.  Attending in-person counseling to help you build problem-solving skills. You are more likely to have success in quitting if you attend several counseling sessions. Even short sessions of 10 minutes can be effective.  Finding resources and support systems  that can help you to quit smoking and remain smoke-free after you quit. These resources are most helpful when you use them often. They can include: ? Online chats with a Veterinary surgeon. ? Telephone quitlines. ? Automotive engineer. ? Support groups or group counseling. ? Text messaging programs. ? Mobile phone applications.  Taking medicines to help you quit smoking. (If you are pregnant or breastfeeding, talk with your health care provider first.) Some medicines contain nicotine and some do not. Both types of medicines help with cravings, but the medicines that include nicotine help to relieve withdrawal symptoms. Your health care provider may recommend: ? Nicotine patches, gum, or lozenges. ? Nicotine inhalers or sprays. ? Non-nicotine medicine that is taken by mouth. Talk with your health care provider about combining strategies, such as taking medicines while you are also receiving in-person counseling. Using these two strategies together makes you more likely to succeed in quitting than if you used either strategy on its own. If you are pregnant or breastfeeding, talk with your health care provider about finding counseling or other support strategies to quit smoking. Do not take medicine to help you quit smoking unless told to do so by your health care provider. What things can I do to make it easier to quit? Quitting smoking might feel overwhelming at first, but there is a lot that you can do to make it easier. Take these important actions:  Reach out to your family and friends and ask that they support and encourage you during this time. Call telephone quitlines, reach out to support groups, or work with a counselor for support.  Ask people who smoke to avoid smoking around you.  Avoid places that trigger you to smoke, such as bars, parties, or smoke-break areas at work.  Spend time around people who do not smoke.  Lessen stress in your life, because stress can be a smoking trigger  for some people. To lessen stress, try: ? Exercising regularly. ? Deep-breathing exercises. ? Yoga. ? Meditating. ? Performing a body scan. This involves closing your eyes, scanning your body from head to toe, and noticing which parts of your body are particularly tense. Purposefully relax the muscles in those areas.  Download or purchase mobile phone or tablet apps (applications) that can help you stick to your quit plan by providing reminders, tips, and encouragement. There are many free apps, such as QuitGuide from the [employer] developer for Disease Control and Prevention). You can find other support for quitting smoking (smoking cessation) through smokefree.gov and other websites. How will I feel when I quit  smoking? Within the first 24 hours of quitting smoking, you may start to feel some withdrawal symptoms. These symptoms are usually most noticeable 2-3 days after quitting, but they usually do not last beyond 2-3 weeks. Changes or symptoms that you might experience include:  Mood swings.  Restlessness, anxiety, or irritation.  Difficulty concentrating.  Dizziness.  Strong cravings for sugary foods in addition to nicotine.  Mild weight gain.  Constipation.  Nausea.  Coughing or a sore throat.  Changes in how your medicines work in your body.  A depressed mood.  Difficulty sleeping (insomnia). After the first 2-3 weeks of quitting, you may start to notice more positive results, such as:  Improved sense of smell and taste.  Decreased coughing and sore throat.  Slower heart rate.  Lower blood pressure.  Clearer skin.  The ability to breathe more easily.  Fewer sick days. Quitting smoking is very challenging for most people. Do not get discouraged if you are not successful the first time. Some people need to make many attempts to quit before they achieve long-term success. Do your best to stick to your quit plan, and talk with your health care provider if you have any  questions or concerns. This information is not intended to replace advice given to you by your health care provider. Make sure you discuss any questions you have with your health care provider. Document Released: 08/11/2001 Document Revised: 03/23/2017 Document Reviewed: 01/01/2015 Elsevier Interactive Patient Education  2019 [employer]

## 2019-01-16 NOTE — Assessment & Plan Note (Signed)
New problem Start celexa as above Contracted for safety - no SI/HI Therapy as above F/u in 6-8 weeks and repeat PHQ9 and GAD7 screenings

## 2019-01-16 NOTE — Assessment & Plan Note (Signed)
Patient is sexually active and does not desire pregnancy at this time Discussed options including OCPs, POPs, patch, ring, Depo, IUD, Nexplanon, condoms/barriers Patient is interested in IUD Will give her more information and set up a visit for further discussion and possible insertion Patient will be due for pap smear and STD screenings at time of IUD insertion as well

## 2019-01-16 NOTE — Assessment & Plan Note (Signed)
Previously with cholestatic jaundice related to cholelithiasis/choledocolithiasis Now s/p cholescystectomy and ERCP and asymptomatic Discussed that we will repeat LFTs at next visit to ensure hyperbilirubinemia has resolved

## 2019-01-16 NOTE — Assessment & Plan Note (Signed)
Patient with nightmares and flashbacks related to a childhood trauma Will start treatment with Celexa as above Discussed importance of therapy with CBT Return precautions discussed

## 2019-01-17 ENCOUNTER — Ambulatory Visit: Payer: Self-pay | Admitting: Family Medicine

## 2019-02-01 ENCOUNTER — Ambulatory Visit: Payer: Self-pay | Admitting: Family Medicine

## 2019-02-20 ENCOUNTER — Telehealth: Payer: Self-pay

## 2019-02-20 NOTE — Telephone Encounter (Signed)
She can start AZO, push fluids and do an evisit tomorrow. We really need a urine from here, though, since she does not have any recent urine cultures nor does she have history of recurrent UTI. She could schedule an evisit for tomorrow but come by sometime between now and then to give [place] a urine sample to test for her.

## 2019-02-20 NOTE — Telephone Encounter (Signed)
Patient called office requesting for a doxy visit today , but there were no more available time slots. Patient states that she has had dysuria and frequency for the past two days and wanted to know if a antibiotic could be called into [facility] [address]. Patient reports symptoms of dysuria and  Frequency. Patient denies  hematuria, fever nausea or back pain. KW

## 2019-02-20 NOTE — Telephone Encounter (Signed)
Patient advised as below. Patient verbalizes understanding and is in agreement with treatment plan.  

## 2019-02-21 ENCOUNTER — Ambulatory Visit: Payer: Self-pay | Admitting: Physician Assistant

## 2019-02-21 NOTE — Progress Notes (Deleted)
       Patient: Terri [name] Female    DOB: 1995-10-29   23 y.o.   MRN: [id number] Visit Date: 02/21/2019  Today's Provider: Mar Daring, PA-C   No chief complaint on file.  Subjective:     HPI  Allergies  Allergen Reactions  . Sulfa Antibiotics Other (See Comments)    Reaction: unknown.  Told allergic as child but believes she's taken since.  Marland Kitchen Zithromax [Azithromycin] Other (See Comments)    Reaction: unknown Told reaction as a child     Current Outpatient Medications:  .  citalopram (CELEXA) 20 MG tablet, Take 1 tablet (20 mg total) by mouth daily., Disp: 30 tablet, Rfl: 3 .  ibuprofen (ADVIL,MOTRIN) 600 MG tablet, Take 1 tablet (600 mg total) by mouth every 8 (eight) hours as needed for fever, mild pain or moderate pain., Disp: 30 tablet, Rfl: 0  Review of Systems  Constitutional: Negative for appetite change, chills, fatigue and fever.  Respiratory: Negative for chest tightness and shortness of breath.   Cardiovascular: Negative for chest pain and palpitations.  Gastrointestinal: Negative for abdominal pain, nausea and vomiting.  Neurological: Negative for dizziness and weakness.    Social History   Tobacco Use  . Smoking status: Current Every Day Smoker    Packs/day: 0.50    Years: 8.00    Pack years: 4.00    Types: Cigarettes  . Smokeless tobacco: Never Used  Substance Use Topics  . Alcohol use: No      Objective:   There were no vitals taken for this visit. There were no vitals filed for this visit.   Physical Exam   No results found for any visits on 02/21/19.     Assessment & [place], PA-C  [facility]

## 2019-03-02 ENCOUNTER — Encounter: Payer: Self-pay | Admitting: Family Medicine

## 2019-03-02 ENCOUNTER — Ambulatory Visit (INDEPENDENT_AMBULATORY_CARE_PROVIDER_SITE_OTHER): Payer: Medicaid Other | Admitting: Family Medicine

## 2019-03-02 DIAGNOSIS — F411 Generalized anxiety disorder: Secondary | ICD-10-CM | POA: Diagnosis not present

## 2019-03-02 DIAGNOSIS — F4310 Post-traumatic stress disorder, unspecified: Secondary | ICD-10-CM | POA: Diagnosis not present

## 2019-03-02 DIAGNOSIS — F321 Major depressive disorder, single episode, moderate: Secondary | ICD-10-CM

## 2019-03-02 MED ORDER — BUSPIRONE HCL 5 MG PO TABS: 5.0000 mg | ORAL_TABLET | Freq: Three times a day (TID) | ORAL | 3 refills | Status: DC

## 2019-03-02 MED ORDER — CITALOPRAM HYDROBROMIDE 20 MG PO TABS: 20.0000 mg | ORAL_TABLET | Freq: Every day | ORAL | 3 refills | Status: DC

## 2019-03-02 NOTE — Assessment & Plan Note (Signed)
Very slightly improved, but still not controlled She has some symptoms consistent with PTSD from her childhood trauma Discussed the importance of therapy again and she will be seeing a therapist later this month We will continue Celexa 20 mg daily Add BuSpar 5 mg 3 times daily to help specifically with anxiety Discussed possible side effects Consider dose titration at next visit Repeat PHQ 9 and GAD 7 at next visit

## 2019-03-02 NOTE — Progress Notes (Signed)
Patient: Terri [name] Female    DOB: Jan 27, 1996   23 y.o.   MRN: [id number] Visit Date: 03/02/2019  Today's Provider: Lavon Paganini, MD   Chief Complaint  Patient presents with  . Anxiety   Subjective:    Virtual Visit via Video Note  I connected with Terri [name] on 03/02/19 at  8:20 AM EDT by a video enabled telemedicine application and verified that I am speaking with the correct person using two identifiers.   Patient location: home Provider location: [place] involved in the visit: patient, provider   I discussed the limitations of evaluation and management by telemedicine and the availability of in person appointments. The patient expressed understanding and agreed to proceed.  HPI  Anxiety/ Depression Patient presents today for 6 week follow-up for anxiety and depression. Patient was started on Celexa 20 mg daily and a referral to psychology was placed.  Depression is "amazing," anxiety is still very much a problem. Worrying all the time.  Initially had some nausea, but seemed to get over the hump after ~2 wks.  Has first appt with therapist later this month.  GAD 7 : Generalized Anxiety Score 03/02/2019 01/16/2019  Nervous, Anxious, on Edge 3 3  Control/stop worrying 2 3  Worry too much - different things 3 3  Trouble relaxing 1 3  Restless 3 2  Easily annoyed or irritable 3 3  Afraid - awful might happen 2 3  Total GAD 7 Score 17 20  Anxiety Difficulty Very difficult Extremely difficult   Depression screen [facility] 2/9 03/02/2019 01/16/2019  Decreased Interest 1 3  Down, Depressed, Hopeless 0 3  PHQ - 2 Score 1 6  Altered sleeping 1 2  Tired, decreased energy 1 2  Change in appetite 0 3  Feeling bad or failure about yourself  0 3  Trouble concentrating 1 2  Moving slowly or fidgety/restless 0 0  Suicidal thoughts 0 1  PHQ-9 Score 4 19  Difficult doing work/chores Not difficult at all Very difficult       Allergies  Allergen Reactions  . Sulfa Antibiotics Other (See Comments)    Reaction: unknown.  Told allergic as child but believes she's taken since.  Marland Kitchen Zithromax [Azithromycin] Other (See Comments)    Reaction: unknown Told reaction as a child     Current Outpatient Medications:  .  citalopram (CELEXA) 20 MG tablet, Take 1 tablet (20 mg total) by mouth daily., Disp: 30 tablet, Rfl: 3 .  ibuprofen (ADVIL,MOTRIN) 600 MG tablet, Take 1 tablet (600 mg total) by mouth every 8 (eight) hours as needed for fever, mild pain or moderate pain., Disp: 30 tablet, Rfl: 0 .  busPIRone (BUSPAR) 5 MG tablet, Take 1 tablet (5 mg total) by mouth 3 (three) times daily., Disp: 90 tablet, Rfl: 3  Review of Systems  Constitutional: Negative.   Respiratory: Negative.   Genitourinary: Negative.   Hematological: Negative.     Social History   Tobacco Use  . Smoking status: Current Every Day Smoker    Packs/day: 0.50    Years: 8.00    Pack years: 4.00    Types: Cigarettes  . Smokeless tobacco: Never Used  Substance Use Topics  . Alcohol use: No      Objective:   There were no vitals taken for this visit. There were no vitals filed for this visit.   Physical Exam Constitutional:      Appearance: Normal appearance.  Pulmonary:     Effort: Pulmonary effort is normal. No respiratory distress.  Neurological:     Mental Status: She is alert and oriented to person, place, and time. Mental status is at baseline.  Psychiatric:        Attention and Perception: Attention normal.        Mood and Affect: Affect normal. Mood is anxious.        Speech: Speech normal.        Behavior: Behavior normal.        Thought Content: Thought content does not include homicidal or suicidal ideation.      No results found for any visits on 03/02/19.     Assessment & Plan    I discussed the assessment and treatment plan with the patient. The patient was provided an opportunity to ask questions and all were  answered. The patient agreed with the plan and demonstrated an understanding of the instructions.   The patient was advised to call back or seek an in-person evaluation if the symptoms worsen or if the condition fails to improve as anticipated.   Problem List Items Addressed This Visit      Other   GAD (generalized anxiety disorder) - Primary    Very slightly improved, but still not controlled She has some symptoms consistent with PTSD from her childhood trauma Discussed the importance of therapy again and she will be seeing a therapist later this month We will continue Celexa 20 mg daily Add BuSpar 5 mg 3 times daily to help specifically with anxiety Discussed possible side effects Consider dose titration at next visit Repeat PHQ 9 and GAD 7 at next visit      Relevant Medications   citalopram (CELEXA) 20 MG tablet   busPIRone (BUSPAR) 5 MG tablet   Current moderate episode of major depressive disorder without prior episode (HCC)    First episode Well-controlled Continue Celexa 20 mg daily Contracted for safety-no SI Therapy as above Repeat PHQ 9 and GAD 7 at next visit      Relevant Medications   citalopram (CELEXA) 20 MG tablet   busPIRone (BUSPAR) 5 MG tablet   PTSD (post-traumatic stress disorder)    Patient with nightmares and flashbacks related to a childhood trauma Discussed importance of therapy with CBT Add BuSpar to Celexa as above Return precautions discussed Contracted for safety      Relevant Medications   citalopram (CELEXA) 20 MG tablet   busPIRone (BUSPAR) 5 MG tablet       Return in about 6 weeks (around 04/13/2019) for MDD/GAD f/u.   The entirety of the information documented in the History of Present Illness, Review of Systems and Physical Exam were personally obtained by me. Portions of this information were initially documented by [facility] , [name] and reviewed by me for thoroughness and accuracy.    Bacigalupo, Marzella SchleinAngela M, MD MPH  [facility]

## 2019-03-02 NOTE — Assessment & Plan Note (Signed)
First episode Well-controlled Continue Celexa 20 mg daily Contracted for safety-no SI Therapy as above Repeat PHQ 9 and GAD 7 at next visit

## 2019-03-02 NOTE — Assessment & Plan Note (Signed)
Patient with nightmares and flashbacks related to a childhood trauma Discussed importance of therapy with CBT Add BuSpar to Celexa as above Return precautions discussed Contracted for safety

## 2019-03-20 ENCOUNTER — Ambulatory Visit: Payer: Medicaid Other | Admitting: Licensed Clinical Social Worker

## 2019-03-20 ENCOUNTER — Emergency Department: Payer: Medicaid Other

## 2019-03-20 ENCOUNTER — Emergency Department
Admission: EM | Admit: 2019-03-20 | Discharge: 2019-03-20 | Disposition: A | Discharge status: Home / Self Care | Payer: Medicaid Other | Attending: Emergency Medicine | Admitting: Emergency Medicine

## 2019-03-20 ENCOUNTER — Other Ambulatory Visit: Payer: Self-pay

## 2019-03-20 DIAGNOSIS — R109 Unspecified abdominal pain: Secondary | ICD-10-CM | POA: Insufficient documentation

## 2019-03-20 DIAGNOSIS — F17210 Nicotine dependence, cigarettes, uncomplicated: Secondary | ICD-10-CM | POA: Diagnosis not present

## 2019-03-20 DIAGNOSIS — R350 Frequency of micturition: Secondary | ICD-10-CM | POA: Diagnosis present

## 2019-03-20 DIAGNOSIS — N390 Urinary tract infection, site not specified: Secondary | ICD-10-CM | POA: Diagnosis not present

## 2019-03-20 DIAGNOSIS — Z79899 Other long term (current) drug therapy: Secondary | ICD-10-CM | POA: Insufficient documentation

## 2019-03-20 LAB — URINALYSIS, COMPLETE (UACMP) WITH MICROSCOPIC
Bacteria, UA: NONE SEEN
Bilirubin Urine: NEGATIVE
Glucose, UA: NEGATIVE mg/dL
Ketones, ur: NEGATIVE mg/dL
Nitrite: POSITIVE — AB
Protein, ur: 100 mg/dL — AB
RBC / HPF: >50 RBC/hpf — ABNORMAL HIGH (ref 0–5)
Specific Gravity, Urine: 1.014 (ref 1.005–1.030)
WBC, UA: >50 WBC/hpf — ABNORMAL HIGH (ref 0–5)
pH: 6 (ref 5.0–8.0)

## 2019-03-20 LAB — POCT PREGNANCY, URINE: Preg Test, Ur: NEGATIVE

## 2019-03-20 MED ORDER — KETOROLAC TROMETHAMINE 30 MG/ML IJ SOLN
30.0000 mg | Freq: Once | INTRAMUSCULAR | Status: AC
  Administered 2019-03-20: 30 mg via INTRAMUSCULAR
  Filled 2019-03-20: qty 1

## 2019-03-20 MED ORDER — CEPHALEXIN 500 MG PO CAPS: 500.0000 mg | ORAL_CAPSULE | Freq: Two times a day (BID) | ORAL | 0 refills | Status: DC

## 2019-03-20 MED ORDER — CEPHALEXIN 500 MG PO CAPS
500.0000 mg | ORAL_CAPSULE | Freq: Once | ORAL | Status: AC
  Administered 2019-03-20: 500 mg via ORAL
  Filled 2019-03-20: qty 1

## 2019-03-20 NOTE — ED Notes (Signed)
Administered Bladder Scan, 1st attempt 23mL, 2nd attempt 61mL, 3rd attempt 36mL. Patient states have been urinating frequently.AS

## 2019-03-20 NOTE — ED Notes (Signed)
Pt c/o lower abd pain that radiates in to the left mid back - pt reports frequent urination and hematuria

## 2019-03-20 NOTE — ED Provider Notes (Signed)
[facility] Emergency Department Provider Note   ____________________________________________    I have reviewed the triage vital signs and the nursing notes.   HISTORY  Chief Complaint Urinary pain    HPI Terri [name] is a 23 y.o. female who presents with complaints of difficult urination, urinary frequency over the last several days.  Patient reports several days of urinary frequency although no significant dysuria at that time.  Today she woke up and found it difficult to urinate and had pain in her bladder region as well.  She has intermittent pain in her left flank/back.  No history of kidney stones.  Does have a history of urinary tract infections.  Has not had fevers or chills.  Does not take anything for this.  Is due for her menstrual cycle in 2 days  Past Medical History:  Diagnosis Date  . No pertinent past medical history     Patient Active Problem List   Diagnosis Date Noted  . GAD (generalized anxiety disorder) 01/16/2019  . Current moderate episode of major depressive disorder without prior episode ([place]) 01/16/2019  . PTSD (post-traumatic stress disorder) 01/16/2019  . Encounter for counseling regarding contraception 01/16/2019  . Tobacco use disorder 01/16/2019  . 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 CHOLECYSTECTOMY N/A 11/04/2018   Procedure: LAPAROSCOPIC CHOLECYSTECTOMY;  Surgeon: Olean Ree, MD;  Location: [facility] ORS;  Service: General;  Laterality: N/A;  . DILATION AND EVACUATION N/A 07/13/2017   Procedure: DILATATION AND EVACUATION;  Surgeon: Ward, Honor Loh, MD;  Location: [facility] ORS;  Service: Gynecology;  Laterality: N/A;  . ERCP N/A 11/14/2018   Procedure: ENDOSCOPIC RETROGRADE CHOLANGIOPANCREATOGRAPHY (ERCP);  Surgeon: Lucilla Lame, MD;  Location: [facility] ENDOSCOPY;  Service: Endoscopy;  Laterality: N/A;    Prior to Admission medications   Medication Sig Start Date  End Date Taking? Authorizing Provider  busPIRone (BUSPAR) 5 MG tablet Take 1 tablet (5 mg total) by mouth 3 (three) times daily. 03/02/19  Yes Bacigalupo, Dionne Bucy, MD  citalopram (CELEXA) 20 MG tablet Take 1 tablet (20 mg total) by mouth daily. 03/02/19  Yes Bacigalupo, Dionne Bucy, MD  ibuprofen (ADVIL,MOTRIN) 600 MG tablet Take 1 tablet (600 mg total) by mouth every 8 (eight) hours as needed for fever, mild pain or moderate pain. 11/04/18  Yes Piscoya, Jacqulyn Bath, MD  cephALEXin (KEFLEX) 500 MG capsule Take 1 capsule (500 mg total) by mouth 2 (two) times daily. 03/20/19   Lavonia Drafts, MD     Allergies Sulfa antibiotics and Zithromax [azithromycin]  Family History  Problem Relation Age of Onset  . Arthritis Mother   . Thyroid disease Mother   . Depression Mother   . Anxiety disorder Mother   . Anxiety disorder Maternal Grandmother   . Depression Maternal Grandmother   . COPD Maternal Grandmother   . ALS Maternal Grandfather   . Breast cancer Neg Hx   . Colon cancer Neg Hx     Social History Social History   Tobacco Use  . Smoking status: Current Every Day Smoker    Packs/day: 0.50    Years: 8.00    Pack years: 4.00    Types: Cigarettes  . Smokeless tobacco: Never Used  Substance Use Topics  . Alcohol use: No  . Drug use: No    Review of Systems  Constitutional: No fever/chills Eyes: No visual changes.  ENT: No sore throat. Cardiovascular: Denies chest pain. Respiratory: Denies shortness of breath.  Gastrointestinal: As above Genitourinary: As above Musculoskeletal: As above Skin: Negative for rash. Neurological: Negative for headaches or weakness   ____________________________________________   PHYSICAL EXAM:  VITAL SIGNS: ED Triage Vitals  Enc Vitals Group     BP 03/20/19 0608 140/69     Pulse Rate 03/20/19 0608 86     Resp 03/20/19 0608 (!) 22     Temp 03/20/19 0608 98.7 F (37.1 C)     Temp Source 03/20/19 0608 Oral     SpO2 03/20/19 0608 97 %     Weight  03/20/19 0554 97.5 kg (215 lb)     Height 03/20/19 0554 1.6 m (5\' 3" )     Head Circumference --      Peak Flow --      Pain Score 03/20/19 0601 10     Pain Loc --      Pain Edu? --      Excl. in GC? --     Constitutional: Alert and oriented.   Nose: No congestion/rhinnorhea. Mouth/Throat: Mucous membranes are moist.    Cardiovascular: Normal rate, regular rhythm. Grossly normal heart sounds.  Good peripheral circulation. Respiratory: Normal respiratory effort.  No retractions. Lungs CTAB. Gastrointestinal: Soft nontender, no distention, mild left CVA tenderness Genitourinary: deferred Musculoskeletal:  Warm and well perfused Neurologic:  Normal speech and language. No gross focal neurologic deficits are appreciated.  Skin:  Skin is warm, dry and intact. No rash noted. Psychiatric: Mood and affect are normal. Speech and behavior are normal.  ____________________________________________   LABS (all labs ordered are listed, but only abnormal results are displayed)  Labs Reviewed  URINALYSIS, COMPLETE (UACMP) WITH MICROSCOPIC - Abnormal; Notable for the following components:      Result Value   Color, Urine YELLOW (*)    APPearance CLOUDY (*)    Hgb urine dipstick LARGE (*)    Protein, ur 100 (*)    Nitrite POSITIVE (*)    Leukocytes,Ua LARGE (*)    RBC / HPF >50 (*)    WBC, UA >50 (*)    Non Squamous Epithelial PRESENT (*)    All other components within normal limits  URINE CULTURE  POCT PREGNANCY, URINE   ____________________________________________  EKG   ____________________________________________  RADIOLOGY  CT renal stone study negative for ureterolithiasis ____________________________________________   PROCEDURES  Procedure(s) performed: No  Procedures   Critical Care performed: No ____________________________________________   INITIAL IMPRESSION / ASSESSMENT AND PLAN / ED COURSE  Pertinent labs & imaging results that were available during my  care of the patient were reviewed by me and considered in my medical decision making (see chart for details).  Patient presents with pain as described above suspicious for urinary tract infection although given severity also possibility of kidney stone, will obtain imaging, urinalysis, POCT pregnancy.  Negative pregnancy test.  Urinalysis consistent with infection, CT scan negative for kidney stone.  Will treat with antibiotics, patient improved after IM Toradol.  Initial dose of Keflex given here, urine culture added    ____________________________________________   FINAL CLINICAL IMPRESSION(S) / ED DIAGNOSES  Final diagnoses:  Lower urinary tract infectious disease        Note:  This document was prepared using Dragon voice recognition software and may include unintentional dictation errors.   Jene EveryKinner, Tasheika Kitzmiller, MD 03/20/19 1215

## 2019-03-20 NOTE — ED Triage Notes (Signed)
Patient reports urinary retention since approximately 6 pm last night.  Reports she had urinary frequency for the past week without pain.

## 2019-03-22 LAB — URINE CULTURE: Culture: >=100000 — AB

## 2019-03-23 ENCOUNTER — Telehealth: Payer: Self-pay

## 2019-03-23 NOTE — Telephone Encounter (Signed)
Patient had called the after hours line and spoke to [facility], RN around 5:19 AM on 03/20/2019. "Patient states she has pain in her back. She is vomiting and nauseas. She has urge to urinate but is not." Patient end up going to ER shortly afterwards. FYI.

## 2019-03-29 ENCOUNTER — Ambulatory Visit (INDEPENDENT_AMBULATORY_CARE_PROVIDER_SITE_OTHER): Payer: Medicaid Other | Admitting: Family Medicine

## 2019-03-29 ENCOUNTER — Other Ambulatory Visit: Payer: Self-pay

## 2019-03-29 DIAGNOSIS — K047 Periapical abscess without sinus: Secondary | ICD-10-CM

## 2019-03-29 DIAGNOSIS — M791 Myalgia, unspecified site: Secondary | ICD-10-CM | POA: Diagnosis not present

## 2019-03-29 DIAGNOSIS — R509 Fever, unspecified: Secondary | ICD-10-CM

## 2019-03-29 MED ORDER — AMOXICILLIN-POT CLAVULANATE 875-125 MG PO TABS: 1.0000 | ORAL_TABLET | Freq: Two times a day (BID) | ORAL | 0 refills | Status: AC

## 2019-03-29 NOTE — Progress Notes (Signed)
Patient: Terri [name] Female    DOB: 10-18-1995   23 y.o.   MRN: [id number] Visit Date: 03/29/2019  Today's Provider: Shirlee LatchAngela , MD   Chief Complaint  Patient presents with  . Dental Pain  . URI   Subjective:    I, Terri [name] [name], am acting as a scribe for Shirlee LatchAngela , MD.  Virtual Visit via Video Note  I connected with Terri [name] on 03/29/19 at  3:40 PM EDT by a video enabled telemedicine application and verified that I am speaking with the correct person using two identifiers.   Patient location: home Provider location: home office Persons involved in the visit: patient, provider   I discussed the limitations of evaluation and management by telemedicine and the availability of in person appointments. The patient expressed understanding and agreed to proceed.  Dental Pain  The current episode started in the past 7 days. The problem has been gradually worsening. The pain is at a severity of 6/10. The pain is moderate. Associated symptoms include facial pain and a fever. Pertinent negatives include no difficulty swallowing or oral bleeding. She has tried acetaminophen for the symptoms. The treatment provided mild relief.  URI  The current episode started today. The maximum temperature recorded prior to her arrival was 100.4 - 100.9 F. The fever has been present for less than 1 day. Associated symptoms include coughing, nausea, vomiting and wheezing. Pertinent negatives include no congestion, headaches, sinus pain, sneezing or sore throat.   Fever, N/V/ myalgias x3 days  4-5 days of tooth pain L upper front tooth Broken ~1 yr ago Had infection previously and this feels similar    Allergies  Allergen Reactions  . Sulfa Antibiotics Other (See Comments)    Reaction: unknown.  Told allergic as child but believes she's taken since.  Marland Kitchen. Zithromax [Azithromycin] Other (See Comments)    Reaction: unknown Told reaction as a child      Current Outpatient Medications:  .  busPIRone (BUSPAR) 5 MG tablet, Take 1 tablet (5 mg total) by mouth 3 (three) times daily., Disp: 90 tablet, Rfl: 3 .  cephALEXin (KEFLEX) 500 MG capsule, Take 1 capsule (500 mg total) by mouth 2 (two) times daily., Disp: 14 capsule, Rfl: 0 .  citalopram (CELEXA) 20 MG tablet, Take 1 tablet (20 mg total) by mouth daily., Disp: 30 tablet, Rfl: 3 .  ibuprofen (ADVIL,MOTRIN) 600 MG tablet, Take 1 tablet (600 mg total) by mouth every 8 (eight) hours as needed for fever, mild pain or moderate pain., Disp: 30 tablet, Rfl: 0 .  amoxicillin-clavulanate (AUGMENTIN) 875-125 MG tablet, Take 1 tablet by mouth 2 (two) times daily for 7 days., Disp: 14 tablet, Rfl: 0  Review of Systems  Constitutional: Positive for fever.  HENT: Positive for dental problem. Negative for congestion, sinus pain, sneezing and sore throat.   Respiratory: Positive for cough and wheezing. Negative for chest tightness and shortness of breath.   Gastrointestinal: Positive for nausea and vomiting.  Neurological: Negative for headaches.    Social History   Tobacco Use  . Smoking status: Current Every Day Smoker    Packs/day: 0.50    Years: 8.00    Pack years: 4.00    Types: Cigarettes  . Smokeless tobacco: Never Used  Substance Use Topics  . Alcohol use: No      Objective:   There were no vitals taken for this visit. There were no vitals filed for this visit.  Physical Exam Constitutional:      General: She is not in acute distress.    Appearance: Normal appearance.  HENT:     Mouth/Throat:     Comments: L front upper tooth is broken off, but unable to appreciate any abscess due to poor video quality Pulmonary:     Effort: Pulmonary effort is normal. No respiratory distress.  Neurological:     Mental Status: She is alert and oriented to person, place, and time. Mental status is at baseline.  Psychiatric:        Mood and Affect: Mood normal.        Behavior: Behavior  normal.      No results found for any visits on 03/29/19.     Assessment & Plan    I discussed the assessment and treatment plan with the patient. The patient was provided an opportunity to ask questions and all were answered. The patient agreed with the plan and demonstrated an understanding of the instructions.   The patient was advised to call back or seek an in-person evaluation if the symptoms worsen or if the condition fails to improve as anticipated.  I provided 25 minutes of non-face-to-face time during this encounter.   1. Tooth abscess - recurrent problem and tooth is broken - will treat with Augmentin - Referral to dentistry as she may need other treatment - discussed return precautions - Ambulatory referral to Dentistry  2. Fever, unspecified fever cause 3. Myalgia - new problem - most likely 2/2 abscess of tooth, but given current pandemic, will send for OP COVID testing - advised on self-quarantine - Novel Coronavirus, NAA ([facility])    Meds ordered this encounter  Medications  . amoxicillin-clavulanate (AUGMENTIN) 875-125 MG tablet    Sig: Take 1 tablet by mouth 2 (two) times daily for 7 days.    Dispense:  14 tablet    Refill:  0     Return if symptoms worsen or fail to improve.   The entirety of the information documented in the History of Present Illness, Review of Systems and Physical Exam were personally obtained by me. Portions of this information were initially documented by [facility], [name] and reviewed by me for thoroughness and accuracy.    , Dionne Bucy, MD MPH [facility]

## 2019-03-30 ENCOUNTER — Other Ambulatory Visit: Payer: Self-pay

## 2019-03-30 ENCOUNTER — Telehealth: Payer: Self-pay

## 2019-03-30 ENCOUNTER — Emergency Department
Admission: EM | Admit: 2019-03-30 | Discharge: 2019-03-31 | Disposition: A | Discharge status: Home / Self Care | Payer: Medicaid Other | Attending: Emergency Medicine | Admitting: Emergency Medicine

## 2019-03-30 ENCOUNTER — Encounter: Payer: Self-pay | Admitting: Emergency Medicine

## 2019-03-30 ENCOUNTER — Emergency Department: Payer: Medicaid Other

## 2019-03-30 DIAGNOSIS — Z20828 Contact with and (suspected) exposure to other viral communicable diseases: Secondary | ICD-10-CM | POA: Diagnosis not present

## 2019-03-30 DIAGNOSIS — R509 Fever, unspecified: Secondary | ICD-10-CM | POA: Diagnosis not present

## 2019-03-30 DIAGNOSIS — J90 Pleural effusion, not elsewhere classified: Secondary | ICD-10-CM | POA: Diagnosis not present

## 2019-03-30 DIAGNOSIS — R197 Diarrhea, unspecified: Secondary | ICD-10-CM | POA: Insufficient documentation

## 2019-03-30 DIAGNOSIS — R1013 Epigastric pain: Secondary | ICD-10-CM | POA: Diagnosis not present

## 2019-03-30 DIAGNOSIS — F17210 Nicotine dependence, cigarettes, uncomplicated: Secondary | ICD-10-CM | POA: Diagnosis not present

## 2019-03-30 DIAGNOSIS — R1032 Left lower quadrant pain: Secondary | ICD-10-CM | POA: Diagnosis not present

## 2019-03-30 DIAGNOSIS — Z20822 Contact with and (suspected) exposure to covid-19: Secondary | ICD-10-CM

## 2019-03-30 DIAGNOSIS — R112 Nausea with vomiting, unspecified: Secondary | ICD-10-CM | POA: Diagnosis not present

## 2019-03-30 DIAGNOSIS — R6889 Other general symptoms and signs: Secondary | ICD-10-CM | POA: Diagnosis not present

## 2019-03-30 LAB — CBC WITH DIFFERENTIAL/PLATELET
Abs Immature Granulocytes: 0.12 10*3/uL — ABNORMAL HIGH (ref 0.00–0.07)
Basophils Absolute: 0.1 10*3/uL (ref 0.0–0.1)
Basophils Relative: 0 %
Eosinophils Absolute: 0 10*3/uL (ref 0.0–0.5)
Eosinophils Relative: 0 %
HCT: 39.3 % (ref 36.0–46.0)
Hemoglobin: 13.8 g/dL (ref 12.0–15.0)
Immature Granulocytes: 1 %
Lymphocytes Relative: 4 %
Lymphs Abs: 0.9 10*3/uL (ref 0.7–4.0)
MCH: 28.3 pg (ref 26.0–34.0)
MCHC: 35.1 g/dL (ref 30.0–36.0)
MCV: 80.7 fL (ref 80.0–100.0)
Monocytes Absolute: 2 10*3/uL — ABNORMAL HIGH (ref 0.1–1.0)
Monocytes Relative: 9 %
Neutro Abs: 20.3 10*3/uL — ABNORMAL HIGH (ref 1.7–7.7)
Neutrophils Relative %: 86 %
Platelets: 184 10*3/uL (ref 150–400)
RBC: 4.87 MIL/uL (ref 3.87–5.11)
RDW: 12.4 % (ref 11.5–15.5)
WBC: 23.4 10*3/uL — ABNORMAL HIGH (ref 4.0–10.5)
nRBC: 0 % (ref 0.0–0.2)

## 2019-03-30 LAB — COMPREHENSIVE METABOLIC PANEL
ALT: 12 U/L (ref 0–44)
AST: 15 U/L (ref 15–41)
Albumin: 3.9 g/dL (ref 3.5–5.0)
Alkaline Phosphatase: 67 U/L (ref 38–126)
Anion gap: 11 (ref 5–15)
BUN: 9 mg/dL (ref 6–20)
CO2: 20 mmol/L — ABNORMAL LOW (ref 22–32)
Calcium: 8.6 mg/dL — ABNORMAL LOW (ref 8.9–10.3)
Chloride: 105 mmol/L (ref 98–111)
Creatinine, Ser: 0.59 mg/dL (ref 0.44–1.00)
GFR calc Af Amer: >60 mL/min (ref >60)
GFR calc non Af Amer: >60 mL/min (ref >60)
Glucose, Bld: 135 mg/dL — ABNORMAL HIGH (ref 70–99)
Potassium: 3 mmol/L — ABNORMAL LOW (ref 3.5–5.1)
Sodium: 136 mmol/L (ref 135–145)
Total Bilirubin: 1.3 mg/dL — ABNORMAL HIGH (ref 0.3–1.2)
Total Protein: 8.1 g/dL (ref 6.5–8.1)

## 2019-03-30 LAB — PROTIME-INR
INR: 1.1 (ref 0.8–1.2)
Prothrombin Time: 14.3 seconds (ref 11.4–15.2)

## 2019-03-30 LAB — HCG, QUANTITATIVE, PREGNANCY: hCG, Beta Chain, Quant, S: <1 m[IU]/mL (ref <5)

## 2019-03-30 LAB — LACTIC ACID, PLASMA: Lactic Acid, Venous: 0.8 mmol/L (ref 0.5–1.9)

## 2019-03-30 MED ORDER — SODIUM CHLORIDE 0.9 % IV BOLUS
1000.0000 mL | Freq: Once | INTRAVENOUS | Status: AC
  Administered 2019-03-30: 1000 mL via INTRAVENOUS

## 2019-03-30 MED ORDER — ONDANSETRON HCL 4 MG/2ML IJ SOLN
4.0000 mg | Freq: Once | INTRAMUSCULAR | Status: AC
  Administered 2019-03-30: 21:00:00 4 mg via INTRAVENOUS
  Filled 2019-03-30: qty 2

## 2019-03-30 MED ORDER — IBUPROFEN 600 MG PO TABS
600.0000 mg | ORAL_TABLET | Freq: Once | ORAL | Status: AC
  Administered 2019-03-30: 21:00:00 600 mg via ORAL
  Filled 2019-03-30: qty 1

## 2019-03-30 NOTE — Telephone Encounter (Signed)
Patient called the office today 03/30/2019 and stated that she is having a fever of 102.0, vomiting and now side pain. Patient was seen on yesterday and had a COVID-19 test done and was advised that her results was not back yet. Patient did no want a virtual visit again just wanted advice on what to do. I advised patient if she begin having more symptoms and pain becomes severe to go the ER. Patient stated that she may just go to the ER because she feels worse then yesterday.

## 2019-03-30 NOTE — Telephone Encounter (Signed)
Any abd pain, diarrhea, urinary symptoms?  Is she taking the antibiotic for her tooth abscess?

## 2019-03-30 NOTE — Telephone Encounter (Signed)
Patient was advised and stated she finally was able to get her antibiotic and has taking one.

## 2019-03-30 NOTE — Telephone Encounter (Signed)
Ok. No change to plan. Needs to start antibiotic.  If continues to get worse, may need to go to ED.  If she does not treat with infection of her tooth, it can lead to sepsis and spreading of the infection.

## 2019-03-30 NOTE — ED Notes (Signed)
MD Jari Pigg made aware of pt's chief complaints, symptoms, and vitals signs.

## 2019-03-30 NOTE — ED Triage Notes (Signed)
PT arrives with complaints of LLQ abdominal pain and n/v/d that started "a few days ago." Pt reports the abdominal pain feels like "some one is squeezing it." pt reports taking tylenol 1 hour prior to arrival. Pt states 2 days prior she was started on Augmentin for a abscess in her tooth.

## 2019-03-30 NOTE — Telephone Encounter (Signed)
Patient states she is not having any abd pain, diarrhea or urinary symptoms. Patient states that she has not gotten her antibiotic yet because she does not have anyone to go pick it up for her.FYI

## 2019-03-31 ENCOUNTER — Telehealth: Payer: Self-pay

## 2019-03-31 LAB — URINALYSIS, COMPLETE (UACMP) WITH MICROSCOPIC
Bacteria, UA: NONE SEEN
Bilirubin Urine: NEGATIVE
Glucose, UA: NEGATIVE mg/dL
Hgb urine dipstick: NEGATIVE
Ketones, ur: 5 mg/dL — AB
Nitrite: NEGATIVE
Protein, ur: 30 mg/dL — AB
Specific Gravity, Urine: 1.023 (ref 1.005–1.030)
pH: 5 (ref 5.0–8.0)

## 2019-03-31 LAB — LACTIC ACID, PLASMA: Lactic Acid, Venous: 0.6 mmol/L (ref 0.5–1.9)

## 2019-03-31 LAB — SARS CORONAVIRUS 2 BY RT PCR (HOSPITAL ORDER, PERFORMED IN ~~LOC~~ HOSPITAL LAB): SARS Coronavirus 2: NEGATIVE

## 2019-03-31 MED ORDER — ACETAMINOPHEN 500 MG PO TABS
1000.0000 mg | ORAL_TABLET | Freq: Once | ORAL | Status: AC
  Administered 2019-03-31: 01:00:00 1000 mg via ORAL
  Filled 2019-03-31: qty 2

## 2019-03-31 MED ORDER — ONDANSETRON HCL 4 MG PO TABS: 4.0000 mg | ORAL_TABLET | Freq: Every day | ORAL | 0 refills | Status: DC | PRN

## 2019-03-31 MED ORDER — SODIUM CHLORIDE 0.9 % IV BOLUS
1000.0000 mL | Freq: Once | INTRAVENOUS | Status: AC
  Administered 2019-03-31: 01:00:00 1000 mL via INTRAVENOUS

## 2019-03-31 MED ORDER — PROMETHAZINE HCL 25 MG/ML IJ SOLN
12.5000 mg | Freq: Once | INTRAMUSCULAR | Status: AC
  Administered 2019-03-31: 12.5 mg via INTRAVENOUS

## 2019-03-31 NOTE — Telephone Encounter (Signed)
Patient called stating that she was seen in the ER last night for nausea and vomiting. She states that she is no better today. She complains of nausea, vomiting, fever up to 103.4 and abdominal swelling. She also reports that it hurts to deep breath on her sides. These symptoms have been ongoing for the past 4 days. I consulted with Dr. B who recommended symptom management and staying Hydrated. Patient states that she has been taking tylenol for body aches and fever and it is not helping. She says the hospital gave her a prescription for Zofran and that is not helping either. There are no more available appointments on the schedule today. Please advise.

## 2019-03-31 NOTE — Telephone Encounter (Signed)
Pt advised.   Thanks,   -Laura  

## 2019-03-31 NOTE — ED Provider Notes (Addendum)
[facility] Emergency Department Provider Note  ____________________________________________   I have reviewed the triage vital signs and the nursing notes. Where available I have reviewed prior notes and, if possible and indicated, outside hospital notes.   Patient seen and evaluated during the coronavirus epidemic during a time with low staffing  Patient seen for the symptoms described in the history of present illness. She was evaluated in the context of the global COVID-19 pandemic, which necessitated consideration that the patient might be at risk for infection with the SARS-CoV-2 virus that causes COVID-19. Institutional protocols and algorithms that pertain to the evaluation of patients at risk for COVID-19 are in a state of rapid change based on information released by regulatory bodies including the CDC and federal and state organizations. These policies and algorithms were followed during the patient's care in the ED.    HISTORY  Chief Complaint Abdominal Pain, Emesis, and Diarrhea    HPI Terri [name] is a 23 y.o. female with a history of GAD, PTSD, status post cholecystectomy remotely, other medical problems are reviewed below, presents today complaining of  2 days of watery diarrhea and 3 days of nausea and vomiting.  Vomited several times today.  Has some minimal epigastric discomfort no left lower quadrant pain.  Patient did have a recent urinary tract infection was treated antibiotics and became completely well afterwards.  At that time, 10 days ago she had a reassuring CT scan of her abdomen and pelvis with no evidence of diverticulosis.  Patient denies pregnancy or vaginal bleeding, she states that she has had no hematemesis melena bright red blood per rectum.  She states the discomfort in epigastric region started after she was vomiting for a day or 2.  Not before.  Has had fevers and body aches.  No change in taste.  No known exposure to the  coronavirus.  No sore throat.  Does have chronic dental pain from a bad tooth in the front but is not red or swollen.    Past Medical History:  Diagnosis Date  . No pertinent past medical history     Patient Active Problem List   Diagnosis Date Noted  . GAD (generalized anxiety disorder) 01/16/2019  . Current moderate episode of major depressive disorder without prior episode (HCC) 01/16/2019  . PTSD (post-traumatic stress disorder) 01/16/2019  . Encounter for counseling regarding contraception 01/16/2019  . Tobacco use disorder 01/16/2019  . 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 CHOLECYSTECTOMY N/A 11/04/2018   Procedure: LAPAROSCOPIC CHOLECYSTECTOMY;  Surgeon: Henrene DodgePiscoya, Jose, MD;  Location: [facility] ORS;  Service: General;  Laterality: N/A;  . DILATION AND EVACUATION N/A 07/13/2017   Procedure: DILATATION AND EVACUATION;  Surgeon: Ward, Elenora Fenderhelsea C, MD;  Location: [facility] ORS;  Service: Gynecology;  Laterality: N/A;  . ERCP N/A 11/14/2018   Procedure: ENDOSCOPIC RETROGRADE CHOLANGIOPANCREATOGRAPHY (ERCP);  Surgeon: Midge MiniumWohl, Darren, MD;  [facility];  Service: Endoscopy;  Laterality: N/A;    Prior to Admission medications   Medication Sig Start Date End Date Taking? Authorizing Provider  amoxicillin-clavulanate (AUGMENTIN) 875-125 MG tablet Take 1 tablet by mouth 2 (two) times daily for 7 days. 03/29/19 04/05/19  Erasmo DownerBacigalupo, Angela M, MD  busPIRone (BUSPAR) 5 MG tablet Take 1 tablet (5 mg total) by mouth 3 (three) times daily. 03/02/19   Bacigalupo, Marzella SchleinAngela M, MD  cephALEXin (KEFLEX) 500 MG capsule Take 1 capsule (500 mg total) by mouth 2 (two) times daily. 03/20/19  Jene EveryKinner, Robert, MD  citalopram (CELEXA) 20 MG tablet Take 1 tablet (20 mg total) by mouth daily. 03/02/19   Erasmo DownerBacigalupo, Angela M, MD  ibuprofen (ADVIL,MOTRIN) 600 MG tablet Take 1 tablet (600 mg total) by mouth every 8 (eight) hours as needed for fever, mild pain or moderate  pain. 11/04/18   Henrene DodgePiscoya, Jose, MD    Allergies Sulfa antibiotics and Zithromax [azithromycin]  Family History  Problem Relation Age of Onset  . Arthritis Mother   . Thyroid disease Mother   . Depression Mother   . Anxiety disorder Mother   . Anxiety disorder Maternal Grandmother   . Depression Maternal Grandmother   . COPD Maternal Grandmother   . ALS Maternal Grandfather   . Breast cancer Neg Hx   . Colon cancer Neg Hx     Social History Social History   Tobacco Use  . Smoking status: Current Every Day Smoker    Packs/day: 0.50    Years: 8.00    Pack years: 4.00    Types: Cigarettes  . Smokeless tobacco: Never Used  Substance Use Topics  . Alcohol use: No  . Drug use: No    Review of Systems Constitutional: No fever/chills Eyes: No visual changes. ENT: No sore throat. No stiff neck no neck pain Cardiovascular: Denies chest pain. Respiratory: Denies shortness of breath. Gastrointestinal:   no vomiting.  No diarrhea.  No constipation. Genitourinary: Negative for dysuria. Musculoskeletal: Negative lower extremity swelling Skin: Negative for rash. Neurological: Negative for severe headaches, focal weakness or numbness.   ____________________________________________   PHYSICAL EXAM:  VITAL SIGNS: ED Triage Vitals  Enc Vitals Group     BP 03/30/19 2047 105/76     Pulse Rate 03/30/19 2047 (!) 125     Resp 03/30/19 2047 (!) 24     Temp 03/30/19 2047 (!) 100.8 F (38.2 C)     Temp Source 03/30/19 2047 Oral     SpO2 03/30/19 2047 96 %     Weight 03/30/19 2048 220 lb (99.8 kg)     Height 03/30/19 2048 5\' 3"  (1.6 m)     Head Circumference --      Peak Flow --      Pain Score 03/30/19 2048 8     Pain Loc --      Pain Edu? --      Excl. in [place]? --     Constitutional: Alert and oriented. Well appearing and in no acute distress. Eyes: Conjunctivae are normal Head: Atraumatic HEENT: No congestion/rhinnorhea. Mucous membranes are moist.  Oropharynx  non-erythematous there is a significantly decayed tooth in the front left but there is no evidence of infection around the tooth. Neck:   Nontender with no meningismus, no masses, no stridor Cardiovascular: Normal rate, regular rhythm. Grossly normal heart sounds.  Good peripheral circulation. Respiratory: Normal respiratory effort.  No retractions. Lungs CTAB. Abdominal: Soft and nontender. No distention. No guarding no rebound Back:  There is no focal tenderness or step off.  there is no midline tenderness there are no lesions noted. there is no CVA tenderness Musculoskeletal: No lower extremity tenderness, no upper extremity tenderness. No joint effusions, no DVT signs strong distal pulses no edema Neurologic:  Normal speech and language. No gross focal neurologic deficits are appreciated.  Skin:  Skin is warm, dry and intact. No rash noted. Psychiatric: Mood and affect are normal. Speech and behavior are normal.  ____________________________________________   LABS (all labs ordered are listed, but only abnormal results are  displayed)  Labs Reviewed  COMPREHENSIVE METABOLIC PANEL - Abnormal; Notable for the following components:      Result Value   Potassium 3.0 (*)    CO2 20 (*)    Glucose, Bld 135 (*)    Calcium 8.6 (*)    Total Bilirubin 1.3 (*)    All other components within normal limits  CBC WITH DIFFERENTIAL/PLATELET - Abnormal; Notable for the following components:   WBC 23.4 (*)    Neutro Abs 20.3 (*)    Monocytes Absolute 2.0 (*)    Abs Immature Granulocytes 0.12 (*)    All other components within normal limits  CULTURE, BLOOD (ROUTINE X 2)  CULTURE, BLOOD (ROUTINE X 2)  SARS CORONAVIRUS 2 (HOSPITAL ORDER, PERFORMED IN [facility])  LACTIC ACID, PLASMA  LACTIC ACID, PLASMA  PROTIME-INR  HCG, QUANTITATIVE, PREGNANCY  URINALYSIS, COMPLETE (UACMP) WITH MICROSCOPIC  POC URINE PREG, ED    Pertinent labs  results that were available during my care of the  patient were reviewed by me and considered in my medical decision making (see chart for details). ____________________________________________  EKG  I personally interpreted any EKGs ordered by me or triage  ____________________________________________  RADIOLOGY  Pertinent labs & imaging results that were available during my care of the patient were reviewed by me and considered in my medical decision making (see chart for details). If possible, patient and/or family made aware of any abnormal findings.  Dg Chest 1 View  Result Date: 03/30/2019 CLINICAL DATA:  Left lower quadrant abdominal pain, nausea/vomiting EXAM: CHEST  1 VIEW COMPARISON:  10/28/2018 FINDINGS: Lungs are clear.  No pleural effusion or pneumothorax. The heart is normal in size. IMPRESSION: No evidence of acute cardiopulmonary disease. Electronically Signed   By: Charline BillsSriyesh  Krishnan M.D.   On: 03/30/2019 23:33   ____________________________________________    PROCEDURES  Procedure(s) performed: None  Procedures  Critical Care performed: None  ____________________________________________   INITIAL IMPRESSION / ASSESSMENT AND PLAN / ED COURSE  Pertinent labs & imaging results that were available during my care of the patient were reviewed by me and considered in my medical decision making (see chart for details).   Patient here with vomiting fever diarrhea and feeling generally unwell for a few days.  No evidence at this time of significant intra-abdominal pathology, although her white count is noted, she has no focal tenderness despite 3 days of discomfort.  We did do a chest x-ray given prevalence of coronavirus which is negative we will check coronavirus, will check urinalysis, patient is in no acute distress.  Vital signs are reassuring, we are giving her IV fluids and antiemetics and antipyretics, and we will reassess.  ----------------------------------------- 2:52 AM on  03/31/2019 -----------------------------------------  We have reexamined patient several times when she has been here serial abdominal exams betray no evidence of abdominal discomfort, I do not think a CT scan therefore is warranted, I do note the white count.  Patient has been here for almost 6 hours and has been able to give us a stool sample.  She feels that her diarrhea is actually getting better.  We did offer to keep her here until she gave us a stool sample but she would prefer to go home.  She is sound asleep in the room and feels feel better.  Urinalysis is with trace leukocyte, we will send urine culture.  Will avoid further antibiotics given recent diarrhea until we have culture verified bacterial infection.  Reassuringly, patient has a  only ketone level of 5 in her urine and she does appear to be quite well hydrated she is not complaining of any pain at this time she has been able to sleep comfortably in the room and she is in no acute distress.  She and I talked about recurrent CT scan and she declines I do not think that is unreasonable.  Is a large radiation burden and a 23 year old.  Patient does have negative COVID and there is no evidence of sepsis with her vital signs and her serial lactic acids being reassuring.  Given the patient declines further work-up looks a lot better we will try p.o. challenge if she can tolerate it we will discharge her with close outpatient follow-up and return precautions given and understood.  This is certainly consistent with a viral gastroenteritis but she is instructed to follow closely with her primary care doctor with stool samples if she has continuing diarrhea given the recent antibiosis and possibility of C. difficile etc.  However, fortunately she does not seem to have any diarrhea here.    ____________________________________________   FINAL CLINICAL IMPRESSION(S) / ED DIAGNOSES  Final diagnoses:  Fever      This chart was dictated using  voice recognition software.  Despite best efforts to proofread,  errors can occur which can change meaning.      Schuyler Amor, MD 03/31/19 1941    Schuyler Amor, MD 03/31/19 [phone]

## 2019-03-31 NOTE — Telephone Encounter (Signed)
Can try Benadryl or tylenol/advil PM.  If pain worsens, go to urgent care

## 2019-03-31 NOTE — Discharge Instructions (Addendum)
It appears that you most likely have a viral gastroenteritis, there are certain bacterial infections which can cause this, unfortunately we are unable to get a stool sample to verify the presence.  If you have ongoing symptoms including increased pain, fever, persistent vomiting despite medication, any bleeding from your bottom, significant ongoing diarrhea, or you feel worse in any way, please return to the emergency department.  Otherwise, please follow closely with your primary care doctor.  Take the antinausea medications as needed for nausea.  We have talked about CT scan, at this time you would prefer not to have an I do not think that is unreasonable as your abdomen is benign but if you do feel worse in any way please return and we will reevaluate.  We have sent cultures, if there is any evidence of infection that we need to address we will let you know.

## 2019-03-31 NOTE — Telephone Encounter (Signed)
Per Dr. Jacinto Reap, patient should go to Urgent care if she needs to be re-evaluated. Patient advised and verbally voiced understanding. Patient wants to know if Dr. B would call something in to the pharmacy to help her sleep. She says its hard to sleep since she has pain in her sides.  Pharm[facility]: [facility] [address]

## 2019-04-01 LAB — URINE CULTURE: Culture: <10000 — AB

## 2019-04-02 LAB — NOVEL CORONAVIRUS, NAA: SARS-CoV-2, NAA: NOT DETECTED

## 2019-04-03 ENCOUNTER — Telehealth: Payer: Self-pay

## 2019-04-03 NOTE — Telephone Encounter (Signed)
-----   Message from Virginia Crews, MD sent at 04/03/2019  9:34 AM EDT ----- Patient is negative for COVID 19.  If he/she was tested due to exposure or symptoms, he/she still needs to quarantine for 14 days from exposure or 10 days from start of symptoms and 3 days after resolution of fever.  The test is not 100% accurate.

## 2019-04-03 NOTE — Telephone Encounter (Signed)
LMTCB

## 2019-04-04 LAB — CULTURE, BLOOD (ROUTINE X 2)
Culture: NO GROWTH
Culture: NO GROWTH
Special Requests: ADEQUATE
Special Requests: ADEQUATE

## 2019-04-10 NOTE — Telephone Encounter (Signed)
LMTCB

## 2019-04-11 NOTE — Telephone Encounter (Signed)
Patient was advised.  

## 2019-04-13 ENCOUNTER — Ambulatory Visit: Payer: Self-pay | Admitting: Family Medicine

## 2019-07-12 ENCOUNTER — Emergency Department
Admission: EM | Admit: 2019-07-12 | Discharge: 2019-07-12 | Disposition: A | Discharge status: Home / Self Care | Payer: Medicaid Other | Attending: Emergency Medicine | Admitting: Emergency Medicine

## 2019-07-12 ENCOUNTER — Other Ambulatory Visit: Payer: Self-pay

## 2019-07-12 ENCOUNTER — Encounter: Payer: Self-pay | Admitting: Emergency Medicine

## 2019-07-12 DIAGNOSIS — K0889 Other specified disorders of teeth and supporting structures: Secondary | ICD-10-CM | POA: Diagnosis present

## 2019-07-12 DIAGNOSIS — F17210 Nicotine dependence, cigarettes, uncomplicated: Secondary | ICD-10-CM | POA: Insufficient documentation

## 2019-07-12 DIAGNOSIS — K029 Dental caries, unspecified: Secondary | ICD-10-CM | POA: Diagnosis not present

## 2019-07-12 MED ORDER — TRAMADOL HCL 50 MG PO TABS: 50.0000 mg | ORAL_TABLET | Freq: Four times a day (QID) | ORAL | 0 refills | Status: DC | PRN

## 2019-07-12 MED ORDER — AMOXICILLIN 500 MG PO CAPS: 500.0000 mg | ORAL_CAPSULE | Freq: Three times a day (TID) | ORAL | 0 refills | Status: DC

## 2019-07-12 NOTE — ED Provider Notes (Signed)
[facility] Emergency Department Provider Note  ____________________________________________   First MD Initiated Contact with Patient 07/12/19 1511     (approximate)  I have reviewed the triage vital signs and the nursing notes.   HISTORY  Chief Complaint Dental Pain    HPI Terri [name] is a 23 y.o. female presents the ED complaining of right-sided lower tooth pain.  No fever or chills.  Symptoms for at least 1 week.  History of bad dentition.  She states now the pain is radiating into the jaw and up into her ear.  No chest pain or shortness of breath.    Past Medical History:  Diagnosis Date  . No pertinent past medical history     Patient Active Problem List   Diagnosis Date Noted  . GAD (generalized anxiety disorder) 01/16/2019  . Current moderate episode of major depressive disorder without prior episode (HCC) 01/16/2019  . PTSD (post-traumatic stress disorder) 01/16/2019  . Encounter for counseling regarding contraception 01/16/2019  . Tobacco use disorder 01/16/2019  . 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 CHOLECYSTECTOMY N/A 11/04/2018   Procedure: LAPAROSCOPIC CHOLECYSTECTOMY;  Surgeon: Henrene DodgePiscoya, Jose, MD;  Location: [facility] ORS;  Service: General;  Laterality: N/A;  . DILATION AND EVACUATION N/A 07/13/2017   Procedure: DILATATION AND EVACUATION;  Surgeon: Ward, Elenora Fenderhelsea C, MD;  Location: [facility] ORS;  Service: Gynecology;  Laterality: N/A;  . ERCP N/A 11/14/2018   Procedure: ENDOSCOPIC RETROGRADE CHOLANGIOPANCREATOGRAPHY (ERCP);  Surgeon: Midge MiniumWohl, Darren, MD;  [facility];  Service: Endoscopy;  Laterality: N/A;    Prior to Admission medications   Medication Sig Start Date End Date Taking? Authorizing Provider  amoxicillin (AMOXIL) 500 MG capsule Take 1 capsule (500 mg total) by mouth 3 (three) times daily. 07/12/19   , Roselyn Bering W, PA-C  busPIRone (BUSPAR) 5 MG tablet Take 1  tablet (5 mg total) by mouth 3 (three) times daily. 03/02/19   Erasmo DownerBacigalupo, Angela M, MD  citalopram (CELEXA) 20 MG tablet Take 1 tablet (20 mg total) by mouth daily. 03/02/19   Bacigalupo, Marzella SchleinAngela M, MD  traMADol (ULTRAM) 50 MG tablet Take 1 tablet (50 mg total) by mouth every 6 (six) hours as needed. 07/12/19   Faythe Ghee,  W, PA-C    Allergies Sulfa antibiotics and Zithromax [azithromycin]  Family History  Problem Relation Age of Onset  . Arthritis Mother   . Thyroid disease Mother   . Depression Mother   . Anxiety disorder Mother   . Anxiety disorder Maternal Grandmother   . Depression Maternal Grandmother   . COPD Maternal Grandmother   . ALS Maternal Grandfather   . Breast cancer Neg Hx   . Colon cancer Neg Hx     Social History Social History   Tobacco Use  . Smoking status: Current Every Day Smoker    Packs/day: 0.50    Years: 8.00    Pack years: 4.00    Types: Cigarettes  . Smokeless tobacco: Never Used  Substance Use Topics  . Alcohol use: No  . Drug use: No    Review of Systems  Constitutional: No fever/chills Eyes: No visual changes. ENT: No sore throat.  Positive for dental pain Respiratory: Denies cough Genitourinary: Negative for dysuria. Musculoskeletal: Negative for back pain. Skin: Negative for rash.    ____________________________________________   PHYSICAL EXAM:  VITAL SIGNS: ED Triage Vitals  Enc Vitals Group     BP 07/12/19 1442 106/62  Pulse Rate 07/12/19 1441 91     Resp 07/12/19 1441 16     Temp 07/12/19 1441 98.9 F (37.2 C)     Temp Source 07/12/19 1441 Oral     SpO2 07/12/19 1441 98 %     Weight --      Height --      Head Circumference --      Peak Flow --      Pain Score 07/12/19 1441 7     Pain Loc --      Pain Edu? --      Excl. in [place]? --     Constitutional: Alert and oriented. Well appearing and in no acute distress. Eyes: Conjunctivae are normal.  Head: Atraumatic. Nose: No congestion/rhinnorhea.  Mouth/Throat: Mucous membranes are moist.  Positive for poor dentition with slight gum swelling noted at the right lower molars Neck:  supple no lymphadenopathy noted Cardiovascular: Normal rate, regular rhythm. Heart sounds are normal Respiratory: Normal respiratory effort.  No retractions, lungs c t a  GU: deferred Musculoskeletal: FROM all extremities, warm and well perfused Neurologic:  Normal speech and language.  Skin:  Skin is warm, dry and intact. No rash noted. Psychiatric: Mood and affect are normal. Speech and behavior are normal.  ____________________________________________   LABS (all labs ordered are listed, but only abnormal results are displayed)  Labs Reviewed - No data to display ____________________________________________   ____________________________________________  RADIOLOGY    ____________________________________________   PROCEDURES  Procedure(s) performed: No  Procedures    ____________________________________________   INITIAL IMPRESSION / ASSESSMENT AND PLAN / ED COURSE  Pertinent labs & imaging results that were available during my care of the patient were reviewed by me and considered in my medical decision making (see chart for details).   Patient is 23 year old female presents emergency department with concerns of dental pain.  See HPI. Physical exam shows the patient to have poor dentition.  Explained findings to the patient.  She is given prescription for amoxicillin and tramadol.  She is to continue take ibuprofen and Tylenol.  Follow-up with one of the dental clinics that was provided on her discharge instructions.  She was discharged in stable condition.    Terri [name] was evaluated in Emergency Department on 07/12/2019 for the symptoms described in the history of present illness. She was evaluated in the context of the global COVID-19 pandemic, which necessitated consideration that the patient might be at risk for  infection with the SARS-CoV-2 virus that causes COVID-19. Institutional protocols and algorithms that pertain to the evaluation of patients at risk for COVID-19 are in a state of rapid change based on information released by regulatory bodies including the CDC and federal and state organizations. These policies and algorithms were followed during the patient's care in the ED.   As part of my medical decision making, I reviewed the following data within the [place] notes reviewed and incorporated, Old chart reviewed, Notes from prior ED visits and [place] Controlled Substance Database  ____________________________________________   FINAL CLINICAL IMPRESSION(S) / ED DIAGNOSES  Final diagnoses:  Dental caries  Pain due to dental caries      NEW MEDICATIONS STARTED DURING THIS VISIT:  New Prescriptions   AMOXICILLIN (AMOXIL) 500 MG CAPSULE    Take 1 capsule (500 mg total) by mouth 3 (three) times daily.   TRAMADOL (ULTRAM) 50 MG TABLET    Take 1 tablet (50 mg total) by mouth every 6 (six) hours as  needed.     Note:  This document was prepared using Dragon voice recognition software and may include unintentional dictation errors.    Faythe Ghee, PA-C 07/12/19 1527    Shaune Pollack, MD 07/12/19 [phone]

## 2019-07-12 NOTE — Discharge Instructions (Addendum)
OPTIONS FOR DENTAL FOLLOW UP CARE ° °[place] Department of Health and Human Services - Local Safety Net Dental Clinics °http://www.ncdhhs.gov/dph/oralhealth/services/safetynetclinics.htm °  °[facility] ([phone]) ° °[facility] ([phone]) ° °[facility] ([phone] ext [phone]) ° °[facility] ([phone]) ° °[facility] ([phone]) °This clinic caters to the indigent population and is on a lottery system. °Location: °[facility], [address], [place] °Clinic Hours: °Wednesdays from 6pm - 9pm, patients seen by a lottery system. °For dates, call or go to [email or web address] °Services: °Cleanings, fillings and simple extractions. °Payment Options: °DENTAL WORK IS FREE OF CHARGE. Bring proof of income or support. °Best way to get seen: °Arrive at 5:15 pm - this is a lottery, NOT first come/first serve, so arriving earlier will not increase your chances of being seen. °  °  °[facility] °[phone] °Select option 1 for emergencies °  °Location: °[facility], [address], [place] °Clinic Hours: °No walk-ins accepted - call the day before to schedule an appointment. °Check in times are 9:30 am and 1:30 pm. °Services: °Simple extractions, temporary fillings, pulpectomy/pulp debridement, uncomplicated abscess drainage. °Payment Options: °PAYMENT IS DUE AT THE TIME OF SERVICE.  Fee is usually $100-200, additional surgical procedures (e.g. abscess drainage) may be extra. °Cash, checks, Visa/MasterCard accepted.  Can file Medicaid if patient is covered for dental - patient should call case worker to check. °No discount for [facility] patients. °Best way to get seen: °MUST call the day before and get onto the schedule. Can usually be seen the next 1-2 days. No walk-ins accepted. °  °  °[facility] °[phone] °   °Location: °[facility], [address], [place] °Clinic Hours: °M, W, Th, F 8am or 1:30pm, Tues 9a or 1:30 - first come/first served. °Services: °Simple extractions, temporary fillings, uncomplicated abscess drainage.  You do not need to be an [place] resident. °Payment Options: °PAYMENT IS DUE AT THE TIME OF SERVICE. °Dental insurance, otherwise sliding scale - bring proof of income or support. °Depending on income and treatment needed, cost is usually $50-200. °Best way to get seen: °Arrive early as it is first come/first served. °  °  °[facility] °[phone] °  °Location: °[address] °Clinic Hours: °Mon-Thu 8a-5p °Services: °Most basic dental services including extractions and fillings. °Payment Options: °PAYMENT IS DUE AT THE TIME OF SERVICE. °Sliding scale, up to 50% off - bring proof if income or support. °Medicaid with dental option accepted. °Best way to get seen: °Call to schedule an appointment, can usually be seen within 2 weeks OR they will try to see walk-ins - show up at 8a or 2p (you may have to wait). °  °  °[facility] °[phone] °[place] RESIDENTS ONLY °  °Location: °[facility], [address], [place], [place] [address] °Clinic Hours: By appointment only. °Monday - Thursday 8am-5pm, Friday 8am-12pm °Services: Cleanings, fillings, extractions. °Payment Options: °PAYMENT IS DUE AT THE TIME OF SERVICE. °Cash, Visa or MasterCard. Sliding scale - $30 minimum per service. °Best way to get seen: °Come in to office, complete packet and make an appointment - need proof of income °or support monies for each household member and proof of [place] residence. °Usually takes about a month to get in. °  °  °[facility] °[phone] °  °Location: °[address]   [place] °Clinic Hours: Walk-in Urgent Care Dental Services are offered Monday-Friday  mornings only. °The numbers of emergencies accepted daily is limited to the number of °providers available. °Maximum 15 - Mondays, Wednesdays & Thursdays °Maximum 10 - Tuesdays & Fridays °Services: °You do not need to be a [place] resident to be seen for a dental emergency. °Emergencies are defined as pain, swelling, abnormal bleeding, or dental trauma. Walkins will receive x-rays if needed. °NOTE: Dental cleaning is not an emergency. °Payment Options: °PAYMENT IS DUE AT THE TIME OF SERVICE. °Minimum co-pay is $40.00 for uninsured patients. °Minimum co-pay is $3.00 for Medicaid with dental coverage. °Dental Insurance is accepted and must be presented at time of visit. °Medicare does not cover dental. °Forms of payment: Cash, credit card, checks. °Best way to get seen: °If not previously registered with the clinic, walk-in dental registration begins at 7:15 am and is on a first come/first serve basis. °If previously registered with the clinic, call to make an appointment. °  °  °[facility] °[phone] °[place] RESIDENTS ONLY °  °Location: °[address], [place], [place] °Clinic Hours: °Mon-Thu 10a-2p °Services: Extractions only! °Payment Options: °FREE (donations accepted) - bring proof of income or support °Best way to get seen: °Call and schedule an appointment OR come at 8am on the 1st Monday of every month (except for holidays) when it is first come/first served. °  °  °[facility] °[phone] °  °Location: °[address], [place] °Clinic Hours: °Friday mornings °Services, Payment Options, Best way to get seen: °Call for info °

## 2019-07-12 NOTE — ED Notes (Signed)
See triage note   Presents with right sided dental pain    States pain radiates into right ear  Pain started about 1 week

## 2019-07-12 NOTE — ED Triage Notes (Signed)
PT c/o RT lower dental pain x1wk now radiating into her RT ear. Denies fever

## 2019-07-14 ENCOUNTER — Emergency Department
Admission: EM | Admit: 2019-07-14 | Discharge: 2019-07-14 | Disposition: A | Discharge status: Home / Self Care | Payer: Medicaid Other | Attending: Emergency Medicine | Admitting: Emergency Medicine

## 2019-07-14 ENCOUNTER — Other Ambulatory Visit: Payer: Self-pay

## 2019-07-14 ENCOUNTER — Ambulatory Visit: Payer: Self-pay | Admitting: *Deleted

## 2019-07-14 DIAGNOSIS — K0889 Other specified disorders of teeth and supporting structures: Secondary | ICD-10-CM | POA: Diagnosis not present

## 2019-07-14 DIAGNOSIS — Z79899 Other long term (current) drug therapy: Secondary | ICD-10-CM | POA: Diagnosis not present

## 2019-07-14 DIAGNOSIS — F17210 Nicotine dependence, cigarettes, uncomplicated: Secondary | ICD-10-CM | POA: Diagnosis not present

## 2019-07-14 MED ORDER — OXYCODONE-ACETAMINOPHEN 5-325 MG PO TABS
2.0000 | ORAL_TABLET | Freq: Once | ORAL | Status: AC
  Administered 2019-07-14: 16:00:00 2 via ORAL
  Filled 2019-07-14: qty 2

## 2019-07-14 MED ORDER — CLINDAMYCIN HCL 300 MG PO CAPS: 300.0000 mg | ORAL_CAPSULE | Freq: Three times a day (TID) | ORAL | 0 refills | Status: AC

## 2019-07-14 MED ORDER — HYDROCODONE-ACETAMINOPHEN 5-325 MG PO TABS: 1.0000 | ORAL_TABLET | Freq: Four times a day (QID) | ORAL | 0 refills | Status: AC | PRN

## 2019-07-14 NOTE — ED Notes (Signed)
Cari Beth at bedside. Pt to be discharged from triage

## 2019-07-14 NOTE — ED Provider Notes (Signed)
[facility] Emergency Department Provider Note ____________________________________________  Time seen: Approximately 4:05 PM  I have reviewed the triage vital signs and the nursing notes.   HISTORY  Chief Complaint Dental Pain   HPI Terri [name] is a 23 y.o. female presents emergency for treatment and evaluation of dental pain.  She was evaluated here 2 days ago and prescribed amoxicillin and tramadol.  She states that she has been compliant with her medications, however she continues to have intense lower jaw pain that is now radiating up into her right ear.   Past Medical History:  Diagnosis Date  . No pertinent past medical history     Patient Active Problem List   Diagnosis Date Noted  . GAD (generalized anxiety disorder) 01/16/2019  . Current moderate episode of major depressive disorder without prior episode (HCC) 01/16/2019  . PTSD (post-traumatic stress disorder) 01/16/2019  . Encounter for counseling regarding contraception 01/16/2019  . Tobacco use disorder 01/16/2019  . 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 CHOLECYSTECTOMY N/A 11/04/2018   Procedure: LAPAROSCOPIC CHOLECYSTECTOMY;  Surgeon: Henrene Dodge, MD;  Location: [facility] ORS;  Service: General;  Laterality: N/A;  . DILATION AND EVACUATION N/A 07/13/2017   Procedure: DILATATION AND EVACUATION;  Surgeon: Ward, Elenora Fender, MD;  Location: [facility] ORS;  Service: Gynecology;  Laterality: N/A;  . ERCP N/A 11/14/2018   Procedure: ENDOSCOPIC RETROGRADE CHOLANGIOPANCREATOGRAPHY (ERCP);  Surgeon: Midge Minium, MD;  Location: [facility] ENDOSCOPY;  Service: Endoscopy;  Laterality: N/A;    Prior to Admission medications   Medication Sig Start Date End Date Taking? Authorizing Provider  busPIRone (BUSPAR) 5 MG tablet Take 1 tablet (5 mg total) by mouth 3 (three) times daily. 03/02/19   Erasmo Downer, MD  citalopram (CELEXA) 20 MG tablet Take 1 tablet  (20 mg total) by mouth daily. 03/02/19   Bacigalupo, Marzella Schlein, MD  clindamycin (CLEOCIN) 300 MG capsule Take 1 capsule (300 mg total) by mouth 3 (three) times daily for 10 days. 07/14/19 07/24/19  Javaughn Opdahl, Rulon Eisenmenger B, FNP  HYDROcodone-acetaminophen (NORCO/VICODIN) 5-325 MG tablet Take 1 tablet by mouth every 6 (six) hours as needed for up to 3 days for severe pain. 07/14/19 07/17/19  Larina Lieurance, Kasandra Knudsen, FNP  traMADol (ULTRAM) 50 MG tablet Take 1 tablet (50 mg total) by mouth every 6 (six) hours as needed. 07/12/19   Faythe Ghee, PA-C    Allergies Sulfa antibiotics and Zithromax [azithromycin]  Family History  Problem Relation Age of Onset  . Arthritis Mother   . Thyroid disease Mother   . Depression Mother   . Anxiety disorder Mother   . Anxiety disorder Maternal Grandmother   . Depression Maternal Grandmother   . COPD Maternal Grandmother   . ALS Maternal Grandfather   . Breast cancer Neg Hx   . Colon cancer Neg Hx     Social History Social History   Tobacco Use  . Smoking status: Current Every Day Smoker    Packs/day: 0.50    Years: 8.00    Pack years: 4.00    Types: Cigarettes  . Smokeless tobacco: Never Used  Substance Use Topics  . Alcohol use: No  . Drug use: No    Review of Systems Constitutional: Negative for fever or recent illness. ENT: Positive for dental pain. Musculoskeletal: Negative for trismus of the jaw.  Skin: Negative for wound or lesion. ____________________________________________   PHYSICAL EXAM:  VITAL SIGNS:  Today's Vitals  07/14/19 1604 07/14/19 1605 07/14/19 1606  BP:   (!) 134/100  Pulse:   88  Resp:   18  Temp:   98.4 F (36.9 C)  SpO2:   100%  Weight:  68 kg   Height:  5\' 4"  (1.626 m)   PainSc: 10-Worst pain ever     Body mass index is 25.75 kg/m.                                                                Constitutional: Alert and oriented. Well appearing and in no acute distress. Eyes: Conjunctiva are  clear without discharge or drainage. Mouth/Throat: Widespread poor oral hygiene. Periodontal Exam    Hematological/Lymphatic/Immunilogical: No palpable adenopathy along the anterior cervical chain Respiratory: Respirations even and unlabored. Musculoskeletal: Full ROM of the jaw. Neurologic: Awake, alert, oriented.  Skin: No facial edema or erythema associated with dental pain Psychiatric: Affect and behavior intact.  ____________________________________________   LABS (all labs ordered are listed, but only abnormal results are displayed)  Labs Reviewed - No data to display ____________________________________________   RADIOLOGY  Not indicated. ____________________________________________   PROCEDURES  Procedure(s) performed:   Procedures  Critical Care performed: No ____________________________________________   INITIAL IMPRESSION / ASSESSMENT AND PLAN / ED COURSE  Terri [name] is a 23 y.o. female presenting to the emergency department for treatment and evaluation of dental pain.  Plan will be to have her stop the amoxicillin and start clindamycin.  She will also be given a few days of Norco.  Upon discharge, she will be provided a lengthy list of community dental resources and encouraged to call and schedule appointment as soon as possible.  She is to return to the emergency department for symptoms of change or worsen if she is unable to schedule an appointment.  Pertinent labs & imaging results that were available during my care of the patient were reviewed by me and considered in my medical decision making (see chart for details).  ____________________________________________   FINAL CLINICAL IMPRESSION(S) / ED DIAGNOSES  Final diagnoses:  Pain, dental    Discharge Medication List as of 07/14/2019  4:10 PM    START taking these medications   Details  clindamycin (CLEOCIN) 300 MG capsule Take 1 capsule (300 mg total) by mouth 3 (three) times daily  for 10 days., Starting Fri 07/14/2019, Until Mon 07/24/2019, Normal    HYDROcodone-acetaminophen (NORCO/VICODIN) 5-325 MG tablet Take 1 tablet by mouth every 6 (six) hours as needed for up to 3 days for severe pain., Starting Fri 07/14/2019, Until Mon 07/17/2019, Normal        If controlled substance prescribed during this visit, 12 month history viewed on the [facility] prior to issuing an initial prescription for Schedule II or III opiod.  Note:  This document was prepared using Dragon voice recognition software and may include unintentional dictation errors.   Victorino Dike, FNP 07/14/19 1707    Duffy Bruce, MD 07/15/19 [phone]

## 2019-07-14 NOTE — Telephone Encounter (Signed)
Patient was treated at ED for tooth pain- patient was given antibiotic and pain medication. Patient reports she is not any better and still in pain. Advised back to ED for follow up and possible help. Patient has not reached out to dentist on the list  Reason for Disposition . [1] SEVERE pain (e.g., excruciating, unable to do any normal activities) AND [2] not improved 2 hours after pain medicine  Answer Assessment - Initial Assessment Questions 1. LOCATION: "Which tooth is hurting?"  (e.g., right-side/left-side, upper/lower, front/back)     Right bottom side 2. ONSET: "When did the toothache start?"  (e.g., hours, days)      1 week 3. SEVERITY: "How bad is the toothache?"  (Scale 1-10; mild, moderate or severe)   - MILD (1-3): doesn't interfere with chewing    - MODERATE (4-7): interferes with chewing, interferes with normal activities, awakens from sleep     - SEVERE (8-10): unable to eat, unable to do any normal activities, excruciating pain        severe 4. SWELLING: "Is there any visible swelling of your face?"     yes 5. OTHER SYMPTOMS: "Do you have any other symptoms?" (e.g., fever)     Dizzy, ear pain, headache 6. PREGNANCY: "Is there any chance you are pregnant?" "When was your last menstrual period?"     no  Protocols used: TOOTHACHE-A-AH

## 2019-07-14 NOTE — Discharge Instructions (Signed)
Please call and schedule a dental appointment as soon as possible. You will need to be seen within the next 14 days. Return to the emergency department for symptoms that change or worsen if you're unable to schedule an appointment.  OPTIONS FOR DENTAL FOLLOW UP CARE  [place] Department of Health and Human Services - Local Safety Net Dental Clinics http://www.ncdhhs.gov/dph/oralhealth/services/safetynetclinics.htm   [facility] ([phone])  [facility] ([phone])  [facility] ([phone])  [facility] ([phone])  [facility] ([phone]) This clinic caters to the indigent population and is on a lottery system. Location: [facility], [address], [place] Clinic Hours: Wednesdays from 6pm - 9pm, patients seen by a lottery system. For dates, call or go to [email or web address] Services: Cleanings, fillings and simple extractions. Payment Options: DENTAL WORK IS FREE OF CHARGE. Bring proof of income or support. Best way to get seen: Arrive at 5:15 pm - this is a lottery, NOT first come/first serve, so arriving earlier will not increase your chances of being seen.     [facility] [phone] Select option 1 for emergencies   Location: [facility], [address], [place] Clinic Hours: No walk-ins accepted - call the day before to schedule an appointment. Check in times are 9:30 am and 1:30 pm. Services: Simple extractions, temporary fillings, pulpectomy/pulp debridement, uncomplicated abscess drainage. Payment Options: PAYMENT IS DUE AT THE TIME OF SERVICE.  Fee is usually $100-200, additional surgical procedures (e.g. abscess drainage) may be extra. Cash, checks, Visa/MasterCard accepted.  Can file Medicaid if patient is covered for dental - patient should call case worker to check. No  discount for [facility] patients. Best way to get seen: MUST call the day before and get onto the schedule. Can usually be seen the next 1-2 days. No walk-ins accepted.     [facility] [phone]   Location: [facility], [address], [place] Clinic Hours: M, W, Th, F 8am or 1:30pm, Tues 9a or 1:30 - first come/first served. Services: Simple extractions, temporary fillings, uncomplicated abscess drainage.  You do not need to be an [place] resident. Payment Options: PAYMENT IS DUE AT THE TIME OF SERVICE. Dental insurance, otherwise sliding scale - bring proof of income or support. Depending on income and treatment needed, cost is usually $50-200. Best way to get seen: Arrive early as it is first come/first served.     [facility] [phone]   Location: [address] Clinic Hours: Mon-Thu 8a-5p Services: Most basic dental services including extractions and fillings. Payment Options: PAYMENT IS DUE AT THE TIME OF SERVICE. Sliding scale, up to 50% off - bring proof if income or support. Medicaid with dental option accepted. Best way to get seen: Call to schedule an appointment, can usually be seen within 2 weeks OR they will try to see walk-ins - show up at 8a or 2p (you may have to wait).     [facility] [phone] [place]ID[place] ONLY   Location: [facility], [address], [place], [place] [address] Clinic Hours: By appointment only. Monday - Thursday 8am-5pm, Friday 8am-12pm Services: Cleanings, fillings, extractions. Payment Options: PAYMENT IS DUE AT THE TIME OF SERVICE. Cash, Visa or MasterCard. Sliding scale - $30 minimum per service. Best way to get seen: Come in to office, complete packet and make an appointment -   need proof of income or support monies for each household member and proof of [place]  residence. Usually takes about a month to get in.     [facility] [phone]   Location: [address] [place] Clinic Hours: Walk-in Urgent Care Dental Services are offered Monday-Friday mornings only. The numbers of emergencies accepted daily is limited to the number of providers available. Maximum 15 - Mondays, Wednesdays & Thursdays Maximum 10 - Tuesdays & Fridays Services: You do not need to be a [place] resident to be seen for a dental emergency. Emergencies are defined as pain, swelling, abnormal bleeding, or dental trauma. Walkins will receive x-rays if needed. NOTE: Dental cleaning is not an emergency. Payment Options: PAYMENT IS DUE AT THE TIME OF SERVICE. Minimum co-pay is $40.00 for uninsured patients. Minimum co-pay is $3.00 for Medicaid with dental coverage. Dental Insurance is accepted and must be presented at time of visit. Medicare does not cover dental. Forms of payment: Cash, credit card, checks. Best way to get seen: If not previously registered with the clinic, walk-in dental registration begins at 7:15 am and is on a first come/first serve basis. If previously registered with the clinic, call to make an appointment.     [facility] [phone] [place] RESIDENTS ONLY   Location: [address], [place], [place] Clinic Hours: Mon-Thu 10a-2p Services: Extractions only! Payment Options: FREE (donations accepted) - bring proof of income or support Best way to get seen: Call and schedule an appointment OR come at 8am on the 1st Monday of every month (except for holidays) when it is first come/first served.     [facility] [phone]   Location: [address], [place] Clinic Hours: Friday mornings Services, Payment Options, Best way to get seen: Call for info  

## 2019-07-14 NOTE — ED Triage Notes (Signed)
Pt comes via POV from home with c/o right bottom tooth pain that has been going on for over a week   Pt states she was here 2 days ago and prescribed medication. Pt states she has been taking medication with no relief.

## 2019-07-14 NOTE — ED Notes (Signed)
Pt verbalized discharge instructions and has no questions at this time. Pt ambulatory with steady gait. Pt has family taking her home.

## 2019-08-03 ENCOUNTER — Other Ambulatory Visit: Payer: Self-pay | Admitting: Family Medicine

## 2019-08-10 ENCOUNTER — Telehealth: Payer: Self-pay | Admitting: General Practice

## 2019-08-10 NOTE — Telephone Encounter (Signed)
Pt was tested Friday 08/04/2019, at the [facility], and there are no records indicating this.  Best contact: [phone]

## 2019-08-10 NOTE — Telephone Encounter (Signed)
[facility] contacted and spoke with [place]. Myriam Jacobson states that she is unable to locate a test for the patient from 08/04/19 at the [facility]. site.Myriam Jacobson states that the most recent test located was in July.Will reach out to [facility] site lead to see if patient is on the testing log for that day. Pt called and left message on voicemail to return call.

## 2019-08-11 NOTE — Telephone Encounter (Signed)
Spoke with [facility] site lead who confirms that the patient was not on the COVID testing log for 08/04/19. Pt called and left message on voicemail to return call.

## 2019-08-14 NOTE — Progress Notes (Signed)
  Unable to reach patient by phone and she is not available online for her video visit.  No-show for this appointment

## 2019-08-15 ENCOUNTER — Encounter (INDEPENDENT_AMBULATORY_CARE_PROVIDER_SITE_OTHER): Payer: Medicaid Other | Admitting: Family Medicine

## 2019-08-15 ENCOUNTER — Other Ambulatory Visit: Payer: Self-pay

## 2019-08-15 DIAGNOSIS — Z5329 Procedure and treatment not carried out because of patient's decision for other reasons: Secondary | ICD-10-CM

## 2019-09-13 ENCOUNTER — Telehealth: Payer: Self-pay

## 2019-09-13 ENCOUNTER — Other Ambulatory Visit: Payer: Self-pay

## 2019-09-13 ENCOUNTER — Encounter: Payer: Self-pay | Admitting: Family Medicine

## 2019-09-13 ENCOUNTER — Ambulatory Visit (INDEPENDENT_AMBULATORY_CARE_PROVIDER_SITE_OTHER): Payer: Medicaid Other | Admitting: Family Medicine

## 2019-09-13 VITALS — BP 117/78 | HR 94 | Temp 97.1°F | Resp 16 | Ht 63.0 in | Wt 238.0 lb

## 2019-09-13 DIAGNOSIS — Z3491 Encounter for supervision of normal pregnancy, unspecified, first trimester: Secondary | ICD-10-CM

## 2019-09-13 DIAGNOSIS — H5213 Myopia, bilateral: Secondary | ICD-10-CM | POA: Diagnosis not present

## 2019-09-13 DIAGNOSIS — F411 Generalized anxiety disorder: Secondary | ICD-10-CM

## 2019-09-13 DIAGNOSIS — M2560 Stiffness of unspecified joint, not elsewhere classified: Secondary | ICD-10-CM | POA: Diagnosis not present

## 2019-09-13 DIAGNOSIS — F17200 Nicotine dependence, unspecified, uncomplicated: Secondary | ICD-10-CM | POA: Diagnosis not present

## 2019-09-13 DIAGNOSIS — M2550 Pain in unspecified joint: Secondary | ICD-10-CM | POA: Insufficient documentation

## 2019-09-13 DIAGNOSIS — F331 Major depressive disorder, recurrent, moderate: Secondary | ICD-10-CM

## 2019-09-13 DIAGNOSIS — H47333 Pseudopapilledema of optic disc, bilateral: Secondary | ICD-10-CM

## 2019-09-13 MED ORDER — SERTRALINE HCL 100 MG PO TABS: 100.0000 mg | ORAL_TABLET | Freq: Every day | ORAL | 3 refills | Status: DC

## 2019-09-13 NOTE — Assessment & Plan Note (Signed)
Pregnancy confirmed with urine pregnancy in our office Dates are uncertain, so she likely needs dating ultrasound No red flag symptoms Encourage starting prenatal vitamin Smoking cessation as above Avoidance of alcohol Encouraged her to call and get set up with an OB/GYN of her choice given that she may be 8 or more weeks pregnant

## 2019-09-13 NOTE — Assessment & Plan Note (Signed)
Chronic and uncontrolled She has had some symptoms previously consistent with PTSD from her childhood trauma We have discussed the importance of therapy and the synergistic effects with medication She felt like Celexa helped with her depression some, but not her anxiety We will switch to Zoloft She will start with 50 mg daily for 1 week and then increase to 100 mg daily Also discussed the safety of SSRIs in pregnancy and the importance of controlling depression in pregnancy BuSpar has been discontinued as this was not helpful with anxiety Discussed possible side effects Discussed it can take 6 to 8 weeks to reach full efficacy Repeat PHQ-9 and GAD-7 at next visit Consider dose titration at next visit Follow-up in 6 weeks virtually

## 2019-09-13 NOTE — Patient Instructions (Signed)
Pregnancy and Smoking Smoking during pregnancy is unhealthy for you and your baby. Smoke from cigarettes, e-cigarettes, pipes, and cigars contains many chemicals that can cause cancer (carcinogens). These products also contain a stimulant drug (nicotine). When you smoke, harmful substances that you breathe in enter your bloodstream and can be passed on to your baby. This can affect your baby's development. If you are planning to become pregnant or have recently become pregnant, talk with your health care provider about quitting smoking. You have a much better chance of having a healthy pregnancy and a healthy baby if you do not smoke while you are pregnant. How does smoking affect me? Smoking increases your risk for many long-term (chronic) diseases. These diseases include cancer, lung diseases, and heart disease. Smoking during pregnancy increases your risk of:  Losing the pregnancy (miscarriage or stillbirth).  Giving birth too early (premature birth).  Pregnancy outside of the uterus (tubal pregnancy).  Problems with the placenta, which is the organ that provides the baby nourishment and oxygen. These problems may include: ? Attachment of the placenta over the opening of the uterus (placenta previa). ? Detachment of the placenta before the baby's birth (placental abruption).  Having your water break before labor begins. How does smoking affect my baby? Before birth Smoking during pregnancy:  Decreases blood flow and oxygen to your baby.  Increases your baby's risk of birth defects, such as heart defects.  Increases your baby's heart rate.  Slows your baby's growth in the uterus (intrauterine growth retardation). After birth Babies born to women who smoked during pregnancy may:  Have symptoms of nicotine withdrawal.  Be born with a cleft lip, cleft palate, or other facial deformities.  Be too small at birth.  Have a high risk of: ? Serious health problems or lifelong  disabilities. These may result in the long-term need for certain medicines, therapies, or other treatments. ? Sudden infant death syndrome (SIDS). Follow these instructions at home:   Do not use any products that contain nicotine or tobacco, such as cigarettes, e-cigarettes, and chewing tobacco. If you need help quitting, ask your health care provider.  Talk with your health care provider about support strategies to quit smoking. Some methods to consider include: ? Counseling (smoking cessation counseling). ? Psychotherapy. ? Acupuncture. ? Hypnosis. ? Telephone hotlines for people trying to quit.  Do not take nicotine supplements or medicine to help you quit smoking unless your health care provider tells you to do so.  Avoid secondhand smoke. Ask people who smoke to avoid smoking around you.  Identify people, places, things, and activities that make you want to smoke (triggers). Avoid them. Where to find more information Learn more about smoking during pregnancy and quitting smoking from:  March of Dimes: www.marchofdimes.org  U.S. Department of Health and Human Services: women.smokefree.gov  American Cancer Society: www.cancer.org  American Heart Association: www.heart.org  National Cancer Institute: www.cancer.gov For help to quit smoking:  National smoking cessation telephone hotline: 1-800-QUIT NOW (784-8669) Contact a health care provider if:  You are struggling to quit smoking.  You are a smoker and you become pregnant or plan to become pregnant.  You start smoking again after giving birth. Summary  Smoking during pregnancy is unhealthy for you and your baby.  Tobacco smoke contains harmful substances that can affect a baby's health and development.  Smoking increases the risk for serious problems, such as miscarriage, birth defects, or premature birth.  If you need help to quit smoking, talk to your health   care provider and ask about support strategies such as  counseling. This information is not intended to replace advice given to you by your health care provider. Make sure you discuss any questions you have with your health care provider. Document Revised: 03/17/2019 Document Reviewed: 03/17/2019 Elsevier Patient Education  2020 Elsevier Inc.  

## 2019-09-13 NOTE — Assessment & Plan Note (Signed)
As above for polyarthralgia Labs today and referral to rheumatology

## 2019-09-13 NOTE — Assessment & Plan Note (Signed)
Chronic and uncontrolled She has had some symptoms previously consistent with PTSD from her childhood trauma We have discussed the importance of therapy and the synergistic effects with medication She felt like Celexa helped with her depression some, but not her anxiety We will switch to Zoloft She will start with 50 mg daily for 1 week and then increase to 100 mg daily BuSpar has been discontinued as this was not helpful with anxiety Discussed possible side effects Discussed it can take 6 to 8 weeks to reach full efficacy Repeat PHQ-9 and GAD-7 at next visit Consider dose titration at next visit Follow-up in 6 weeks virtually

## 2019-09-13 NOTE — Telephone Encounter (Signed)
Copied from CRM [id number]. Topic: General - Inquiry >> Sep 13, 2019  2:23 PM Leary Roca wrote: Reason for CRM: patient is wanting to speak with Dr Beryle Flock regarding an eye exam she recently went to today. Please advise

## 2019-09-13 NOTE — Progress Notes (Signed)
Patient: Terri [name] Female    DOB: 19-Feb-1996   23 y.o.   MRN: [id number] Visit Date: 09/13/2019  Today's Provider: Lavon Paganini, MD   Chief Complaint  Patient presents with  . Depression   Subjective:    [name], [name], am acting as scribe for Lavon Paganini, MD.  HPI Patient reports she is pregnant, patient reports she did a home pregnancy test. Patient reports she does not recall when her last menstrual period started.    Depression, Follow-up  She  was last seen for this 6 months ago. Changes made at last visit include patient to continue taking Celexa 20 mg daily and Buspar 5 mg TID added.   She reports poor compliance with treatment. Patient reports she stopped medication about a month ago, because it felt like it wasn't working.  She was getting tired again.  This was before she realized she was pregnant.  Buspar was not helping at all. She is not having side effects.   She reports poor tolerance of treatment. Current symptoms include: depressed mood, difficulty concentrating, fatigue, feelings of worthlessness/guilt, hopelessness, hypersomnia and weight gain She feels she is Worse since last visit.   Episode of polyarthralgia x 58yrWorse in the last wk Reports that last week it was difficult to even stand from sitting Involves ankles, knees, wrists, fingers, elbows, hips +Swelling and redness in R knee - somewhat better now She was told when this started in high school that she had RA and SLE, but then saw a specialist (?rheum) who told her this wasn't true She is not sure what she has  Morning stiffness of joints x30 min  Has had a positive home pregnancy test.  She believes that her LMP was sometime in November 2020, but she is not sure.  [place] and believes that she will likely see them again.  She has not started taking any prenatal vitamins.  She is trying to cut back  on smoking.  She is down to less than half a pack per day.  She is not using any nicotine replacement therapy.  She was able to quit during both of her other previous pregnancies.  Denies any vaginal bleeding, loss of fluid, abdominal pain. ------------------------------------------------------------------------ [facility] 09[facility]13[facility]2019 03/02/2019 01/16/2019  Decreased Interest 3 1 3   Down, Depressed, Hopeless 2 0 3  PHQ - 2 Score 5 1 6   Altered sleeping 3 1 2   Tired, decreased energy 3 1 2   Change in appetite 3 0 3  Feeling bad or failure about yourself  3 0 3  Trouble concentrating 3 1 2   Moving slowly or fidgety/restless 0 0 0  Suicidal thoughts 0 0 1  PHQ-9 Score 20 4 19   Difficult doing work/chores Extremely dIfficult Not difficult at all Very difficult      Allergies  Allergen Reactions  . Sulfa Antibiotics Other (See Comments)    Reaction: unknown.  Told allergic as child but believes she's taken since.  .Marland KitchenZithromax [Azithromycin] Other (See Comments)    Reaction: unknown Told reaction as a child    No current outpatient medications on file.  Review of Systems  Constitutional: Positive for activity change, appetite change and fatigue.  Respiratory: Negative.   Cardiovascular: Negative.   Musculoskeletal: Positive for arthralgias and myalgias.  Psychiatric/Behavioral: Positive for confusion, decreased concentration and sleep disturbance. The patient is nervous/anxious.  Social History   Tobacco Use  . Smoking status: Current Every Day Smoker    Packs/day: 0.50    Years: 8.00    Pack years: 4.00    Types: Cigarettes  . Smokeless tobacco: Never Used  Substance Use Topics  . Alcohol use: No      Objective:   BP 117/78 (BP Location: Left Arm, Patient Position: Sitting, Cuff Size: Large)   Pulse 94   Temp (!) 97.1 F (36.2 C) (Temporal)   Resp 16   Ht 5' 3"  (1.6 m)   Wt 238 lb (108 kg)   LMP 06/18/2019   BMI 42.16 kg/m  Vitals:   09/13/19  0812  BP: 117/78  Pulse: 94  Resp: 16  Temp: (!) 97.1 F (36.2 C)  TempSrc: Temporal  Weight: 238 lb (108 kg)  Height: 5' 3"  (1.6 m)  Body mass index is 42.16 kg/m.   Physical Exam Vitals reviewed.  Constitutional:      General: She is not in acute distress.    Appearance: Normal appearance. She is not diaphoretic.  HENT:     Head: Normocephalic and atraumatic.  Eyes:     General: No scleral icterus.    Conjunctiva/sclera: Conjunctivae normal.  Cardiovascular:     Rate and Rhythm: Normal rate and regular rhythm.     Pulses: Normal pulses.     Heart sounds: Normal heart sounds. No murmur.  Pulmonary:     Effort: Pulmonary effort is normal. No respiratory distress.     Breath sounds: Normal breath sounds. No wheezing.  Abdominal:     General: There is no distension.     Palpations: Abdomen is soft.     Tenderness: There is no abdominal tenderness.  Musculoskeletal:     Cervical back: Neck supple.     Right lower leg: No edema.     Left lower leg: No edema.     Comments: Fingers on bilateral hands appear slightly swollen and her engagement ring appears tight.  No obvious synovitis in her hands.  She does have a positive Finkelstein's of her left wrist.  Bilateral knees diffusely tender to palpation.  Right knee with small effusion palpable.  Range of motion is intact in all of her joints.  Gait is intact.  Lymphadenopathy:     Cervical: No cervical adenopathy.  Skin:    General: Skin is warm and dry.     Capillary Refill: Capillary refill takes less than 2 seconds.     Findings: No rash.  Neurological:     Mental Status: She is alert and oriented to person, place, and time. Mental status is at baseline.      No results found for any visits on 09/13/19.     Assessment & Plan    Problem List Items Addressed This Visit      Other   GAD (generalized anxiety disorder) - Primary    Chronic and uncontrolled She has had some symptoms previously consistent with PTSD  from her childhood trauma We have discussed the importance of therapy and the synergistic effects with medication She felt like Celexa helped with her depression some, but not her anxiety We will switch to Zoloft She will start with 50 mg daily for 1 week and then increase to 100 mg daily BuSpar has been discontinued as this was not helpful with anxiety Discussed possible side effects Discussed it can take 6 to 8 weeks to reach full efficacy Repeat PHQ-9 and GAD-7 at next visit Consider  dose titration at next visit Follow-up in 6 weeks virtually      Relevant Medications   sertraline (ZOLOFT) 100 MG tablet   Other Relevant Orders   TSH   CMP (Comprehensive metabolic panel)   CBC   Current moderate episode of major depressive disorder without prior episode (HCC)    Chronic and uncontrolled She has had some symptoms previously consistent with PTSD from her childhood trauma We have discussed the importance of therapy and the synergistic effects with medication She felt like Celexa helped with her depression some, but not her anxiety We will switch to Zoloft She will start with 50 mg daily for 1 week and then increase to 100 mg daily Also discussed the safety of SSRIs in pregnancy and the importance of controlling depression in pregnancy BuSpar has been discontinued as this was not helpful with anxiety Discussed possible side effects Discussed it can take 6 to 8 weeks to reach full efficacy Repeat PHQ-9 and GAD-7 at next visit Consider dose titration at next visit Follow-up in 6 weeks virtually      Relevant Medications   sertraline (ZOLOFT) 100 MG tablet   Tobacco use disorder    3 to 5-minute discussion regarding the health risks of continued smoking, benefits of cessation, and options to help with cessation We have discussed the risks during pregnancy and the benefits of cessation during pregnancy Patient not interested in patches or other nicotine replacement therapy at this  time Discussed quit line and info given      Polyarthralgia    Longstanding issue, but newly discussed with me Seems like her possible diagnoses of lupus and rheumatoid arthritis as a child or not based on any lab work but merely a clinical suspicion She does have some signs of 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 and also an effusion of one of her knees Her polyarthralgia and joint stiffness is concerning for possible rheumatologic condition We will start with labs, including CCP, rheumatoid factor, ESR, CRP, ANA Referral to rheumatology for further evaluation and management      Relevant Orders   Sed Rate (ESR)   C-reactive protein   ANA   Rheumatoid Factor   CYCLIC CITRUL PEPTIDE ANTIBODY, IGG/IGA   Ambulatory referral to Rheumatology   TSH   CMP (Comprehensive metabolic panel)   CBC   Joint stiffness    As above for polyarthralgia Labs today and referral to rheumatology      Relevant Orders   Sed Rate (ESR)   C-reactive protein   ANA   Rheumatoid Factor   CYCLIC CITRUL PEPTIDE ANTIBODY, IGG/IGA   Ambulatory referral to Rheumatology   TSH   CMP (Comprehensive metabolic panel)   CBC   First trimester pregnancy    Pregnancy confirmed with urine pregnancy in our office Dates are uncertain, so she likely needs dating ultrasound No red flag symptoms Encourage starting prenatal vitamin Smoking cessation as above Avoidance of alcohol Encouraged her to call and get set up with an OB/GYN of her choice given that she may be 8 or more weeks pregnant      Relevant Orders   TSH   CMP (Comprehensive metabolic panel)   CBC       Return in about 6 weeks (around 10/25/2019) for MDD/GAD f/u.   The entirety of the information documented in the History of Present Illness, Review of Systems and Physical Exam were personally obtained by me. Portions of this information were initially documented by Lynford Humphrey, [name] and reviewed by me  for thoroughness and accuracy.    Shandiin Eisenbeis,  Dionne Bucy, MD MPH [facility]

## 2019-09-13 NOTE — Assessment & Plan Note (Signed)
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

## 2019-09-13 NOTE — Assessment & Plan Note (Signed)
3 to 5-minute discussion regarding the health risks of continued smoking, benefits of cessation, and options to help with cessation We have discussed the risks during pregnancy and the benefits of cessation during pregnancy Patient not interested in patches or other nicotine replacement therapy at this time Discussed quit line and info given

## 2019-09-14 ENCOUNTER — Other Ambulatory Visit (INDEPENDENT_AMBULATORY_CARE_PROVIDER_SITE_OTHER): Payer: Medicaid Other | Admitting: Family Medicine

## 2019-09-14 DIAGNOSIS — Z3491 Encounter for supervision of normal pregnancy, unspecified, first trimester: Secondary | ICD-10-CM

## 2019-09-14 NOTE — Telephone Encounter (Signed)
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  It is reassuring that it is mild and bilateral.  She needs referral to Ophthalmology and Neurology for further evaluation.  Labs have not all resulted yet.  I am waiting on some to come back.  The CRP could signify something with her joint, but these inflammatory markers can also be elevated in Pregnancy.  Will review them for her when they are all available.

## 2019-09-14 NOTE — Telephone Encounter (Signed)
Patient calling back regarding. Patient requesting call back from [name].

## 2019-09-14 NOTE — Telephone Encounter (Signed)
Patient advised and verbally voiced understanding. Please schedule referral appointments.

## 2019-09-14 NOTE — Addendum Note (Signed)
Addended by: Benjiman Core on: 09/14/2019 03:38 PM   Modules accepted: Orders

## 2019-09-14 NOTE — Telephone Encounter (Signed)
Patient called to check to make sure we received the eye doctors office visit notes from yesterday. She states she talked to their office today and they told her they would reach out to our office and send [place] a copy of the visit. Patient is very worried because she was told by the eye doctor that she had swelling of the optic nerve. Our medical records team has not seen anything come through for this patient, so I called the eye doctors office [facility]- [phone]) and requested that they fax a copy of yesterdays office note to [place]. Awaiting report.   Also, patient saw her lab results on mychart and is very concerned about the results and an elevated CRP. Results have not been reviewed by provider, so I didn't advise her of any results.

## 2019-09-15 LAB — SEDIMENTATION RATE: Sed Rate: 22 mm/hr (ref 0–32)

## 2019-09-15 LAB — C-REACTIVE PROTEIN: CRP: 27 mg/L — ABNORMAL HIGH (ref 0–10)

## 2019-09-15 LAB — COMPREHENSIVE METABOLIC PANEL
ALT: 7 IU/L (ref 0–32)
AST: 8 IU/L (ref 0–40)
Albumin/Globulin Ratio: 1.4 (ref 1.2–2.2)
Albumin: 3.9 g/dL (ref 3.9–5.0)
Alkaline Phosphatase: 71 IU/L (ref 39–117)
BUN/Creatinine Ratio: 11 (ref 9–23)
BUN: 5 mg/dL — ABNORMAL LOW (ref 6–20)
Bilirubin Total: 0.2 mg/dL (ref 0.0–1.2)
CO2: 19 mmol/L — ABNORMAL LOW (ref 20–29)
Calcium: 8.8 mg/dL (ref 8.7–10.2)
Chloride: 103 mmol/L (ref 96–106)
Creatinine, Ser: 0.47 mg/dL — ABNORMAL LOW (ref 0.57–1.00)
GFR calc Af Amer: 159 mL/min/{1.73_m2} (ref >59)
GFR calc non Af Amer: 138 mL/min/{1.73_m2} (ref >59)
Globulin, Total: 2.7 g/dL (ref 1.5–4.5)
Glucose: 96 mg/dL (ref 65–99)
Potassium: 4 mmol/L (ref 3.5–5.2)
Sodium: 137 mmol/L (ref 134–144)
Total Protein: 6.6 g/dL (ref 6.0–8.5)

## 2019-09-15 LAB — ANA: ANA Titer 1: NEGATIVE

## 2019-09-15 LAB — CBC
Hematocrit: 37.5 % (ref 34.0–46.6)
Hemoglobin: 13.1 g/dL (ref 11.1–15.9)
MCH: 29 pg (ref 26.6–33.0)
MCHC: 34.9 g/dL (ref 31.5–35.7)
MCV: 83 fL (ref 79–97)
Platelets: 205 10*3/uL (ref 150–450)
RBC: 4.51 x10E6/uL (ref 3.77–5.28)
RDW: 13.2 % (ref 11.7–15.4)
WBC: 10.5 10*3/uL (ref 3.4–10.8)

## 2019-09-15 LAB — RHEUMATOID FACTOR: Rheumatoid fact SerPl-aCnc: <10 IU/mL (ref 0.0–13.9)

## 2019-09-15 LAB — CYCLIC CITRUL PEPTIDE ANTIBODY, IGG/IGA: Cyclic Citrullin Peptide Ab: 6 units (ref 0–19)

## 2019-09-15 LAB — TSH: TSH: 1.03 u[IU]/mL (ref 0.450–4.500)

## 2019-09-18 ENCOUNTER — Telehealth: Payer: Self-pay

## 2019-09-18 NOTE — Telephone Encounter (Signed)
Patient advised as below.  

## 2019-09-18 NOTE — Telephone Encounter (Signed)
-----   Message from Erasmo Downer, MD sent at 09/18/2019 12:49 PM EST ----- Normal labs, except CRP is elevated.  This is a nonspecific lab and can be elevated in pregnancy or any inflammatory condition.  Recommend going ahead with Rheum as placed at last visit.

## 2019-09-20 ENCOUNTER — Encounter: Payer: Self-pay | Admitting: Family Medicine

## 2019-09-20 ENCOUNTER — Telehealth: Payer: Self-pay

## 2019-09-20 NOTE — Telephone Encounter (Signed)
Pt scheduled for telephone visit

## 2019-09-20 NOTE — Progress Notes (Signed)
Patient: Terri [name] Female    DOB: 12-Jun-1996   23 y.o.   MRN: [id number] Visit Date: 09/21/2019  Today's Provider: Shirlee Latch, MD   Chief Complaint  Patient presents with  . Headache   Subjective:    Virtual Visit via Telephone Note  I connected with Terri [name] on 09/21/19 at  9:40 AM EST by telephone and verified that I am speaking with the correct person using two identifiers.  Location: Patient location: home Provider location: [facility] Persons involved in the visit: patient, provider   I discussed the limitations, risks, security and privacy concerns of performing an evaluation and management service by telephone and the availability of in person appointments. I also discussed with the patient that there may be a patient responsible charge related to this service. The patient expressed understanding and agreed to proceed.  Headache  This is a new problem. The current episode started in the past 7 days. The problem occurs constantly. The problem has been gradually worsening. The pain is located in the frontal and occipital region. The pain does not radiate. The pain quality is not similar to prior headaches. The quality of the pain is described as squeezing, band-like, stabbing and throbbing. The pain is at a severity of 8/10. The pain is moderate. Associated symptoms include nausea and neck pain. Pertinent negatives include no abdominal pain, blurred vision, dizziness, fever, hearing loss, loss of balance, numbness, sinus pressure, sore throat, visual change or weakness. The symptoms are aggravated by bright light and activity. She has tried acetaminophen for the symptoms. The treatment provided no relief. Her past medical history is significant for migraines in the family. There is no history of hypertension, migraine headaches, recent head traumas or sinus disease.   No other covid symptoms  Allergies  Allergen Reactions  . Sulfa  Antibiotics Other (See Comments)    Reaction: unknown.  Told allergic as child but believes she's taken since.  Marland Kitchen Zithromax [Azithromycin] Other (See Comments)    Reaction: unknown Told reaction as a child     Current Outpatient Medications:  .  sertraline (ZOLOFT) 100 MG tablet, Take 1 tablet (100 mg total) by mouth daily., Disp: 30 tablet, Rfl: 3 .  butalbital-acetaminophen-caffeine (FIORICET) 50-325-40 MG tablet, Take 1-2 tablets by mouth every 6 (six) hours as needed for headache., Disp: 30 tablet, Rfl: 1 .  promethazine (PHENERGAN) 25 MG tablet, Take 0.5-1 tablets (12.5-25 mg total) by mouth every 8 (eight) hours as needed for nausea or vomiting., Disp: 20 tablet, Rfl: 1  Review of Systems  Constitutional: Negative for fever.  HENT: Negative for hearing loss, sinus pressure and sore throat.   Eyes: Negative for blurred vision.  Respiratory: Negative.   Cardiovascular: Negative.   Gastrointestinal: Positive for nausea. Negative for abdominal pain.  Musculoskeletal: Positive for neck pain.  Neurological: Positive for headaches. Negative for dizziness, weakness, numbness and loss of balance.    Social History   Tobacco Use  . Smoking status: Current Every Day Smoker    Packs/day: 0.50    Years: 8.00    Pack years: 4.00    Types: Cigarettes  . Smokeless tobacco: Never Used  Substance Use Topics  . Alcohol use: No      Objective:   Ht 5\' 3"  (1.6 m)   Wt 238 lb (108 kg)   LMP 06/18/2019   BMI 42.16 kg/m  Vitals:   09/20/19 1641  Weight: 238 lb (108 kg)  Height:  5\' 3"  (1.6 m)  Body mass index is 42.16 kg/m.   Physical Exam Speaking in full sentences in NAD  No results found for any visits on 09/21/19.     Assessment & Plan    I discussed the assessment and treatment plan with the patient. The patient was provided an opportunity to ask questions and all were answered. The patient agreed with the plan and demonstrated an understanding of the instructions.     The patient was advised to call back or seek an in-person evaluation if the symptoms worsen or if the condition fails to improve as anticipated.  1. 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 butalbital-acetaminophen-caffeine (FIORICET) 50-325-40 MG tablet    Sig: Take 1-2 tablets by mouth every 6 (six) hours as needed for headache.    Dispense:  30 tablet    Refill:  1  . promethazine (PHENERGAN) 25 MG tablet    Sig: Take 0.5-1 tablets (12.5-25 mg total) by mouth every 8 (eight) hours as needed for nausea or vomiting.    Dispense:  20 tablet    Refill:  1     Return if symptoms worsen or fail to improve.   The entirety of the information documented in the History of Present Illness, Review of Systems and Physical Exam were personally obtained by me. Portions of this information were initially documented by Lynford Humphrey, [name] and reviewed by me for thoroughness and accuracy.    Ora Bollig, Dionne Bucy, MD MPH [facility]

## 2019-09-21 ENCOUNTER — Ambulatory Visit (INDEPENDENT_AMBULATORY_CARE_PROVIDER_SITE_OTHER): Payer: Medicaid Other | Admitting: Family Medicine

## 2019-09-21 VITALS — Ht 63.0 in | Wt 238.0 lb

## 2019-09-21 DIAGNOSIS — G43011 Migraine without aura, intractable, with status migrainosus: Secondary | ICD-10-CM

## 2019-09-21 MED ORDER — PROMETHAZINE HCL 25 MG PO TABS: 12.5000 mg | ORAL_TABLET | Freq: Three times a day (TID) | ORAL | 1 refills | Status: DC | PRN

## 2019-09-21 MED ORDER — BUTALBITAL-APAP-CAFFEINE 50-325-40 MG PO TABS: 1.0000 | ORAL_TABLET | Freq: Four times a day (QID) | ORAL | 1 refills | Status: DC | PRN

## 2019-09-21 NOTE — Patient Instructions (Signed)

## 2019-09-28 ENCOUNTER — Other Ambulatory Visit: Payer: Self-pay | Admitting: Advanced Practice Midwife

## 2019-09-28 ENCOUNTER — Telehealth: Payer: Self-pay

## 2019-09-28 DIAGNOSIS — Z3480 Encounter for supervision of other normal pregnancy, unspecified trimester: Secondary | ICD-10-CM

## 2019-09-28 DIAGNOSIS — Z3491 Encounter for supervision of normal pregnancy, unspecified, first trimester: Secondary | ICD-10-CM

## 2019-09-28 NOTE — Telephone Encounter (Signed)
Pt has her NOB scheduled for 10/16/19 she has just recently found out she has a brain tumor and her neurologist needs to know how far along she is to start treatment, needs someone to schedule a u/s to let the Neurologist know. Can you out in order?

## 2019-09-28 NOTE — Telephone Encounter (Signed)
Spoke with patient on the phone she said she was going to [facility] for OB care but would like to change to [facility] to continue OB care. KW

## 2019-09-28 NOTE — Telephone Encounter (Signed)
Copied from CRM [id number]. Topic: Referral - Request for Referral >> Sep 28, 2019  3:27 PM Floria Raveling A wrote: Has patient seen PCP for this complaint? No. *If NO, is insurance requiring patient see PCP for this issue before PCP can refer them? Referral for which specialty: GYN  Preferred provider/office: [facility] Reason for referral: Dr Malvin Johns office at Neuro called in and stated that the pt need a Stat referral to a GYN, pt has medicaid.  They would like to see if she can get into [facility] / pt is is 15 week preg

## 2019-09-28 NOTE — Telephone Encounter (Signed)
OK to place referral for [facility]

## 2019-09-28 NOTE — Telephone Encounter (Signed)
I thought that she had an OB set up for this pregnancy?  Can we confirm with patient that she is wantign to go to [facility] and then we can place referral.

## 2019-09-28 NOTE — Telephone Encounter (Signed)
You may want to touch base with her. She appears to be switching to [facility]

## 2019-09-28 NOTE — Progress Notes (Unsigned)
Order placed for dating scan.

## 2019-09-28 NOTE — Telephone Encounter (Signed)
I placed the order for the dating scan.

## 2019-09-28 NOTE — Telephone Encounter (Signed)
Only u/s we have today is 430 so please advise if she can come in today

## 2019-09-28 NOTE — Telephone Encounter (Signed)
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 Thank you

## 2019-09-29 LAB — POCT URINE PREGNANCY: Preg Test, Ur: POSITIVE — AB

## 2019-09-29 NOTE — Telephone Encounter (Signed)
I saw that information yesterday. I tried my best, sorry. I did try to call her this morning but mailbox is full so unable to leave VM.

## 2019-09-29 NOTE — Telephone Encounter (Signed)
Finally got ahold of pt. All of her specialty doctors are with [facility] so that is why she is transferring. Cancelled NOB she had with [place].

## 2019-09-29 NOTE — Telephone Encounter (Signed)
Error

## 2019-10-05 ENCOUNTER — Other Ambulatory Visit: Payer: Self-pay

## 2019-10-05 ENCOUNTER — Other Ambulatory Visit (HOSPITAL_COMMUNITY): Payer: Self-pay

## 2019-10-05 ENCOUNTER — Ambulatory Visit: Admission: RE | Admit: 2019-10-05 | Payer: Medicaid Other | Source: Ambulatory Visit

## 2019-10-05 ENCOUNTER — Ambulatory Visit
Admission: RE | Admit: 2019-10-05 | Discharge: 2019-10-05 | Disposition: A | Discharge status: Home / Self Care | Payer: Medicaid Other | Source: Ambulatory Visit

## 2019-10-05 DIAGNOSIS — Z3687 Encounter for antenatal screening for uncertain dates: Secondary | ICD-10-CM

## 2019-10-05 DIAGNOSIS — O09899 Supervision of other high risk pregnancies, unspecified trimester: Secondary | ICD-10-CM | POA: Insufficient documentation

## 2019-10-05 DIAGNOSIS — Z113 Encounter for screening for infections with a predominantly sexual mode of transmission: Secondary | ICD-10-CM | POA: Diagnosis not present

## 2019-10-05 DIAGNOSIS — Z6841 Body Mass Index (BMI) 40.0 and over, adult: Secondary | ICD-10-CM | POA: Diagnosis not present

## 2019-10-05 DIAGNOSIS — O208 Other hemorrhage in early pregnancy: Secondary | ICD-10-CM | POA: Diagnosis not present

## 2019-10-05 DIAGNOSIS — Z124 Encounter for screening for malignant neoplasm of cervix: Secondary | ICD-10-CM | POA: Diagnosis not present

## 2019-10-05 DIAGNOSIS — Z3A10 10 weeks gestation of pregnancy: Secondary | ICD-10-CM | POA: Diagnosis not present

## 2019-10-05 DIAGNOSIS — O99211 Obesity complicating pregnancy, first trimester: Secondary | ICD-10-CM | POA: Diagnosis not present

## 2019-10-05 DIAGNOSIS — Z3481 Encounter for supervision of other normal pregnancy, first trimester: Secondary | ICD-10-CM | POA: Diagnosis not present

## 2019-10-10 ENCOUNTER — Other Ambulatory Visit: Payer: Self-pay

## 2019-10-10 DIAGNOSIS — Z369 Encounter for antenatal screening, unspecified: Secondary | ICD-10-CM

## 2019-10-12 ENCOUNTER — Encounter: Payer: Self-pay | Admitting: Family Medicine

## 2019-10-16 ENCOUNTER — Encounter: Payer: Medicaid Other | Admitting: Obstetrics & Gynecology

## 2019-10-16 DIAGNOSIS — R519 Headache, unspecified: Secondary | ICD-10-CM | POA: Diagnosis not present

## 2019-10-16 DIAGNOSIS — H538 Other visual disturbances: Secondary | ICD-10-CM | POA: Diagnosis not present

## 2019-10-16 DIAGNOSIS — H579 Unspecified disorder of eye and adnexa: Secondary | ICD-10-CM | POA: Diagnosis not present

## 2019-10-17 DIAGNOSIS — R519 Headache, unspecified: Secondary | ICD-10-CM | POA: Diagnosis not present

## 2019-10-19 ENCOUNTER — Ambulatory Visit: Payer: Medicaid Other

## 2019-10-26 ENCOUNTER — Ambulatory Visit (INDEPENDENT_AMBULATORY_CARE_PROVIDER_SITE_OTHER)
Admission: RE | Admit: 2019-10-26 | Discharge: 2019-10-26 | Disposition: A | Discharge status: Home / Self Care | Payer: Medicaid Other | Source: Ambulatory Visit

## 2019-10-26 ENCOUNTER — Telehealth: Payer: Medicaid Other

## 2019-10-26 ENCOUNTER — Other Ambulatory Visit: Payer: Self-pay

## 2019-10-26 DIAGNOSIS — K0889 Other specified disorders of teeth and supporting structures: Secondary | ICD-10-CM

## 2019-10-26 MED ORDER — AMOXICILLIN 500 MG PO TABS: 500.0000 mg | ORAL_TABLET | Freq: Three times a day (TID) | ORAL | 0 refills | Status: DC

## 2019-10-26 MED ORDER — IBUPROFEN 800 MG PO TABS: 800.0000 mg | ORAL_TABLET | Freq: Three times a day (TID) | ORAL | 0 refills | Status: DC | PRN

## 2019-10-26 NOTE — Discharge Instructions (Signed)
Take the antibiotic as prescribed.  Take the ibuprofen as needed for discomfort.  A dental resource guide is attached.  Please call to make an appointment with a dentist as soon as possible.    Come here or go to the emergency department if you develop increased pain, swelling, fever, chills, or other concerning symptoms.

## 2019-10-26 NOTE — ED Provider Notes (Signed)
Virtual Visit via Video Note:  Terri [name]  initiated request for Telemedicine visit with [facility] team. I connected with Terri [name]  on 10/26/2019 at 5:39 PM  for a synchronized telemedicine visit using a video enabled HIPPA compliant telemedicine application. I verified that I am speaking with Terri [name]  using two identifiers. Mickie Bail, NP  was physically located in a [facility] Urgent care site and Terri [name] was located at a different location.   The limitations of evaluation and management by telemedicine as well as the availability of in-person appointments were discussed. Patient was informed that she  may incur a bill ( including co-pay) for this virtual visit encounter. Terri [name]  expressed understanding and gave verbal consent to proceed with virtual visit.     History of Present Illness:Terri [name]  is a 24 y.o. female presents for evaluation of broken tooth 2 years ago; pain worse since this morning.  The broken tooth is in the upper front.  She denies fever, chills, difficulty swallowing, difficulty breathing, or other symptoms.  Treating pain at home with Orajel and ibuprofen with temporary relief.   She denies current pregnancy or breastfeeding.     Allergies  Allergen Reactions  . Sulfa Antibiotics Other (See Comments)    Reaction: unknown.  Told allergic as child but believes she's taken since.  Marland Kitchen Zithromax [Azithromycin] Other (See Comments)    Reaction: unknown Told reaction as a child     Past Medical History:  Diagnosis Date  . No pertinent past medical history      Social History   Tobacco Use  . Smoking status: Current Every Day Smoker    Packs/day: 0.50    Years: 8.00    Pack years: 4.00    Types: Cigarettes  . Smokeless tobacco: Never Used  Substance Use Topics  . Alcohol use: No  . Drug use: No    ROS: as stated in HPI.  All other systems reviewed and negative.       Observations/Objective: Physical Exam  VITALS: Patient denies fever. GENERAL: Alert, appears well and in no acute distress. HEENT: Broken upper front tooth.   NECK: Normal movements of the head and neck. CARDIOPULMONARY: No increased WOB. Speaking in clear sentences. I:E ratio WNL.  MS: Moves all visible extremities without noticeable abnormality. PSYCH: Pleasant and cooperative, well-groomed. Speech normal rate and rhythm. Affect is appropriate. Insight and judgement are appropriate. Attention is focused, linear, and appropriate.  NEURO: CN grossly intact. Oriented as arrived to appointment on time with no prompting. Moves both UE equally.  SKIN: No obvious lesions, wounds, erythema, or cyanosis noted on face or hands.   Assessment and Plan:    ICD-10-CM   1. Tooth ache  K08.89        Follow Up Instructions: Treating with amoxicillin and ibuprofen.  Instructed patient to follow-up with a dentist; dental resource guide provided.  Instructed her to come here to be seen in person or go to the emergency department if she has increased pain or develop swelling, fever, chills, or other concerning symptoms.  Patient agrees to plan of care.      I discussed the assessment and treatment plan with the patient. The patient was provided an opportunity to ask questions and all were answered. The patient agreed with the plan and demonstrated an understanding of the instructions.   The patient was advised to call back or seek an in-person evaluation  if the symptoms worsen or if the condition fails to improve as anticipated.      Sharion Balloon, NP  10/26/2019 5:39 PM         Sharion Balloon, NP 10/26/19 1740

## 2019-10-30 ENCOUNTER — Encounter: Payer: Self-pay | Admitting: Family Medicine

## 2019-10-30 ENCOUNTER — Telehealth (INDEPENDENT_AMBULATORY_CARE_PROVIDER_SITE_OTHER): Payer: Medicaid Other | Admitting: Family Medicine

## 2019-10-30 DIAGNOSIS — R519 Headache, unspecified: Secondary | ICD-10-CM

## 2019-10-30 DIAGNOSIS — F411 Generalized anxiety disorder: Secondary | ICD-10-CM | POA: Diagnosis not present

## 2019-10-30 DIAGNOSIS — F321 Major depressive disorder, single episode, moderate: Secondary | ICD-10-CM | POA: Diagnosis not present

## 2019-10-30 DIAGNOSIS — K0889 Other specified disorders of teeth and supporting structures: Secondary | ICD-10-CM

## 2019-10-30 MED ORDER — TRAMADOL HCL 50 MG PO TABS: 50.0000 mg | ORAL_TABLET | Freq: Two times a day (BID) | ORAL | 0 refills | Status: AC | PRN

## 2019-10-30 NOTE — Assessment & Plan Note (Signed)
Followed by Dr Malvin Johns with Neurology Continue nortriptyline Much improved

## 2019-10-30 NOTE — Progress Notes (Signed)
Patient: Terri [name] Female    DOB: 1996/07/03   23 y.o.   MRN: [id number] Visit Date: 10/30/2019  Today's Provider: Lavon Paganini, MD   Chief Complaint  Patient presents with  . Depression   Subjective:    Terri [name], [name], am acting as scribe for Terri Paganini, MD.  HPI  Depression, Follow-up  She  was last seen for this 6 weeks ago. Changes made at last visit include. Starting zoloft 50mg  for 1 week then 100 mg daily.   She reports excellent compliance with treatment. She is not having side effects.   She reports excellent tolerance of treatment. Current symptoms include: patient denies any symptoms She feels she is Improved since last visit.  Depression screen [facility] 2/9 10/30/2019 09/13/2019 03/02/2019 01/16/2019  Decreased Interest 0 3 1 3   Down, Depressed, Hopeless 0 2 0 3  PHQ - 2 Score 0 5 1 6   Altered sleeping 0 3 1 2   Tired, decreased energy 0 3 1 2   Change in appetite 0 3 0 3  Feeling bad or failure about yourself  0 3 0 3  Trouble concentrating 0 3 1 2   Moving slowly or fidgety/restless 0 0 0 0  Suicidal thoughts 0 0 0 1  PHQ-9 Score 0 20 4 19   Difficult doing work/chores Not difficult at all Extremely dIfficult Not difficult at all Very difficult    ------------------------------------------------------------------------ Has seen Dr Terri [name] and started nortriptyline.  This is helping headaches significantly.  Optho found no evidence of optic disc issues or pseudotumor  She has had a hard time finding a dentist.  Got amoxicillin from UC for broken tooth.  Has h/o tooth abscess.   Allergies  Allergen Reactions  . Sulfa Antibiotics Other (See Comments)    Reaction: unknown.  Told allergic as child but believes she's taken since.  Marland Kitchen Zithromax [Azithromycin] Other (See Comments)    Reaction: unknown Told reaction as a child     Current Outpatient Medications:  .  amoxicillin (AMOXIL) 500 MG tablet, Take 1 tablet (500 mg total) by  mouth 3 (three) times daily., Disp: 21 tablet, Rfl: 0 .  ibuprofen (ADVIL) 800 MG tablet, Take 1 tablet (800 mg total) by mouth every 8 (eight) hours as needed., Disp: 21 tablet, Rfl: 0 .  nortriptyline (PAMELOR) 10 MG capsule, Take 20 mg by mouth daily., Disp: , Rfl:  .  sertraline (ZOLOFT) 100 MG tablet, Take 1 tablet (100 mg total) by mouth daily., Disp: 30 tablet, Rfl: 3 .  butalbital-acetaminophen-caffeine (FIORICET) 50-325-40 MG tablet, Take 1-2 tablets by mouth every 6 (six) hours as needed for headache. (Patient not taking: Reported on 10/30/2019), Disp: 30 tablet, Rfl: 1 .  promethazine (PHENERGAN) 25 MG tablet, Take 0.5-1 tablets (12.5-25 mg total) by mouth every 8 (eight) hours as needed for nausea or vomiting. (Patient not taking: Reported on 10/30/2019), Disp: 20 tablet, Rfl: 1  Review of Systems  Constitutional: Negative.   Respiratory: Negative.   Cardiovascular: Negative.   Psychiatric/Behavioral: Negative.     Social History   Tobacco Use  . Smoking status: Current Every Day Smoker    Packs/day: 0.50    Years: 8.00    Pack years: 4.00    Types: Cigarettes  . Smokeless tobacco: Never Used  Substance Use Topics  . Alcohol use: No      Objective:   LMP 06/18/2019  There were no vitals filed for this visit.There is no height or  weight on file to calculate BMI.   Physical Exam Constitutional:      General: She is not in acute distress.    Appearance: Normal appearance. She is not diaphoretic.  HENT:     Head: Normocephalic and atraumatic.  Pulmonary:     Effort: Pulmonary effort is normal. No respiratory distress.  Neurological:     Mental Status: She is alert and oriented to person, place, and time. Mental status is at baseline.  Psychiatric:        Mood and Affect: Mood normal.        Behavior: Behavior normal.      No results found for any visits on 10/30/19.     Assessment & Plan    Problem List Items Addressed This Visit      Other   GAD  (generalized anxiety disorder)    Chronic and well controlled Continue Zoloft 100mg  daily Encourage therapy      Relevant Medications   nortriptyline (PAMELOR) 10 MG capsule   Current moderate episode of major depressive disorder without prior episode (HCC) - Primary    Chronic and well controlled Continue Zoloft 100mg  daily Encourage therapy Contracted for safety- no SI/HI      Relevant Medications   nortriptyline (PAMELOR) 10 MG capsule   Headache disorder    Followed by Dr with Neurology Continue nortriptyline Much improved      Relevant Medications   nortriptyline (PAMELOR) 10 MG capsule   traMADol (ULTRAM) 50 MG tablet    Other Visit Diagnoses    Tooth ache       Relevant Orders   Ambulatory referral to Dentistry       Return in about 3 months (around 01/30/2020) for CPE.   The entirety of the information documented in the History of Present Illness, Review of Systems and Physical Exam were personally obtained by me. Portions of this information were initially documented by Malvin Johns, [name] and reviewed by me for thoroughness and accuracy.    Terri [name], 03/31/2020, MD MPH [facility]

## 2019-10-30 NOTE — Assessment & Plan Note (Signed)
Chronic and well controlled Continue Zoloft 100mg  daily Encourage therapy

## 2019-10-30 NOTE — Assessment & Plan Note (Signed)
Chronic and well controlled Continue Zoloft 100mg  daily Encourage therapy Contracted for safety- no SI/HI

## 2019-10-31 ENCOUNTER — Ambulatory Visit: Payer: Self-pay

## 2019-10-31 NOTE — Telephone Encounter (Signed)
Pt. Reports she is on an antibiotic for her tooth and yesterday Dr. Beryle Flock prescribed Tramadol. Woke up this morning with a headache and vomited x 2. No tooth pain today. Sweaty, "but I don't think I have a fever." Complain of neck pain with movement. History of migraines, "but this isn't a migraine." Took a Tramadol today without relief. Offered a visit,declines. Wants to know what her PCP thinks.  Answer Assessment - Initial Assessment Questions 1. LOCATION: "Where does it hurt?"      Hurts behind her eyes and forehead 2. ONSET: "When did the headache start?" (Minutes, hours or days)      This morning 3. PATTERN: "Does the pain come and go, or has it been constant since it started?"     Constant 4. SEVERITY: "How bad is the pain?" and "What does it keep you from doing?"  (e.g., Scale 1-10; mild, moderate, or severe)   - MILD (1-3): doesn't interfere with normal activities    - MODERATE (4-7): interferes with normal activities or awakens from sleep    - SEVERE (8-10): excruciating pain, unable to do any normal activities        9 5. RECURRENT SYMPTOM: "Have you ever had headaches before?" If so, ask: "When was the last time?" and "What happened that time?"      Yes 6. CAUSE:hat do you think is causing the headache?"     Unsure 7. MIGRAINE: "Have you been diagnosed with migraine headaches?" If so, ask: "Is this headache similar?"      Yes 8. HEAD INJURY: "Has there been any recent injury to the head?"      No 9. OTHER SYMPTOMS: "Do you have any other symptoms?" (fever, stiff neck, eye pain, sore throat, cold symptoms)     Vomited x 2, sweating, neck feels stiff 10. PREGNANCY: "Is there any chance you are pregnant?" "When was your last menstrual period?"       No  Protocols used: HEADACHE-A-AH

## 2019-10-31 NOTE — Telephone Encounter (Signed)
Patient of Dr. Beryle Flock, please review message below. KW

## 2019-11-01 NOTE — Telephone Encounter (Signed)
Attempted to reach patient to further triage and see how she is feeling today, voicemailbox is full unable to leave message. KW

## 2019-11-01 NOTE — Telephone Encounter (Signed)
Could be a reaction to the Tramadol.  Would stop it.  If symptoms are persisting, would recommend evaluation.

## 2019-11-01 NOTE — Telephone Encounter (Signed)
You  Fonda Kinder, [name] 54 minutes ago (12:01 PM)     Sorry I just saw this message. Please triage patient and if symptoms still persisting she likely needs an evaluation.

## 2019-11-01 NOTE — Telephone Encounter (Signed)
Patient advised as below. Patient requesting a different medication for tooth pain. Please advise.

## 2019-11-01 NOTE — Telephone Encounter (Signed)
Sorry I just saw this message. Please triage patient and if symptoms still persisting she likely needs an evaluation.

## 2019-11-02 NOTE — Telephone Encounter (Signed)
Ok to eRx naproxen 500mg  BID prn #30 r0

## 2019-11-03 MED ORDER — NAPROXEN 500 MG PO TABS: 500.0000 mg | ORAL_TABLET | Freq: Two times a day (BID) | ORAL | 0 refills | Status: DC

## 2019-11-03 NOTE — Addendum Note (Signed)
Addended by: Hyacinth Meeker on: 11/03/2019 04:37 PM   Modules accepted: Orders

## 2019-11-03 NOTE — Telephone Encounter (Signed)
Prescription sent in. Called patient but NA and voice mail is full.

## 2019-12-12 ENCOUNTER — Emergency Department
Admission: EM | Admit: 2019-12-12 | Discharge: 2019-12-12 | Disposition: A | Discharge status: Home / Self Care | Payer: Medicaid Other | Attending: Emergency Medicine | Admitting: Emergency Medicine

## 2019-12-12 ENCOUNTER — Other Ambulatory Visit: Payer: Self-pay

## 2019-12-12 DIAGNOSIS — K0889 Other specified disorders of teeth and supporting structures: Secondary | ICD-10-CM | POA: Diagnosis not present

## 2019-12-12 DIAGNOSIS — Z79899 Other long term (current) drug therapy: Secondary | ICD-10-CM | POA: Diagnosis not present

## 2019-12-12 DIAGNOSIS — F17210 Nicotine dependence, cigarettes, uncomplicated: Secondary | ICD-10-CM | POA: Diagnosis not present

## 2019-12-12 MED ORDER — TRAMADOL HCL 50 MG PO TABS: 50.0000 mg | ORAL_TABLET | Freq: Four times a day (QID) | ORAL | 0 refills | Status: DC | PRN

## 2019-12-12 MED ORDER — IBUPROFEN 800 MG PO TABS: 800.0000 mg | ORAL_TABLET | Freq: Three times a day (TID) | ORAL | 0 refills | Status: DC | PRN

## 2019-12-12 MED ORDER — AMOXICILLIN 500 MG PO TABS: 500.0000 mg | ORAL_TABLET | Freq: Three times a day (TID) | ORAL | 0 refills | Status: DC

## 2019-12-12 NOTE — ED Triage Notes (Signed)
Pt c/o right lower tooth ache since last night, states she has a dental appt the 24th

## 2019-12-12 NOTE — ED Notes (Signed)
See triage note  Presents with right lower dental pain  Possible abscess  Pain started last pm

## 2019-12-12 NOTE — ED Provider Notes (Signed)
[facility] Emergency Department Provider Note   ____________________________________________   First MD Initiated Contact with Patient 12/12/19 [phone]     (approximate)  I have reviewed the triage vital signs and the nursing notes.   HISTORY  Chief Complaint Dental Pain    HPI Terri [name] is a 24 y.o. female patient presents with dental pain which started last night.  Patient has a history of devitalized teeth and is scheduled to see dentist on the 24th of this month.  Patient rates pain at a 9/10.  Patient described pain as "achy".  No relief with over-the-counter anti-inflammatory medications.         Past Medical History:  Diagnosis Date  . No pertinent past medical history     Patient Active Problem List   Diagnosis Date Noted  . Headache disorder 10/30/2019  . Polyarthralgia 09/13/2019  . Joint stiffness 09/13/2019  . GAD (generalized anxiety disorder) 01/16/2019  . Current moderate episode of major depressive disorder without prior episode (HCC) 01/16/2019  . PTSD (post-traumatic stress disorder) 01/16/2019  . Tobacco use disorder 01/16/2019  . 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 CHOLECYSTECTOMY N/A 11/04/2018   Procedure: LAPAROSCOPIC CHOLECYSTECTOMY;  Surgeon: Henrene Dodge, MD;  Location: [facility] ORS;  Service: General;  Laterality: N/A;  . DILATION AND EVACUATION N/A 07/13/2017   Procedure: DILATATION AND EVACUATION;  Surgeon: Ward, Elenora Fender, MD;  Location: [facility] ORS;  Service: Gynecology;  Laterality: N/A;  . ERCP N/A 11/14/2018   Procedure: ENDOSCOPIC RETROGRADE CHOLANGIOPANCREATOGRAPHY (ERCP);  Surgeon: Midge Minium, MD;  Location: [facility] ENDOSCOPY;  Service: Endoscopy;  Laterality: N/A;    Prior to Admission medications   Medication Sig Start Date End Date Taking? Authorizing Provider  amoxicillin (AMOXIL) 500 MG tablet Take 1 tablet (500 mg total) by mouth 3 (three) times  daily. 12/12/19   Joni Reining, PA-C  ibuprofen (ADVIL) 800 MG tablet Take 1 tablet (800 mg total) by mouth every 8 (eight) hours as needed. 12/12/19   Joni Reining, PA-C  naproxen (NAPROSYN) 500 MG tablet Take 1 tablet (500 mg total) by mouth 2 (two) times daily with a meal. 11/03/19   Bacigalupo, Marzella Schlein, MD  nortriptyline (PAMELOR) 10 MG capsule Take 20 mg by mouth daily. 10/17/19   [provider]  sertraline (ZOLOFT) 100 MG tablet Take 1 tablet (100 mg total) by mouth daily. 09/13/19   Bacigalupo, Marzella Schlein, MD  traMADol (ULTRAM) 50 MG tablet Take 1 tablet (50 mg total) by mouth every 6 (six) hours as needed. 12/12/19 12/11/20  Joni Reining, PA-C    Allergies Sulfa antibiotics and Zithromax [azithromycin]  Family History  Problem Relation Age of Onset  . Arthritis Mother   . Thyroid disease Mother   . Depression Mother   . Anxiety disorder Mother   . Anxiety disorder Maternal Grandmother   . Depression Maternal Grandmother   . COPD Maternal Grandmother   . ALS Maternal Grandfather   . Breast cancer Neg Hx   . Colon cancer Neg Hx     Social History Social History   Tobacco Use  . Smoking status: Current Every Day Smoker    Packs/day: 0.50    Years: 8.00    Pack years: 4.00    Types: Cigarettes  . Smokeless tobacco: Never Used  Substance Use Topics  . Alcohol use: No  . Drug use: No    Review of Systems Constitutional: No fever/chills  Eyes: No visual changes. ENT: No sore throat.  Dental pain. Cardiovascular: Denies chest pain. Respiratory: Denies shortness of breath. Gastrointestinal: No abdominal pain.  No nausea, no vomiting.  No diarrhea.  No constipation. Genitourinary: Negative for dysuria. Musculoskeletal: Negative for back pain. Skin: Negative for rash. Neurological: Negative for headaches, focal weakness or numbness. Psychiatric: PTSD  Allergic/Immunilogical: Sulfur and Zithromax.  ____________________________________________   PHYSICAL  EXAM:  VITAL SIGNS: ED Triage Vitals  Enc Vitals Group     BP 12/12/19 0728 (!) 142/89     Pulse Rate 12/12/19 0728 72     Resp 12/12/19 0728 16     Temp 12/12/19 0728 98.7 F (37.1 C)     Temp Source 12/12/19 0728 Oral     SpO2 12/12/19 0728 98 %     Weight 12/12/19 0729 250 lb (113.4 kg)     Height 12/12/19 0729 5\' 3"  (1.6 m)     Head Circumference --      Peak Flow --      Pain Score 12/12/19 0728 9     Pain Loc --      Pain Edu? --      Excl. in [place]? --    Constitutional: Alert and oriented. Well appearing and in no acute distress. Mouth/Throat: Mucous membranes are moist.  Oropharynx non-erythematous.  Multiple fraction devitalized teeth. Neck: No stridor.   Cardiovascular: Normal rate, regular rhythm. Grossly normal heart sounds.  Good peripheral circulation. Respiratory: Normal respiratory effort.  No retractions. Lungs CTAB.  ____________________________________________   LABS (all labs ordered are listed, but only abnormal results are displayed)  Labs Reviewed - No data to display ____________________________________________  EKG   ____________________________________________  RADIOLOGY  ED MD interpretation:    Official radiology report(s): No results found.  ____________________________________________   PROCEDURES  Procedure(s) performed (including Critical Care):  Procedures   ____________________________________________   INITIAL IMPRESSION / ASSESSMENT AND PLAN / ED COURSE  As part of my medical decision making, I reviewed the following data within the [place]     Patient presents with dental pain secondary to multiple devitalized infected teeth.  Patient with follow-up scheduled dental appointment.  Follow discharge care instruction take medication as directed.    [name] was evaluated in Emergency Department on 12/12/2019 for the symptoms described in the history of present illness. She was evaluated in  the context of the global COVID-19 pandemic, which necessitated consideration that the patient might be at risk for infection with the SARS-CoV-2 virus that causes COVID-19. Institutional protocols and algorithms that pertain to the evaluation of patients at risk for COVID-19 are in a state of rapid change based on information released by regulatory bodies including the CDC and federal and state organizations. These policies and algorithms were followed during the patient's care in the ED.       ____________________________________________   FINAL CLINICAL IMPRESSION(S) / ED DIAGNOSES  Final diagnoses:  Pain, dental     ED Discharge Orders         Ordered    amoxicillin (AMOXIL) 500 MG tablet  3 times daily     12/12/19 0910    ibuprofen (ADVIL) 800 MG tablet  Every 8 hours PRN     12/12/19 0910    traMADol (ULTRAM) 50 MG tablet  Every 6 hours PRN     12/12/19 0910           Note:  This document was prepared using Dragon voice recognition software  and may include unintentional dictation errors.    Joni Reining, PA-C 12/12/19 1308    Shaune Pollack, MD 12/18/19 2141

## 2019-12-12 NOTE — Discharge Instructions (Addendum)
Follow discharge care instruction.  Follow-up as scheduled dental appointment with Dr. Pollie Friar.

## 2020-01-17 ENCOUNTER — Emergency Department
Admission: EM | Admit: 2020-01-17 | Discharge: 2020-01-17 | Disposition: A | Discharge status: Home / Self Care | Payer: Medicaid Other | Attending: Emergency Medicine | Admitting: Emergency Medicine

## 2020-01-17 ENCOUNTER — Other Ambulatory Visit: Payer: Self-pay

## 2020-01-17 DIAGNOSIS — K0889 Other specified disorders of teeth and supporting structures: Secondary | ICD-10-CM | POA: Insufficient documentation

## 2020-01-17 DIAGNOSIS — Z79899 Other long term (current) drug therapy: Secondary | ICD-10-CM | POA: Insufficient documentation

## 2020-01-17 DIAGNOSIS — K0381 Cracked tooth: Secondary | ICD-10-CM | POA: Diagnosis not present

## 2020-01-17 DIAGNOSIS — F17210 Nicotine dependence, cigarettes, uncomplicated: Secondary | ICD-10-CM | POA: Diagnosis not present

## 2020-01-17 MED ORDER — CLINDAMYCIN HCL 150 MG PO CAPS: ORAL_CAPSULE | ORAL | 0 refills | Status: DC

## 2020-01-17 MED ORDER — HYDROCODONE-ACETAMINOPHEN 5-325 MG PO TABS: 1.0000 | ORAL_TABLET | Freq: Four times a day (QID) | ORAL | 0 refills | Status: DC | PRN

## 2020-01-17 NOTE — Discharge Instructions (Addendum)
Keep your appointment with the dental surgeon on Saturday.  Begin taking hydrocodone as needed for pain.  Do not drive or operate machinery while taking this medication.  A prescription for clindamycin also was sent to the pharmacy.  Discontinuing smoking at this time would also help with your dental pain.

## 2020-01-17 NOTE — ED Notes (Signed)
See triage note- pt here with severe left bottom teeth and jaw pain that radiates to left ear. States numbing medicine at the dentist didn't work when she was getting her teeth pulled today, dentist broke one tooth but was unable to pull any teeth.  Has been using ibuprofen, orajel and tylenol without relief.

## 2020-01-17 NOTE — ED Provider Notes (Signed)
[facility] Emergency Department Provider Note   ____________________________________________   First MD Initiated Contact with Patient 01/17/20 1340     (approximate)  I have reviewed the triage vital signs and the nursing notes.   HISTORY  Chief Complaint Dental Pain   HPI Terri [name] is a 23 y.o. female presents to the ED after being seen at the dentist office today.  Patient states that she has increased dental pain after a tooth was broken off at the dentist office.  Previously had been placed on amoxicillin and was taking ibuprofen.  She states that the ibuprofen is no longer helping and that she is almost out of amoxicillin.  She is scheduled to see an oral surgeon on Saturday.  Patient does continue to smoke.  She rates her pain as an 8 out of 10.      Past Medical History:  Diagnosis Date  . No pertinent past medical history     Patient Active Problem List   Diagnosis Date Noted  . Headache disorder 10/30/2019  . Polyarthralgia 09/13/2019  . Joint stiffness 09/13/2019  . GAD (generalized anxiety disorder) 01/16/2019  . Current moderate episode of major depressive disorder without prior episode (HCC) 01/16/2019  . PTSD (post-traumatic stress disorder) 01/16/2019  . Tobacco use disorder 01/16/2019  . 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 CHOLECYSTECTOMY N/A 11/04/2018   Procedure: LAPAROSCOPIC CHOLECYSTECTOMY;  Surgeon: Henrene Dodge, MD;  Location: [facility] ORS;  Service: General;  Laterality: N/A;  . DILATION AND EVACUATION N/A 07/13/2017   Procedure: DILATATION AND EVACUATION;  Surgeon: Ward, Elenora Fender, MD;  Location: [facility] ORS;  Service: Gynecology;  Laterality: N/A;  . ERCP N/A 11/14/2018   Procedure: ENDOSCOPIC RETROGRADE CHOLANGIOPANCREATOGRAPHY (ERCP);  Surgeon: Midge Minium, MD;  Location: [facility] ENDOSCOPY;  Service: Endoscopy;  Laterality: N/A;    Prior to Admission medications    Medication Sig Start Date End Date Taking? Authorizing Provider  amoxicillin (AMOXIL) 500 MG tablet Take 1 tablet (500 mg total) by mouth 3 (three) times daily. 12/12/19   Joni Reining, PA-C  clindamycin (CLEOCIN) 150 MG capsule I tab q 6 hours until finished 01/17/20   Tommi Rumps, PA-C  HYDROcodone-acetaminophen (NORCO/VICODIN) 5-325 MG tablet Take 1 tablet by mouth every 6 (six) hours as needed for moderate pain. 01/17/20   Tommi Rumps, PA-C  nortriptyline (PAMELOR) 10 MG capsule Take 20 mg by mouth daily. 10/17/19   [provider]  sertraline (ZOLOFT) 100 MG tablet Take 1 tablet (100 mg total) by mouth daily. 09/13/19   Erasmo Downer, MD    Allergies Sulfa antibiotics and Zithromax [azithromycin]  Family History  Problem Relation Age of Onset  . Arthritis Mother   . Thyroid disease Mother   . Depression Mother   . Anxiety disorder Mother   . Anxiety disorder Maternal Grandmother   . Depression Maternal Grandmother   . COPD Maternal Grandmother   . ALS Maternal Grandfather   . Breast cancer Neg Hx   . Colon cancer Neg Hx     Social History Social History   Tobacco Use  . Smoking status: Current Every Day Smoker    Packs/day: 0.50    Years: 8.00    Pack years: 4.00    Types: Cigarettes  . Smokeless tobacco: Never Used  Substance Use Topics  . Alcohol use: No  . Drug use: No    Review of Systems Constitutional: No fever/chills  Eyes: No visual changes. ENT: No sore throat.  Positive dental pain. Cardiovascular: Denies chest pain. Respiratory: Denies shortness of breath. Musculoskeletal: Negative for back pain. Skin: Negative for rash. Neurological: Negative for headaches ___________________________________________   PHYSICAL EXAM:  VITAL SIGNS: ED Triage Vitals  Enc Vitals Group     BP 01/17/20 1246 (!) 141/91     Pulse Rate 01/17/20 1246 76     Resp 01/17/20 1246 18     Temp 01/17/20 1246 98.5 F (36.9 C)     Temp src --       SpO2 01/17/20 1246 99 %     Weight 01/17/20 1241 175 lb (79.4 kg)     Height 01/17/20 1241 5\' 3"  (1.6 m)     Head Circumference --      Peak Flow --      Pain Score 01/17/20 1241 8     Pain Loc --      Pain Edu? --      Excl. in [place]? --     Constitutional: Alert and oriented. Well appearing and in no acute distress.  Patient is tearful. Eyes: Conjunctivae are normal.  Head: Atraumatic. Nose: No congestion/rhinnorhea. Mouth/Throat: Mucous membranes are moist.  Oropharynx non-erythematous.  Left lower premolars and molars are in poor repair with what looks to be an acute avulsion of a premolar.  No active bleeding is noted.   Neck: No stridor.   Cardiovascular: Normal rate, regular rhythm. Grossly normal heart sounds.  Good peripheral circulation. Respiratory: Normal respiratory effort.  No retractions. Lungs CTAB. Neurologic:  Normal speech and language. No gross focal neurologic deficits are appreciated.  Skin:  Skin is warm, dry and intact. No rash noted. Psychiatric: Mood and affect are normal. Speech and behavior are normal.  ____________________________________________   LABS (all labs ordered are listed, but only abnormal results are displayed)  Labs Reviewed - No data to display  PROCEDURES  Procedure(s) performed (including Critical Care):  Procedures   ____________________________________________   INITIAL IMPRESSION / ASSESSMENT AND PLAN / ED COURSE  As part of my medical decision making, I reviewed the following data within the electronic MEDICAL RECORD NUMBER Notes from prior ED visits and [place] Controlled Substance Database  24 year old female presents to the ED with complaint of acute dental pain.  Patient came from Dr. Willette Alma office to the ED after a tooth was avulsed while trying to pull it.  Patient is scheduled to see a oral surgeon on Saturday.  She has been taking ibuprofen and using Orajel without any relief.  Physical exam is consistent with a recent dental  avulsion with no active bleeding at present.  Patient was given a prescription for clindamycin 150 mg until seen by the oral surgeon and hydrocodone as needed for pain.  She is encouraged to discontinue smoking. ____________________________________________   FINAL CLINICAL IMPRESSION(S) / ED DIAGNOSES  Final diagnoses:  Pain, dental     ED Discharge Orders         Ordered    HYDROcodone-acetaminophen (NORCO/VICODIN) 5-325 MG tablet  Every 6 hours PRN     01/17/20 1430    clindamycin (CLEOCIN) 150 MG capsule     01/17/20 1430           Note:  This document was prepared using Dragon voice recognition software and may include unintentional dictation errors.    Johnn Hai, PA-C 01/17/20 1443    Earleen Newport, MD 01/17/20 [phone]

## 2020-01-17 NOTE — ED Triage Notes (Signed)
Pt comes via POV from dentist with c/o dental pain and possible broken tooth. Pt states she was at dentist and the numbing medication didn't work the patient believes the dentist broke off her lower tooth.  Pt states severe pain.

## 2020-03-14 ENCOUNTER — Telehealth: Payer: Self-pay | Admitting: Physician Assistant

## 2020-03-14 ENCOUNTER — Telehealth (INDEPENDENT_AMBULATORY_CARE_PROVIDER_SITE_OTHER): Payer: Medicaid Other | Admitting: Physician Assistant

## 2020-03-14 DIAGNOSIS — E663 Overweight: Secondary | ICD-10-CM

## 2020-03-14 MED ORDER — TOPIRAMATE 50 MG PO TABS: ORAL_TABLET | ORAL | 0 refills | Status: DC

## 2020-03-14 NOTE — Telephone Encounter (Signed)
Please schedule one month weight loss follow up with Dr. Leonard Schwartz thanks.

## 2020-03-14 NOTE — Progress Notes (Signed)
MyChart Video Visit    Virtual Visit via Video Note   This visit type was conducted due to national recommendations for restrictions regarding the COVID-19 Pandemic (e.g. social distancing) in an effort to limit this patient's exposure and mitigate transmission in our community. This patient is at least at moderate risk for complications without adequate follow up. This format is felt to be most appropriate for this patient at this time. Physical exam was limited by quality of the video and audio technology used for the visit.   Patient location: Home Provider location: Office    I discussed the limitations of evaluation and management by telemedicine and the availability of in person appointments. The patient expressed understanding and agreed to proceed.    Patient: Terri [name]   DOB: [birth year]   24 y.o. Female  MRN: [id number] Visit Date: 03/14/2020  Today's healthcare provider: Trey Sailors, PA-C   Chief Complaint  Patient presents with  . Weight Management  I,Porsha C McClurkin,acting as a scribe for Trey Sailors, PA-C.,have documented all relevant documentation on the behalf of Trey Sailors, PA-C,as directed by  Trey Sailors, PA-C while in the presence of Trey Sailors, PA-C.  Subjective    HPI  Weight Management Patient presents today for weight loss management. Patient reports that she was recently weighted and the scale said 247 pounds. Patient last weight in our system was 175 pounds. Patient reports that she does not eat healthy and needs help working on weight loss. Patient has not made attempts to modify her diet.   Breakfast: cereal, milk Lunch: sandwiches, chips,fast food restaurants Dinner:fast food restaurants: hamburgers, fries Snacks: ice cream, chocolate, chips, cookies, cakes Beverage: milk, water and Dr. Reino Kent     Medications: Outpatient Medications Prior to Visit  Medication Sig  . amoxicillin (AMOXIL) 500 MG tablet  Take 1 tablet (500 mg total) by mouth 3 (three) times daily. (Patient not taking: Reported on 03/14/2020)  . clindamycin (CLEOCIN) 150 MG capsule I tab q 6 hours until finished (Patient not taking: Reported on 03/14/2020)  . HYDROcodone-acetaminophen (NORCO/VICODIN) 5-325 MG tablet Take 1 tablet by mouth every 6 (six) hours as needed for moderate pain. (Patient not taking: Reported on 03/14/2020)  . nortriptyline (PAMELOR) 10 MG capsule Take 20 mg by mouth daily. (Patient not taking: Reported on 03/14/2020)  . sertraline (ZOLOFT) 100 MG tablet Take 1 tablet (100 mg total) by mouth daily. (Patient not taking: Reported on 03/14/2020)   No facility-administered medications prior to visit.    Review of Systems    Objective    There were no vitals taken for this visit.   Physical Exam Constitutional:      Appearance: Normal appearance.  Neurological:     Mental Status: She is alert.  Psychiatric:        Mood and Affect: Mood normal.        Behavior: Behavior normal.        Assessment & Plan    1. Overweight  Counseled that diet and exercise modifications are necessary for sustained weight loss. Topamax as below for appetite suppressant and may also help with her migraines. F/u 6 weeks.   - topiramate (TOPAMAX) 50 MG tablet; Take 1/2 tab nightly x 1 wk. Then take 1 tab nightly x 1 wk. Then take 1.5 tabs nightly x 1 wk. Then take 2 tabs nightly onward.  Dispense: 90 tablet; Refill: 0    No follow-ups on file.  I discussed the assessment and treatment plan with the patient. The patient was provided an opportunity to ask questions and all were answered. The patient agreed with the plan and demonstrated an understanding of the instructions.   The patient was advised to call back or seek an in-person evaluation if the symptoms worsen or if the condition fails to improve as anticipated.   ITrey Sailors, PA-C, have reviewed all documentation for this visit. The documentation on  03/18/20 for the exam, diagnosis, procedures, and orders are all accurate and complete.   Maryella Shivers [facility] [phone] (phone) [phone] (fax)  [facility]

## 2020-03-15 NOTE — Telephone Encounter (Signed)
Dr. Sadie Haber be back in time for a month follow up.  Schedule with adriana for 04/12/2020 as a Mychart visit.   Thanks,   -Vernona Rieger

## 2020-04-11 NOTE — Progress Notes (Signed)
Patient not on video platform at scheduled time. Called patient multiple times between 9:00 AM and 9:13 AM. Left message as nobody answered. NO SHOW.  This is patient's fourth no show in one year. This note will be forwarded to her PCP for her awareness.

## 2020-04-12 ENCOUNTER — Telehealth (INDEPENDENT_AMBULATORY_CARE_PROVIDER_SITE_OTHER): Payer: Medicaid Other | Admitting: Physician Assistant

## 2020-04-12 DIAGNOSIS — Z5329 Procedure and treatment not carried out because of patient's decision for other reasons: Secondary | ICD-10-CM

## 2020-04-12 DIAGNOSIS — Z91199 Patient's noncompliance with other medical treatment and regimen due to unspecified reason: Secondary | ICD-10-CM

## 2020-04-21 DIAGNOSIS — Z20822 Contact with and (suspected) exposure to covid-19: Secondary | ICD-10-CM | POA: Diagnosis not present

## 2020-05-01 ENCOUNTER — Encounter: Payer: Self-pay | Admitting: Family Medicine

## 2020-08-31 NOTE — L&D Delivery Note (Signed)
Delivery Summary for Terri [name]  Labor Events:   Preterm labor: No data found  Rupture date: 06/24/2021  Rupture time: 9:30 AM  Rupture type: Spontaneous Intact  Fluid Color: Clear  Induction: No data found  Augmentation: No data found  Complications: No data found  Cervical ripening: No data found No data found   No data found     Delivery:   Episiotomy: No data found  Lacerations: No data found  Repair suture: No data found  Repair # of packets: No data found  Blood loss (ml): No data found   Information for the patient's newborn:  Novi, Calia [[id number]]   Delivery 06/24/2021 10:33 AM by  Vaginal, Spontaneous Sex:  female Gestational Age: [redacted]w[redacted]d Delivery Clinician:   Living?:         APGARS  One minute Five minutes Ten minutes  Skin color:        Heart rate:        Grimace:        Muscle tone:        Breathing:        Totals: 7  9      Presentation/position:      Resuscitation:   Cord information:    Disposition of cord blood:     Blood gases sent?  Complications:   Placenta: Delivered:       appearance Newborn Measurements: Weight: 6 lb 13.7 oz (3110 g)  Height: 20"  Head circumference:    Chest circumference:    Other providers:    Additional  information: Forceps:   Vacuum:   Breech:   Observed anomalies      Delivery Note At 10:33 AM a viable and healthy female was delivered via Vaginal, Spontaneous (Presentation: Vertex, ROA position).  APGAR: 7, 9; weight 6 lb 13.7 oz (3110 g).   Placenta status: Spontaneous, Intact.  Cord: 3 vessels with the following complications: Nuchal cord x 2, reducible after delivery of fetal body.  Cord pH: not obtained.  Delayed cord clamping observed for 1 minute.   Post-placental Kyleena IUD placed (see separate procedure note).   Anesthesia: Epidural Episiotomy: None Lacerations: None Suture Repair:  None Est. Blood Loss (mL):  150  Mom to postpartum.  Baby to Couplet care / Skin to  Skin.   Hildred Laser, MD 06/24/2021, 10:52 AM

## 2020-10-19 ENCOUNTER — Emergency Department
Admission: EM | Admit: 2020-10-19 | Discharge: 2020-10-19 | Disposition: A | Discharge status: Home / Self Care | Payer: Medicaid Other | Attending: Emergency Medicine | Admitting: Emergency Medicine

## 2020-10-19 ENCOUNTER — Other Ambulatory Visit: Payer: Self-pay

## 2020-10-19 DIAGNOSIS — R002 Palpitations: Secondary | ICD-10-CM | POA: Insufficient documentation

## 2020-10-19 DIAGNOSIS — F17210 Nicotine dependence, cigarettes, uncomplicated: Secondary | ICD-10-CM | POA: Diagnosis not present

## 2020-10-19 DIAGNOSIS — R110 Nausea: Secondary | ICD-10-CM | POA: Insufficient documentation

## 2020-10-19 DIAGNOSIS — R509 Fever, unspecified: Secondary | ICD-10-CM | POA: Insufficient documentation

## 2020-10-19 DIAGNOSIS — R0789 Other chest pain: Secondary | ICD-10-CM

## 2020-10-19 DIAGNOSIS — Z20822 Contact with and (suspected) exposure to covid-19: Secondary | ICD-10-CM | POA: Diagnosis not present

## 2020-10-19 DIAGNOSIS — R5383 Other fatigue: Secondary | ICD-10-CM | POA: Diagnosis not present

## 2020-10-19 DIAGNOSIS — M791 Myalgia, unspecified site: Secondary | ICD-10-CM | POA: Diagnosis present

## 2020-10-19 LAB — RESP PANEL BY RT-PCR (FLU A&B, COVID) ARPGX2
Influenza A by PCR: NEGATIVE
Influenza B by PCR: NEGATIVE
SARS Coronavirus 2 by RT PCR: NEGATIVE

## 2020-10-19 NOTE — ED Provider Notes (Signed)
[facility] Emergency Department Provider Note ____________________________________________   Event Date/Time   First MD Initiated Contact with Patient 10/19/20 [phone]     (approximate)  I have reviewed the triage vital signs and the nursing notes.   HISTORY  Chief Complaint Generalized Body Aches    HPI Terri [name] is a 25 y.o. female with no active medical problems who presents with generalized body aches, nausea, fatigue, and subjective low-grade fever over the last few days.  The patient also reports some intermittent chest tightness and palpitations over the last week.  She denies any prior history of this.  She states that she does feel slightly anxious.  She reports that her mother tested positive for Covid, and while in the ED the patient's son also tested positive for Covid.  Past Medical History:  Diagnosis Date  . No pertinent past medical history     Patient Active Problem List   Diagnosis Date Noted  . Headache disorder 10/30/2019  . Polyarthralgia 09/13/2019  . Joint stiffness 09/13/2019  . GAD (generalized anxiety disorder) 01/16/2019  . Current moderate episode of major depressive disorder without prior episode (HCC) 01/16/2019  . PTSD (post-traumatic stress disorder) 01/16/2019  . Tobacco use disorder 01/16/2019  . 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 CHOLECYSTECTOMY N/A 11/04/2018   Procedure: LAPAROSCOPIC CHOLECYSTECTOMY;  Surgeon: Henrene Dodge, MD;  Location: [facility] ORS;  Service: General;  Laterality: N/A;  . DILATION AND EVACUATION N/A 07/13/2017   Procedure: DILATATION AND EVACUATION;  Surgeon: Ward, Elenora Fender, MD;  Location: [facility] ORS;  Service: Gynecology;  Laterality: N/A;  . ERCP N/A 11/14/2018   Procedure: ENDOSCOPIC RETROGRADE CHOLANGIOPANCREATOGRAPHY (ERCP);  Surgeon: Midge Minium, MD;  Location: [facility] ENDOSCOPY;  Service: Endoscopy;  Laterality: N/A;    Prior to  Admission medications   Medication Sig Start Date End Date Taking? Authorizing Provider  amoxicillin (AMOXIL) 500 MG tablet Take 1 tablet (500 mg total) by mouth 3 (three) times daily. Patient not taking: Reported on 03/14/2020 12/12/19   Joni Reining, PA-C  clindamycin (CLEOCIN) 150 MG capsule I tab q 6 hours until finished Patient not taking: Reported on 03/14/2020 01/17/20   Tommi Rumps, PA-C  HYDROcodone-acetaminophen (NORCO/VICODIN) 5-325 MG tablet Take 1 tablet by mouth every 6 (six) hours as needed for moderate pain. Patient not taking: Reported on 03/14/2020 01/17/20   Tommi Rumps, PA-C  nortriptyline (PAMELOR) 10 MG capsule Take 20 mg by mouth daily. Patient not taking: Reported on 03/14/2020 10/17/19   [provider]  sertraline (ZOLOFT) 100 MG tablet Take 1 tablet (100 mg total) by mouth daily. Patient not taking: Reported on 03/14/2020 09/13/19   Erasmo Downer, MD  topiramate (TOPAMAX) 50 MG tablet Take 1/2 tab nightly x 1 wk. Then take 1 tab nightly x 1 wk. Then take 1.5 tabs nightly x 1 wk. Then take 2 tabs nightly onward. 03/14/20   Trey Sailors, PA-C    Allergies Sulfa antibiotics and Zithromax [azithromycin]  Family History  Problem Relation Age of Onset  . Arthritis Mother   . Thyroid disease Mother   . Depression Mother   . Anxiety disorder Mother   . Anxiety disorder Maternal Grandmother   . Depression Maternal Grandmother   . COPD Maternal Grandmother   . ALS Maternal Grandfather   . Breast cancer Neg Hx   . Colon cancer Neg Hx     Social History Social History  Tobacco Use  . Smoking status: Current Every Day Smoker    Packs/day: 0.50    Years: 8.00    Pack years: 4.00    Types: Cigarettes  . Smokeless tobacco: Never Used  Vaping Use  . Vaping Use: Never used  Substance Use Topics  . Alcohol use: No  . Drug use: No    Review of Systems  Constitutional: Positive for subjective fever. Eyes: No redness. ENT: No sore  throat or congestion. Cardiovascular: Positive for intermittent chest discomfort. Respiratory: Denies shortness of breath. Gastrointestinal: No vomiting or diarrhea.  Genitourinary: Negative for dysuria.  Musculoskeletal: Negative for back pain. Skin: Negative for rash. Neurological: Negative for headache.   ____________________________________________   PHYSICAL EXAM:  VITAL SIGNS: ED Triage Vitals  Enc Vitals Group     BP 10/19/20 0157 (!) 147/98     Pulse Rate 10/19/20 0157 95     Resp 10/19/20 0157 17     Temp 10/19/20 0157 98.9 F (37.2 C)     Temp Source 10/19/20 0157 Oral     SpO2 10/19/20 0157 97 %     Weight 10/19/20 0158 240 lb (108.9 kg)     Height 10/19/20 0158 5\' 3"  (1.6 m)     Head Circumference --      Peak Flow --      Pain Score 10/19/20 0158 3     Pain Loc --      Pain Edu? --      Excl. in [place]? --     Constitutional: Alert and oriented. Well appearing and in no acute distress. Eyes: Conjunctivae are normal.  Head: Atraumatic. Nose: No congestion/rhinnorhea. Mouth/Throat: Mucous membranes are moist.   Neck: Normal range of motion.  Cardiovascular: Normal rate, regular rhythm.  Good peripheral circulation. Respiratory: Normal respiratory effort.  No retractions. Gastrointestinal: No distention.  Musculoskeletal: Extremities warm and well perfused.  Neurologic:  Normal speech and language. No gross focal neurologic deficits are appreciated.  Skin:  Skin is warm and dry. No rash noted. Psychiatric: Mood and affect are normal. Speech and behavior are normal.  ____________________________________________   LABS (all labs ordered are listed, but only abnormal results are displayed)  Labs Reviewed  RESP PANEL BY RT-PCR (FLU A&B, COVID) [id number]   ____________________________________________  EKG  ED ECG REPORT I, 10/21/20, the attending physician, personally viewed and interpreted this ECG.  Date: 10/19/2020 EKG Time: 0413 Rate:  84 Rhythm: normal sinus rhythm QRS Axis: normal Intervals: normal ST/T Wave abnormalities: normal Narrative Interpretation: no evidence of acute ischemia  ____________________________________________  RADIOLOGY    ____________________________________________   PROCEDURES  Procedure(s) performed: No  Procedures  Critical Care performed: No ____________________________________________   INITIAL IMPRESSION / ASSESSMENT AND PLAN / ED COURSE  Pertinent labs & imaging results that were available during my care of the patient were reviewed by me and considered in my medical decision making (see chart for details).  25 year old female with no active medical problems presents with body aches, nausea, fatigue, and low-grade temperature over the last few days.  She also has had some intermittent chest tightness and palpitations over the last week.  She has no active chest pain at this time.  Of note, the patient's mother recently tested positive for Covid, and her son tested positive while in the ED.  On exam the patient is well-appearing.  Her vital signs are normal.  The physical exam is unremarkable.   EKG was obtained and shows no acute abnormalities.  Differential  diagnosis includes COVID-19, influenza, other viral syndrome, dehydration, or other benign etiology.  There is no evidence of primary cardiac cause in this young and otherwise healthy patient.  She has no cardiac risk factors.  There is no evidence of PE.  She cannot be ruled out by [facility].  We will await her Covid swab and reassess.  ----------------------------------------- 5:13 AM on 10/19/2020 -----------------------------------------  The patient is Covid negative at this time.  She continues to appear comfortable.  She is stable for discharge home.  I counseled her on the results of the work-up.  Return precautions given, and she expresses understanding.  ____________________________________________   FINAL  CLINICAL IMPRESSION(S) / ED DIAGNOSES  Final diagnoses:  Atypical chest pain      NEW MEDICATIONS STARTED DURING THIS VISIT:  New Prescriptions   No medications on file     Note:  This document was prepared using Dragon voice recognition software and may include unintentional dictation errors.    Dionne Bucy, MD 10/19/20 [phone]

## 2020-10-19 NOTE — ED Triage Notes (Signed)
Pt presents to ER c/o HA, generalized body aches and nausea x3 days.  Pt states she was around her mother, who was dx with COVID within the last week, and started having symptoms shortly after.

## 2020-10-19 NOTE — Discharge Instructions (Addendum)
Return to the ER for new, worsening, or persistent severe chest pain, difficulty breathing, palpitations, weakness or lightheadedness, or any other new or worsening symptoms that concern you.  Follow-up with your primary care doctor.

## 2020-10-28 ENCOUNTER — Encounter: Payer: Self-pay | Admitting: Adult Health

## 2020-10-28 ENCOUNTER — Other Ambulatory Visit: Payer: Self-pay

## 2020-10-28 ENCOUNTER — Ambulatory Visit (INDEPENDENT_AMBULATORY_CARE_PROVIDER_SITE_OTHER): Payer: Medicaid Other | Admitting: Adult Health

## 2020-10-28 VITALS — BP 126/61 | HR 97 | Temp 98.1°F | Resp 15 | Wt 246.2 lb

## 2020-10-28 DIAGNOSIS — M25561 Pain in right knee: Secondary | ICD-10-CM | POA: Diagnosis not present

## 2020-10-28 DIAGNOSIS — M79642 Pain in left hand: Secondary | ICD-10-CM | POA: Diagnosis not present

## 2020-10-28 DIAGNOSIS — Z8261 Family history of arthritis: Secondary | ICD-10-CM

## 2020-10-28 DIAGNOSIS — M2560 Stiffness of unspecified joint, not elsewhere classified: Secondary | ICD-10-CM | POA: Diagnosis not present

## 2020-10-28 DIAGNOSIS — M25562 Pain in left knee: Secondary | ICD-10-CM | POA: Diagnosis not present

## 2020-10-28 MED ORDER — PREDNISONE 10 MG (21) PO TBPK: ORAL_TABLET | ORAL | 0 refills | Status: DC

## 2020-10-28 NOTE — Progress Notes (Signed)
Established patient visit   Patient: Terri [name]   DOB: September 20, 1995   25 y.o. Female  MRN: [id number] Visit Date: 10/28/2020  Today's healthcare provider: Marcille Buffy, FNP   Chief Complaint  Patient presents with  . Hand Pain   Subjective    Hand Pain  There was no injury mechanism. The pain is present in the left hand. Quality: described as stiff and sore. Radiates to: left wrist. The pain is at a severity of 8/10. Associated symptoms include muscle weakness. Pertinent negatives include no chest pain, numbness or tingling. The symptoms are aggravated by movement and lifting. She has tried acetaminophen and NSAIDs for the symptoms. The treatment provided moderate relief.   She has bilateral knee pain.  She denies any knee swelling. She reports she has had this on and off since age 54.   Her hand pain has been for around 3 weeks.    Looking back at the office labs Dr. B advised rheumatology. She has elevated non specific CRP.   Patient  denies any fever, body aches,chills, rash, chest pain, shortness of breath, nausea, vomiting, or diarrhea.  Denies dizziness, lightheadedness, pre syncopal or syncopal episodes.   She had a miscarriage at 11 weeks since seeing Dr. Jacinto Reap in January.   Patient's last menstrual period was 10/19/2020 (approximate).   Past Medical History:  Diagnosis Date  . No pertinent past medical history    Past Surgical History:  Procedure Laterality Date  . CHOLECYSTECTOMY N/A 11/04/2018   Procedure: LAPAROSCOPIC CHOLECYSTECTOMY;  Surgeon: Olean Ree, MD;  Location: [facility] ORS;  Service: General;  Laterality: N/A;  . DILATION AND EVACUATION N/A 07/13/2017   Procedure: DILATATION AND EVACUATION;  Surgeon: Ward, Honor Loh, MD;  Location: [facility] ORS;  Service: Gynecology;  Laterality: N/A;  . ERCP N/A 11/14/2018   Procedure: ENDOSCOPIC RETROGRADE CHOLANGIOPANCREATOGRAPHY (ERCP);  Surgeon: Lucilla Lame, MD;  Location: [facility] ENDOSCOPY;  Service:  Endoscopy;  Laterality: N/A;   Social History   Tobacco Use  . Smoking status: Current Every Day Smoker    Packs/day: 0.50    Years: 8.00    Pack years: 4.00    Types: Cigarettes  . Smokeless tobacco: Never Used  Vaping Use  . Vaping Use: Never used  Substance Use Topics  . Alcohol use: No  . Drug use: No   Social History   Socioeconomic History  . Marital status: Single    Spouse name: Not on file  . Number of children: 2  . Years of education: Not on file  . Highest education level: Not on file  Occupational History  . Occupation: SAHM  Tobacco Use  . Smoking status: Current Every Day Smoker    Packs/day: 0.50    Years: 8.00    Pack years: 4.00    Types: Cigarettes  . Smokeless tobacco: Never Used  Vaping Use  . Vaping Use: Never used  Substance and Sexual Activity  . Alcohol use: No  . Drug use: No  . Sexual activity: Yes    Partners: Male    Birth control/protection: None  Other Topics Concern  . Not on file  Social History Narrative  . Not on file   Social Determinants of Health   Financial Resource Strain: Not on file  Food Insecurity: Not on file  Transportation Needs: Not on file  Physical Activity: Not on file  Stress: Not on file  Social Connections: Not on file  Intimate Partner Violence: Not on file  Family Status  Relation Name Status  . Mother  Alive  . Sister  Alive  . MGM  Alive  . MGF  Deceased  . Son  Alive  . Daughter  Alive  . Neg Hx  (Not Specified)   Family History  Problem Relation Age of Onset  . Arthritis Mother   . Thyroid disease Mother   . Depression Mother   . Anxiety disorder Mother   . Anxiety disorder Maternal Grandmother   . Depression Maternal Grandmother   . COPD Maternal Grandmother   . ALS Maternal Grandfather   . Breast cancer Neg Hx   . Colon cancer Neg Hx        Medications: Outpatient Medications Prior to Visit  Medication Sig  . [DISCONTINUED] amoxicillin (AMOXIL) 500 MG tablet Take 1  tablet (500 mg total) by mouth 3 (three) times daily. (Patient not taking: Reported on 03/14/2020)  . [DISCONTINUED] clindamycin (CLEOCIN) 150 MG capsule I tab q 6 hours until finished (Patient not taking: Reported on 03/14/2020)  . [DISCONTINUED] HYDROcodone-acetaminophen (NORCO/VICODIN) 5-325 MG tablet Take 1 tablet by mouth every 6 (six) hours as needed for moderate pain. (Patient not taking: Reported on 03/14/2020)  . [DISCONTINUED] nortriptyline (PAMELOR) 10 MG capsule Take 20 mg by mouth daily. (Patient not taking: Reported on 03/14/2020)  . [DISCONTINUED] sertraline (ZOLOFT) 100 MG tablet Take 1 tablet (100 mg total) by mouth daily. (Patient not taking: Reported on 03/14/2020)  . [DISCONTINUED] topiramate (TOPAMAX) 50 MG tablet Take 1/2 tab nightly x 1 wk. Then take 1 tab nightly x 1 wk. Then take 1.5 tabs nightly x 1 wk. Then take 2 tabs nightly onward.   No facility-administered medications prior to visit.    Review of Systems  Constitutional: Negative.   HENT: Negative.   Respiratory: Negative.   Cardiovascular: Negative.  Negative for chest pain.  Genitourinary: Negative.   Musculoskeletal: Positive for arthralgias and joint swelling. Negative for back pain.  Neurological: Negative.  Negative for tingling and numbness.  Psychiatric/Behavioral: Negative.  Negative for decreased concentration.    Last CBC Lab Results  Component Value Date   WBC 10.1 10/28/2020   HGB 14.0 10/28/2020   HCT 40.3 10/28/2020   MCV 82 10/28/2020   MCH 28.5 10/28/2020   RDW 14.1 10/28/2020   PLT 205 73/22/0254   Last metabolic panel Lab Results  Component Value Date   GLUCOSE 101 (H) 10/28/2020   NA 137 10/28/2020   K 3.9 10/28/2020   CL 102 10/28/2020   CO2 20 10/28/2020   BUN 5 (L) 10/28/2020   CREATININE 0.63 10/28/2020   GFRNONAA 138 09/13/2019   GFRAA 159 09/13/2019   CALCIUM 9.2 10/28/2020   PHOS 2.7 11/14/2018   PROT 6.7 10/28/2020   ALBUMIN 4.0 10/28/2020   LABGLOB 2.7 10/28/2020    AGRATIO 1.5 10/28/2020   BILITOT 0.3 10/28/2020   ALKPHOS 77 10/28/2020   AST 16 10/28/2020   ALT 13 10/28/2020   ANIONGAP 11 03/30/2019   Last lipids No results found for: CHOL, HDL, LDLCALC, LDLDIRECT, TRIG, CHOLHDL Last hemoglobin A1c No results found for: HGBA1C Last thyroid functions Lab Results  Component Value Date   TSH 1.750 10/28/2020   Last vitamin D No results found for: 25OHVITD2, 25OHVITD3, VD25OH Last vitamin B12 and Folate No results found for: VITAMINB12, FOLATE     Objective    BP 126/61   Pulse 97   Temp 98.1 F (36.7 C) (Oral)   Resp 15   Wt  246 lb 3.2 oz (111.7 kg)   LMP 10/19/2020 (Approximate)   SpO2 96%   BMI 43.61 kg/m  BP Readings from Last 3 Encounters:  10/28/20 126/61  10/19/20 117/80  01/17/20 140/90   Wt Readings from Last 3 Encounters:  10/28/20 246 lb 3.2 oz (111.7 kg)  10/19/20 240 lb (108.9 kg)  01/17/20 175 lb (79.4 kg)       Physical Exam Vitals reviewed.  Constitutional:      General: She is not in acute distress.    Appearance: She is well-developed. She is not diaphoretic.     Interventions: She is not intubated. HENT:     Head: Normocephalic and atraumatic.     Right Ear: External ear normal.     Left Ear: External ear normal.     Nose: Nose normal.     Mouth/Throat:     Mouth: Mucous membranes are moist.     Pharynx: No oropharyngeal exudate.  Eyes:     General: Lids are normal. No scleral icterus.       Right eye: No discharge.        Left eye: No discharge.     Conjunctiva/sclera: Conjunctivae normal.     Right eye: Right conjunctiva is not injected. No exudate or hemorrhage.    Left eye: Left conjunctiva is not injected. No exudate or hemorrhage.    Pupils: Pupils are equal, round, and reactive to light.  Neck:     Thyroid: No thyroid mass or thyromegaly.     Vascular: Normal carotid pulses. No carotid bruit, hepatojugular reflux or JVD.     Trachea: Trachea and phonation normal. No tracheal  tenderness or tracheal deviation.     Meningeal: Brudzinski's sign and Kernig's sign absent.  Cardiovascular:     Rate and Rhythm: Normal rate and regular rhythm.     Pulses: Normal pulses.          Radial pulses are 2+ on the right side and 2+ on the left side.       Dorsalis pedis pulses are 2+ on the right side and 2+ on the left side.       Posterior tibial pulses are 2+ on the right side and 2+ on the left side.     Heart sounds: Normal heart sounds, S1 normal and S2 normal. Heart sounds not distant. No murmur heard. No friction rub. No gallop.   Pulmonary:     Effort: Pulmonary effort is normal. No tachypnea, bradypnea, accessory muscle usage or respiratory distress. She is not intubated.     Breath sounds: Normal breath sounds. No stridor. No wheezing, rhonchi or rales.  Chest:     Chest wall: No tenderness.  Breasts:     Right: No supraclavicular adenopathy.     Left: No supraclavicular adenopathy.    Abdominal:     General: Bowel sounds are normal. There is no distension or abdominal bruit.     Palpations: Abdomen is soft. There is no shifting dullness, fluid wave, hepatomegaly, splenomegaly, mass or pulsatile mass.     Tenderness: There is no abdominal tenderness. There is no guarding or rebound.     Hernia: No hernia is present.  Musculoskeletal:        General: Swelling and tenderness (bilateral knee ) present. No deformity (bilateral hands with stiffness mild swelling. ). Normal range of motion.     Cervical back: Full passive range of motion without pain, normal range of motion and neck supple. No edema, erythema  or rigidity. No spinous process tenderness or muscular tenderness. Normal range of motion.  Lymphadenopathy:     Head:     Right side of head: No submental, submandibular, tonsillar, preauricular, posterior auricular or occipital adenopathy.     Left side of head: No submental, submandibular, tonsillar, preauricular, posterior auricular or occipital adenopathy.      Cervical: No cervical adenopathy.     Right cervical: No superficial, deep or posterior cervical adenopathy.    Left cervical: No superficial, deep or posterior cervical adenopathy.     Upper Body:     Right upper body: No supraclavicular or pectoral adenopathy.     Left upper body: No supraclavicular or pectoral adenopathy.  Skin:    General: Skin is warm and dry.     Coloration: Skin is not pale.     Findings: No abrasion, bruising, burn, ecchymosis, erythema, lesion, petechiae or rash.     Nails: There is no clubbing.  Neurological:     Mental Status: She is alert and oriented to person, place, and time.     GCS: GCS eye subscore is 4. GCS verbal subscore is 5. GCS motor subscore is 6.     Cranial Nerves: No cranial nerve deficit.     Sensory: No sensory deficit.     Motor: No tremor, atrophy, abnormal muscle tone or seizure activity.     Coordination: Coordination normal.     Gait: Gait normal.     Deep Tendon Reflexes: Reflexes are normal and symmetric. Reflexes normal. Babinski sign absent on the right side. Babinski sign absent on the left side.     Reflex Scores:      Tricep reflexes are 2+ on the right side and 2+ on the left side.      Bicep reflexes are 2+ on the right side and 2+ on the left side.      Brachioradialis reflexes are 2+ on the right side and 2+ on the left side.      Patellar reflexes are 2+ on the right side and 2+ on the left side.      Achilles reflexes are 2+ on the right side and 2+ on the left side. Psychiatric:        Speech: Speech normal.        Behavior: Behavior normal.        Thought Content: Thought content normal.        Judgment: Judgment normal.      Results for orders placed or performed in visit on 10/28/20  CBC with Differential/Platelet  Result Value Ref Range   WBC 10.1 3.4 - 10.8 x10E3/uL   RBC 4.91 3.77 - 5.28 x10E6/uL   Hemoglobin 14.0 11.1 - 15.9 g/dL   Hematocrit 40.3 34.0 - 46.6 %   MCV 82 79 - 97 fL   MCH 28.5 26.6 -  33.0 pg   MCHC 34.7 31.5 - 35.7 g/dL   RDW 14.1 11.7 - 15.4 %   Platelets 205 150 - 450 x10E3/uL   Neutrophils 65 Not Estab. %   Lymphs 27 Not Estab. %   Monocytes 5 Not Estab. %   Eos 2 Not Estab. %   Basos 1 Not Estab. %   Neutrophils Absolute 6.5 1.4 - 7.0 x10E3/uL   Lymphocytes Absolute 2.8 0.7 - 3.1 x10E3/uL   Monocytes Absolute 0.5 0.1 - 0.9 x10E3/uL   EOS (ABSOLUTE) 0.2 0.0 - 0.4 x10E3/uL   Basophils Absolute 0.1 0.0 - 0.2 x10E3/uL   Immature  Granulocytes 0 Not Estab. %   Immature Grans (Abs) 0.0 0.0 - 0.1 x10E3/uL  Comprehensive Metabolic Panel (CMET)  Result Value Ref Range   Glucose 101 (H) 65 - 99 mg/dL   BUN 5 (L) 6 - 20 mg/dL   Creatinine, Ser 0.63 0.57 - 1.00 mg/dL   eGFR 125 >59 mL/min/1.73   BUN/Creatinine Ratio 8 (L) 9 - 23   Sodium 137 134 - 144 mmol/L   Potassium 3.9 3.5 - 5.2 mmol/L   Chloride 102 96 - 106 mmol/L   CO2 20 20 - 29 mmol/L   Calcium 9.2 8.7 - 10.2 mg/dL   Total Protein 6.7 6.0 - 8.5 g/dL   Albumin 4.0 3.9 - 5.0 g/dL   Globulin, Total 2.7 1.5 - 4.5 g/dL   Albumin/Globulin Ratio 1.5 1.2 - 2.2   Bilirubin Total 0.3 0.0 - 1.2 mg/dL   Alkaline Phosphatase 77 44 - 121 IU/L   AST 16 0 - 40 IU/L   ALT 13 0 - 32 IU/L  TSH  Result Value Ref Range   TSH 1.750 0.450 - 4.500 uIU/mL  C-reactive protein  Result Value Ref Range   CRP 18 (H) 0 - 10 mg/L  Rheumatoid factor  Result Value Ref Range   Rhuematoid fact SerPl-aCnc <10.0 <14.0 IU/mL  ANA  Result Value Ref Range   Anti Nuclear Antibody (ANA) Positive (A) Negative    Assessment & Plan    Left hand pain - Plan: DG Hand Complete Left, CBC with Differential/Platelet, Comprehensive Metabolic Panel (CMET), TSH, C-reactive protein, Rheumatoid factor, ANA  Joint stiffness - Plan: DG Knee 3 Views Left, DG Knee 3 Views Right  Arthralgia of both knees and bilateral hands.  - Plan: DG Knee 3 Views Left, DG Knee 3 Views Right, Comprehensive Metabolic Panel (CMET), TSH, C-reactive protein, Rheumatoid  factor, ANA  Family history of rheumatoid arthritis - Plan: DG Hand Complete Left, DG Knee 3 Views Left, DG Knee 3 Views Right, Comprehensive Metabolic Panel (CMET), TSH, C-reactive protein, Rheumatoid factor, ANA    The entirety of the information documented in the History of Present Illness, Review of Systems and Physical Exam were personally obtained by me. Portions of this information were initially documented by the [name] and reviewed by me for thoroughness and accuracy.   Recommend seeing rheumatologist as previously reviewed.   Return in about 2 months (around 12/26/2020), or if symptoms worsen or fail to improve, for at any time for any worsening symptoms, Go to Emergency room/ urgent care if worse.      The entirety of the information documented in the History of Present Illness, Review of Systems and Physical Exam were personally obtained by me. Portions of this information were initially documented by the [name] and reviewed by me for thoroughness and accuracy.      Marcille Buffy, [place] [phone] (phone) [phone] (fax)  [place]

## 2020-10-28 NOTE — Patient Instructions (Signed)
Joint Pain  Joint pain can be caused by many things. It is likely to go away if you follow instructions from your doctor for taking care of yourself at home. Sometimes, you may need more treatment. Follow these instructions at home: Managing pain, stiffness, and swelling  If told, put ice on the painful area. To do this: ? If you have a removable elastic bandage, sling, or splint, take it off as told by your doctor. ? Put ice in a plastic bag. ? Place a towel between your skin and the bag. ? Leave the ice on for 20 minutes, 2-3 times a day. ? Take off the ice if your skin turns bright red. This is very important. If you cannot feel pain, heat, or cold, you have a greater risk of damage to the area.  Move your fingers or toes below the painful joint often.  Raise the painful joint above the level of your heart while you are sitting or lying down.  If told, put heat on the painful area. Do this as often as told by your doctor. Use the heat source that your doctor recommends, such as a moist heat pack or a heating pad. ? Place a towel between your skin and the heat source. ? Leave the heat on for 20-30 minutes. ? Take off the heat if your skin gets bright red. This is especially important if you are unable to feel pain, heat, or cold. You may have a greater risk of getting burned.      Activity  Rest the painful joint for as long as told by your doctor. Do not do things that cause pain or make your pain worse.  Begin exercising or stretching the affected area, as told by your doctor. Ask your doctor what types of exercise are safe for you.  Return to your normal activities when your doctor says that it is safe. If you have an elastic bandage, sling, or splint:  Wear it as told by your doctor. Take it only as told by your doctor.  Loosen it your fingers or toes below the joint: ? Tingle. ? Become numb. ? Get cold and blue.  Keep it clean.  Ask your doctor if you should take it  off before bathing.  If it is not waterproof: ? Do not let it get wet. ? Cover it with a watertight covering when you take a bath or shower. General instructions  Take over-the-counter and prescription medicines only as told by your doctor. This may include medicines taken by mouth or applied to the skin.  Do not smoke or use any products that contain nicotine or tobacco. If you need help quitting, ask your doctor.  Keep all follow-up visits as told by your doctor. This is important. Contact a doctor if:  You have pain that gets worse and does not get better with medicine.  Your joint pain does not get better in 3 days.  You have more bruising or swelling.  You have a fever.  You lose 10 lb (4.5 kg) or more without trying. Get help right away if:  You cannot move the joint.  Your fingers or toes tingle, become numb. or get cold and blue.  You have a fever along with a joint that is red, warm, and swollen. Summary  Joint pain can be caused by many things. It often goes away if you follow instructions from your doctor for taking care of yourself at home.  Rest the painful joint   for as long as told. Do not do things that cause pain or make your pain worse.  Take over-the-counter and prescription medicines only as told by your doctor. This information is not intended to replace advice given to you by your health care provider. Make sure you discuss any questions you have with your health care provider. Document Revised: 11/29/2019 Document Reviewed: 11/29/2019 Elsevier Patient Education  2021 Elsevier Inc. Prednisolone tablets What is this medicine? PREDNISOLONE (pred NISS oh lone) is a corticosteroid. It is commonly used to treat inflammation of the skin, joints, lungs, and other organs. Common conditions treated include asthma, allergies, and arthritis. It is also used for other conditions, such as blood disorders and diseases of the adrenal glands. This medicine may be used  for other purposes; ask your health care provider or pharmacist if you have questions. COMMON BRAND NAME(S): Millipred, Millipred DP, Millipred DP 12-Day, Millipred DP 6 Day, Prednoral What should I tell my health care provider before I take this medicine? They need to know if you have any of these conditions:  Cushing's syndrome  diabetes  glaucoma  heart problems or disease  high blood pressure  infection such as herpes, measles, tuberculosis, or chickenpox  kidney disease  liver disease  mental problems  myasthenia gravis  osteoporosis  seizures  stomach ulcer or intestine disease including colitis and diverticulitis  thyroid problem  an unusual or allergic reaction to lactose, prednisolone, other medicines, foods, dyes, or preservatives  pregnant or trying to get pregnant  breast-feeding How should I use this medicine? Take this medicine by mouth with a glass of water. Follow the directions on the prescription label. Take it with food or milk to avoid stomach upset. If you are taking this medicine once a day, take it in the morning. Do not take more medicine than you are told to take. Do not suddenly stop taking your medicine because you may develop a severe reaction. Your doctor will tell you how much medicine to take. If your doctor wants you to stop the medicine, the dose may be slowly lowered over time to avoid any side effects. Talk to your pediatrician regarding the use of this medicine in children. Special care may be needed. Overdosage: If you think you have taken too much of this medicine contact a poison control center or emergency room at once. NOTE: This medicine is only for you. Do not share this medicine with others. What if I miss a dose? If you miss a dose, take it as soon as you can. If it is almost time for your next dose, take only that dose. Do not take double or extra doses. What may interact with this medicine? Do not take this medicine with any  of the following medications:  metyrapone  mifepristone This medicine may also interact with the following medications:  aminoglutethimide  amphotericin B  aspirin and aspirin-like medicines  barbiturates  certain medicines for diabetes, like glipizide or glyburide  cholestyramine  cholinesterase inhibitors  cyclosporine  digoxin  diuretics  ephedrine  female hormones, like estrogens and birth control pills  isoniazid  ketoconazole  NSAIDS, medicines for pain and inflammation, like ibuprofen or naproxen  phenytoin  rifampin  toxoids  vaccines  warfarin This list may not describe all possible interactions. Give your health care provider a list of all the medicines, herbs, non-prescription drugs, or dietary supplements you use. Also tell them if you smoke, drink alcohol, or use illegal drugs. Some items may interact with your  medicine. What should I watch for while using this medicine? Visit your doctor or health care professional for regular checks on your progress. If you are taking this medicine over a prolonged period, carry an identification card with your name and address, the type and dose of your medicine, and your doctor's name and address. This medicine may increase your risk of getting an infection. Tell your doctor or health care professional if you are around anyone with measles or chickenpox, or if you develop sores or blisters that do not heal properly. If you are going to have surgery, tell your doctor or health care professional that you have taken this medicine within the last twelve months. Ask your doctor or health care professional about your diet. You may need to lower the amount of salt you eat. This medicine may increase blood sugar. Ask your healthcare provider if changes in diet or medicines are needed if you have diabetes. What side effects may I notice from receiving this medicine? Side effects that you should report to your doctor or  health care professional as soon as possible:  allergic reactions like skin rash, itching or hives, swelling of the face, lips, or tongue  changes in emotions or moods  eye pain   signs and symptoms of high blood sugar such as being more thirsty or hungry or having to urinate more than normal. You may also feel very tired or have blurry vision.  signs and symptoms of infection like fever or chills; cough; sore throat; pain or trouble passing urine  slow growth in children (if used for longer periods of time)  swelling of ankles, feet  trouble sleeping  weak bones (if used for longer periods of time) Side effects that usually do not require medical attention (report to your doctor or health care professional if they continue or are bothersome):  nausea  skin problems, acne, thin and shiny skin  upset stomach  weight gain This list may not describe all possible side effects. Call your doctor for medical advice about side effects. You may report side effects to FDA at 1-800-FDA-1088. Where should I keep my medicine? Keep out of the reach of children. Store at room temperature between 15 and 30 degrees C (59 and 86 degrees F). Keep container tightly closed. Throw away any unused medicine after the expiration date. NOTE: This sheet is a summary. It may not cover all possible information. If you have questions about this medicine, talk to your doctor, pharmacist, or health care provider.  2021 Elsevier/Gold Standard (2018-05-19 10:30:56)

## 2020-10-29 ENCOUNTER — Telehealth: Payer: Self-pay

## 2020-10-29 DIAGNOSIS — R768 Other specified abnormal immunological findings in serum: Secondary | ICD-10-CM

## 2020-10-29 LAB — CBC WITH DIFFERENTIAL/PLATELET
Basophils Absolute: 0.1 10*3/uL (ref 0.0–0.2)
Basos: 1 %
EOS (ABSOLUTE): 0.2 10*3/uL (ref 0.0–0.4)
Eos: 2 %
Hematocrit: 40.3 % (ref 34.0–46.6)
Hemoglobin: 14 g/dL (ref 11.1–15.9)
Immature Grans (Abs): 0 10*3/uL (ref 0.0–0.1)
Immature Granulocytes: 0 %
Lymphocytes Absolute: 2.8 10*3/uL (ref 0.7–3.1)
Lymphs: 27 %
MCH: 28.5 pg (ref 26.6–33.0)
MCHC: 34.7 g/dL (ref 31.5–35.7)
MCV: 82 fL (ref 79–97)
Monocytes Absolute: 0.5 10*3/uL (ref 0.1–0.9)
Monocytes: 5 %
Neutrophils Absolute: 6.5 10*3/uL (ref 1.4–7.0)
Neutrophils: 65 %
Platelets: 205 10*3/uL (ref 150–450)
RBC: 4.91 x10E6/uL (ref 3.77–5.28)
RDW: 14.1 % (ref 11.7–15.4)
WBC: 10.1 10*3/uL (ref 3.4–10.8)

## 2020-10-29 LAB — COMPREHENSIVE METABOLIC PANEL
ALT: 13 IU/L (ref 0–32)
AST: 16 IU/L (ref 0–40)
Albumin/Globulin Ratio: 1.5 (ref 1.2–2.2)
Albumin: 4 g/dL (ref 3.9–5.0)
Alkaline Phosphatase: 77 IU/L (ref 44–121)
BUN/Creatinine Ratio: 8 — ABNORMAL LOW (ref 9–23)
BUN: 5 mg/dL — ABNORMAL LOW (ref 6–20)
Bilirubin Total: 0.3 mg/dL (ref 0.0–1.2)
CO2: 20 mmol/L (ref 20–29)
Calcium: 9.2 mg/dL (ref 8.7–10.2)
Chloride: 102 mmol/L (ref 96–106)
Creatinine, Ser: 0.63 mg/dL (ref 0.57–1.00)
Globulin, Total: 2.7 g/dL (ref 1.5–4.5)
Glucose: 101 mg/dL — ABNORMAL HIGH (ref 65–99)
Potassium: 3.9 mmol/L (ref 3.5–5.2)
Sodium: 137 mmol/L (ref 134–144)
Total Protein: 6.7 g/dL (ref 6.0–8.5)
eGFR: 125 mL/min/{1.73_m2} (ref >59)

## 2020-10-29 LAB — ANA: Anti Nuclear Antibody (ANA): POSITIVE — AB

## 2020-10-29 LAB — TSH: TSH: 1.75 u[IU]/mL (ref 0.450–4.500)

## 2020-10-29 LAB — C-REACTIVE PROTEIN: CRP: 18 mg/L — ABNORMAL HIGH (ref 0–10)

## 2020-10-29 LAB — RHEUMATOID FACTOR: Rheumatoid fact SerPl-aCnc: <10 IU/mL (ref <14.0)

## 2020-10-29 NOTE — Telephone Encounter (Signed)
I do not see a comment under lab results, please review and advise. KW

## 2020-10-29 NOTE — Progress Notes (Signed)
CBC within normal limits.  CMP with mildly elevated glucose otherwise within normal limits. Please add on Hemoglobin A1C.  TSH for thyroid within normal limits.  CRP is still mildly elevated, lower than previous check one year ago and can be non specific.  RH factor negative.  ANA is positive, can be non specific but can also signify autoimmune disease. Recommend referral to rheumatologist as previously discussed with PCP and at last office visit.

## 2020-10-29 NOTE — Telephone Encounter (Signed)
Please advise 

## 2020-10-29 NOTE — Telephone Encounter (Signed)
Labs resulted now, please remind patient to give [place] up to 72 hours to review lab results as discussed. See lab note.

## 2020-10-29 NOTE — Telephone Encounter (Signed)
Copied from CRM [id number]. Topic: Quick Communication - Lab Results (Clinic Use ONLY) >> Oct 29, 2020  2:03 PM Crist Infante wrote: Pt would like someone to call and go over her labs with her

## 2020-10-30 ENCOUNTER — Ambulatory Visit: Payer: Self-pay | Admitting: *Deleted

## 2020-10-30 DIAGNOSIS — M79642 Pain in left hand: Secondary | ICD-10-CM | POA: Insufficient documentation

## 2020-10-30 DIAGNOSIS — Z8261 Family history of arthritis: Secondary | ICD-10-CM | POA: Insufficient documentation

## 2020-10-30 DIAGNOSIS — M25561 Pain in right knee: Secondary | ICD-10-CM | POA: Insufficient documentation

## 2020-10-30 DIAGNOSIS — M25562 Pain in left knee: Secondary | ICD-10-CM | POA: Insufficient documentation

## 2020-10-30 NOTE — Telephone Encounter (Signed)
Returned call to review recent lab results and patient reported she just received a call and was informed of lab results. No questions reported at this time from patient.

## 2020-10-30 NOTE — Telephone Encounter (Signed)
Advised patient of lab results, will place in order for Rheumatology. KW

## 2020-10-30 NOTE — Telephone Encounter (Signed)
Pt called in requesting her lab results, please CB.

## 2020-10-31 NOTE — Progress Notes (Signed)
A1C is within normal non diabetic.

## 2020-11-07 ENCOUNTER — Ambulatory Visit: Payer: Self-pay | Admitting: *Deleted

## 2020-11-07 ENCOUNTER — Other Ambulatory Visit: Payer: Self-pay

## 2020-11-07 ENCOUNTER — Ambulatory Visit: Payer: Self-pay | Admitting: Physician Assistant

## 2020-11-07 DIAGNOSIS — Z9049 Acquired absence of other specified parts of digestive tract: Secondary | ICD-10-CM | POA: Diagnosis not present

## 2020-11-07 DIAGNOSIS — O26891 Other specified pregnancy related conditions, first trimester: Secondary | ICD-10-CM | POA: Diagnosis not present

## 2020-11-07 DIAGNOSIS — O219 Vomiting of pregnancy, unspecified: Secondary | ICD-10-CM | POA: Diagnosis not present

## 2020-11-07 DIAGNOSIS — R1013 Epigastric pain: Secondary | ICD-10-CM | POA: Diagnosis not present

## 2020-11-07 DIAGNOSIS — R109 Unspecified abdominal pain: Secondary | ICD-10-CM | POA: Diagnosis not present

## 2020-11-07 DIAGNOSIS — F17210 Nicotine dependence, cigarettes, uncomplicated: Secondary | ICD-10-CM | POA: Insufficient documentation

## 2020-11-07 DIAGNOSIS — R112 Nausea with vomiting, unspecified: Secondary | ICD-10-CM | POA: Insufficient documentation

## 2020-11-07 DIAGNOSIS — O208 Other hemorrhage in early pregnancy: Secondary | ICD-10-CM | POA: Diagnosis not present

## 2020-11-07 DIAGNOSIS — Z3201 Encounter for pregnancy test, result positive: Secondary | ICD-10-CM | POA: Diagnosis not present

## 2020-11-07 DIAGNOSIS — R030 Elevated blood-pressure reading, without diagnosis of hypertension: Secondary | ICD-10-CM | POA: Diagnosis not present

## 2020-11-07 DIAGNOSIS — Z3A01 Less than 8 weeks gestation of pregnancy: Secondary | ICD-10-CM | POA: Diagnosis not present

## 2020-11-07 LAB — COMPREHENSIVE METABOLIC PANEL
ALT: 13 U/L (ref 0–44)
AST: 13 U/L — ABNORMAL LOW (ref 15–41)
Albumin: 3.7 g/dL (ref 3.5–5.0)
Alkaline Phosphatase: 58 U/L (ref 38–126)
Anion gap: 8 (ref 5–15)
BUN: 9 mg/dL (ref 6–20)
CO2: 25 mmol/L (ref 22–32)
Calcium: 9.3 mg/dL (ref 8.9–10.3)
Chloride: 102 mmol/L (ref 98–111)
Creatinine, Ser: 0.66 mg/dL (ref 0.44–1.00)
GFR, Estimated: >60 mL/min (ref >60)
Glucose, Bld: 95 mg/dL (ref 70–99)
Potassium: 3.9 mmol/L (ref 3.5–5.1)
Sodium: 135 mmol/L (ref 135–145)
Total Bilirubin: 0.6 mg/dL (ref 0.3–1.2)
Total Protein: 7.1 g/dL (ref 6.5–8.1)

## 2020-11-07 LAB — CBC WITH DIFFERENTIAL/PLATELET
Abs Immature Granulocytes: 0.06 10*3/uL (ref 0.00–0.07)
Basophils Absolute: 0.1 10*3/uL (ref 0.0–0.1)
Basophils Relative: 0 %
Eosinophils Absolute: 0.3 10*3/uL (ref 0.0–0.5)
Eosinophils Relative: 2 %
HCT: 41.9 % (ref 36.0–46.0)
Hemoglobin: 14.6 g/dL (ref 12.0–15.0)
Immature Granulocytes: 0 %
Lymphocytes Relative: 18 %
Lymphs Abs: 2.7 10*3/uL (ref 0.7–4.0)
MCH: 28.2 pg (ref 26.0–34.0)
MCHC: 34.8 g/dL (ref 30.0–36.0)
MCV: 80.9 fL (ref 80.0–100.0)
Monocytes Absolute: 0.9 10*3/uL (ref 0.1–1.0)
Monocytes Relative: 6 %
Neutro Abs: 11 10*3/uL — ABNORMAL HIGH (ref 1.7–7.7)
Neutrophils Relative %: 74 %
Platelets: 261 10*3/uL (ref 150–400)
RBC: 5.18 MIL/uL — ABNORMAL HIGH (ref 3.87–5.11)
RDW: 13.1 % (ref 11.5–15.5)
WBC: 14.9 10*3/uL — ABNORMAL HIGH (ref 4.0–10.5)
nRBC: 0 % (ref 0.0–0.2)

## 2020-11-07 LAB — URINALYSIS, COMPLETE (UACMP) WITH MICROSCOPIC
Bacteria, UA: NONE SEEN
Bilirubin Urine: NEGATIVE
Glucose, UA: NEGATIVE mg/dL
Hgb urine dipstick: NEGATIVE
Ketones, ur: NEGATIVE mg/dL
Nitrite: NEGATIVE
Protein, ur: NEGATIVE mg/dL
Specific Gravity, Urine: 1.028 (ref 1.005–1.030)
pH: 5 (ref 5.0–8.0)

## 2020-11-07 LAB — HCG, QUANTITATIVE, PREGNANCY: hCG, Beta Chain, Quant, S: 63669 m[IU]/mL — ABNORMAL HIGH (ref <5)

## 2020-11-07 LAB — POC URINE PREG, ED: Preg Test, Ur: POSITIVE — AB

## 2020-11-07 LAB — LIPASE, BLOOD: Lipase: 24 U/L (ref 11–51)

## 2020-11-07 NOTE — ED Triage Notes (Signed)
Mid upper abd pain with nausea. Described as sharp stabbing pain and intermittent. Reports nausea without emesis. Denies diarrhea. Reports cholecystectomy 2 years ago and pain feels the same. Denies fevers at home. Denies dysuria.

## 2020-11-07 NOTE — ED Notes (Signed)
sunquest not working in triage room. Pt blood and urine specimens sent to lab with patient label stickers

## 2020-11-07 NOTE — Telephone Encounter (Signed)
  Pt called in c/o upper abd pain in middle of stomach and radiating into her back.   Began 2 days ago gradually and has gotten worse during the night.  Denies N/V/D.   It feels just like the pain she had when she had gallstones but she had her gallbladder removed 1 1/2 years ago.  I made her an in office appt for today with Joycelyn Man for 11:20.   Covid questionnaire completed.  I sent my notes to [facility] for Bergman Burnette's information.   Reason for Disposition . [1] MODERATE pain (e.g., interferes with normal activities) AND [2] pain comes and goes (cramps) AND [3] present > 24 hours  (Exception: pain with Vomiting or Diarrhea - see that Guideline)  Answer Assessment - Initial Assessment Questions 1. LOCATION: "Where does it hurt?"      Above my belly buttoon in the center of my stomach and radiating to my back. 2. RADIATION: "Does the pain shoot anywhere else?" (e.g., chest, back)     To my back.    I've had my gallblaadder rremoved a yr ago.   This pain is identical.2 3. ONSET: "When did the pain begin?" (e.g., minutes, hours or days ago)      Began mildly 2 days ago.  Worse last night 4. SUDDEN: "Gradual or sudden onset?"     Better when sitting up and leaning forward.   Started gradually. 5. PATTERN "Does the pain come and go, or is it constant?"    - If constant: "Is it getting better, staying the same, or worsening?"      (Note: Constant means the pain never goes away completely; most serious pain is constant and it progresses)     - If intermittent: "How long does it last?" "Do you have pain now?"     (Note: Intermittent means the pain goes away completely between bouts)     Constant 6. SEVERITY: "How bad is the pain?"  (e.g., Scale 1-10; mild, moderate, or severe)   - MILD (1-3): doesn't interfere with normal activities, abdomen soft and not tender to touch    - MODERATE (4-7): interferes with normal activities or awakens from sleep, tender to  touch    - SEVERE (8-10): excruciating pain, doubled over, unable to do any normal activities      8 7. RECURRENT SYMPTOM: "Have you ever had this type of stomach pain before?" If Yes, ask: "When was the last time?" and "What happened that time?"      Had pain before with gallstones. 8. CAUSE: "What do you think is causing the stomach pain?"     I have no idea.  9. RELIEVING/AGGRAVATING FACTORS: "What makes it better or worse?" (e.g., movement, antacids, bowel movement)     Eating does not help.   10. OTHER SYMPTOMS: "Has there been any vomiting, diarrhea, constipation, or urine problems?"       No N/D/V 11. PREGNANCY: "Is there any chance you are pregnant?" "When was your last menstrual period?"       No  Protocols used: ABDOMINAL PAIN - [facility]

## 2020-11-08 ENCOUNTER — Emergency Department
Admission: EM | Admit: 2020-11-08 | Discharge: 2020-11-08 | Disposition: A | Discharge status: Home / Self Care | Payer: Medicaid Other | Attending: Student in an Organized Health Care Education/Training Program | Admitting: Student in an Organized Health Care Education/Training Program

## 2020-11-08 ENCOUNTER — Emergency Department: Payer: Medicaid Other

## 2020-11-08 ENCOUNTER — Encounter: Payer: Self-pay | Admitting: Radiology

## 2020-11-08 DIAGNOSIS — Z3201 Encounter for pregnancy test, result positive: Secondary | ICD-10-CM | POA: Diagnosis not present

## 2020-11-08 DIAGNOSIS — O26891 Other specified pregnancy related conditions, first trimester: Secondary | ICD-10-CM | POA: Diagnosis not present

## 2020-11-08 DIAGNOSIS — R1013 Epigastric pain: Secondary | ICD-10-CM

## 2020-11-08 DIAGNOSIS — R112 Nausea with vomiting, unspecified: Secondary | ICD-10-CM

## 2020-11-08 DIAGNOSIS — Z3A01 Less than 8 weeks gestation of pregnancy: Secondary | ICD-10-CM | POA: Diagnosis not present

## 2020-11-08 DIAGNOSIS — R109 Unspecified abdominal pain: Secondary | ICD-10-CM | POA: Diagnosis not present

## 2020-11-08 DIAGNOSIS — R1030 Lower abdominal pain, unspecified: Secondary | ICD-10-CM

## 2020-11-08 DIAGNOSIS — O208 Other hemorrhage in early pregnancy: Secondary | ICD-10-CM | POA: Diagnosis not present

## 2020-11-08 HISTORY — DX: Anxiety disorder, unspecified: F41.9

## 2020-11-08 MED ORDER — ONDANSETRON 4 MG PO TBDP
4.0000 mg | ORAL_TABLET | Freq: Once | ORAL | Status: AC
  Administered 2020-11-08: 4 mg via ORAL
  Filled 2020-11-08: qty 1

## 2020-11-08 MED ORDER — ONDANSETRON 4 MG PO TBDP: 4.0000 mg | ORAL_TABLET | Freq: Three times a day (TID) | ORAL | 0 refills | Status: DC | PRN

## 2020-11-08 MED ORDER — ACETAMINOPHEN 325 MG PO TABS
650.0000 mg | ORAL_TABLET | Freq: Once | ORAL | Status: AC
  Administered 2020-11-08: 650 mg via ORAL
  Filled 2020-11-08: qty 2

## 2020-11-08 NOTE — ED Notes (Signed)
PT denies having nausea at this time after being PO challenged

## 2020-11-08 NOTE — ED Notes (Signed)
Pt PO challenged at this time.

## 2020-11-08 NOTE — ED Provider Notes (Addendum)
[facility] Emergency Department Provider Note    Event Date/Time   First MD Initiated Contact with Patient 11/08/20 0340     (approximate)  I have reviewed the triage vital signs and the nursing notes.   HISTORY  Chief Complaint Abdominal Pain    HPI Terri [name] is a 25 y.o. female below listed past medical history presents to the ER for evaluation of sharp stabbing intermittent epigastric pain associate with some nausea vomiting.  She is status post cholecystectomy.  States the pain felt similar.  No pain radiating through to her back.  Denies any dysuria.  No vaginal bleeding or discharge.  No measured fevers.    Past Medical History:  Diagnosis Date  . Anxiety   . No pertinent past medical history    Family History  Problem Relation Age of Onset  . Arthritis Mother   . Thyroid disease Mother   . Depression Mother   . Anxiety disorder Mother   . Anxiety disorder Maternal Grandmother   . Depression Maternal Grandmother   . COPD Maternal Grandmother   . ALS Maternal Grandfather   . Breast cancer Neg Hx   . Colon cancer Neg Hx    Past Surgical History:  Procedure Laterality Date  . CHOLECYSTECTOMY N/A 11/04/2018   Procedure: LAPAROSCOPIC CHOLECYSTECTOMY;  Surgeon: Henrene DodgePiscoya, Jose, MD;  Location: [facility] ORS;  Service: General;  Laterality: N/A;  . DILATION AND EVACUATION N/A 07/13/2017   Procedure: DILATATION AND EVACUATION;  Surgeon: Ward, Elenora Fenderhelsea C, MD;  Location: [facility] ORS;  Service: Gynecology;  Laterality: N/A;  . ERCP N/A 11/14/2018   Procedure: ENDOSCOPIC RETROGRADE CHOLANGIOPANCREATOGRAPHY (ERCP);  Surgeon: Midge MiniumWohl, Darren, MD;  [facility];  Service: Endoscopy;  Laterality: N/A;   Patient Active Problem List   Diagnosis Date Noted  . Left hand pain 10/30/2020  . Arthralgia of both knees and bilateral hands.  10/30/2020  . Family history of rheumatoid arthritis 10/30/2020  . Headache disorder 10/30/2019  . Polyarthralgia  09/13/2019  . Joint stiffness 09/13/2019  . GAD (generalized anxiety disorder) 01/16/2019  . Current moderate episode of major depressive disorder without prior episode (HCC) 01/16/2019  . PTSD (post-traumatic stress disorder) 01/16/2019  . Tobacco use disorder 01/16/2019  . Cholestatic jaundice 11/13/2018      Prior to Admission medications   Medication Sig Start Date End Date Taking? Authorizing Provider  ondansetron (ZOFRAN ODT) 4 MG disintegrating tablet Take 1 tablet (4 mg total) by mouth every 8 (eight) hours as needed for nausea or vomiting. 11/08/20  Yes Willy Eddyobinson, Brogan England, MD  predniSONE (STERAPRED UNI-PAK 21 TAB) 10 MG (21) TBPK tablet PO: Take 6 tablets on day 1:Take 5 tablets day 2:Take 4 tablets day 3: Take 3 tablets day 4:Take 2 tablets day five: 5 Take 1 tablet day 6 10/28/20   Flinchum, Eula FriedMichelle S, FNP    Allergies Sulfa antibiotics and Zithromax [azithromycin]    Social History Social History   Tobacco Use  . Smoking status: Current Every Day Smoker    Packs/day: 0.50    Years: 8.00    Pack years: 4.00    Types: Cigarettes  . Smokeless tobacco: Never Used  Vaping Use  . Vaping Use: Never used  Substance Use Topics  . Alcohol use: No  . Drug use: No    Review of Systems Patient denies headaches, rhinorrhea, blurry vision, numbness, shortness of breath, chest pain, edema, cough, abdominal pain, nausea, vomiting, diarrhea, dysuria, fevers, rashes or hallucinations unless otherwise stated  above in HPI. ____________________________________________   PHYSICAL EXAM:  VITAL SIGNS: Vitals:   11/08/20 0031 11/08/20 0348  BP: (!) 137/95 (!) 125/95  Pulse: 73 70  Resp: 18 18  Temp:    SpO2: 100% 99%    Constitutional: Alert and oriented.  Eyes: Conjunctivae are normal.  Head: Atraumatic. Nose: No congestion/rhinnorhea. Mouth/Throat: Mucous membranes are moist.   Neck: No stridor. Painless ROM.  Cardiovascular: Normal rate, regular rhythm. Grossly normal  heart sounds.  Good peripheral circulation. Respiratory: Normal respiratory effort.  No retractions. Lungs CTAB. Gastrointestinal: Soft and nontender in all four quadrants. No distention. No abdominal bruits. No CVA tenderness. Genitourinary: deferred Musculoskeletal: No lower extremity tenderness nor edema.  No joint effusions. Neurologic:  Normal speech and language. No gross focal neurologic deficits are appreciated. No facial droop Skin:  Skin is warm, dry and intact. No rash noted. Psychiatric: Mood and affect are normal. Speech and behavior are normal.  ____________________________________________   LABS (all labs ordered are listed, but only abnormal results are displayed)  Results for orders placed or performed during the hospital encounter of 11/08/20 (from the past 24 hour(s))  CBC with Differential     Status: Abnormal   Collection Time: 11/07/20  9:47 PM  Result Value Ref Range   WBC 14.9 (H) 4.0 - 10.5 K/uL   RBC 5.18 (H) 3.87 - 5.11 MIL/uL   Hemoglobin 14.6 12.0 - 15.0 g/dL   HCT 39.7 67.3 - 41.9 %   MCV 80.9 80.0 - 100.0 fL   MCH 28.2 26.0 - 34.0 pg   MCHC 34.8 30.0 - 36.0 g/dL   RDW 37.9 02.4 - 09.7 %   Platelets 261 150 - 400 K/uL   nRBC 0.0 0.0 - 0.2 %   Neutrophils Relative % 74 %   Neutro Abs 11.0 (H) 1.7 - 7.7 K/uL   Lymphocytes Relative 18 %   Lymphs Abs 2.7 0.7 - 4.0 K/uL   Monocytes Relative 6 %   Monocytes Absolute 0.9 0.1 - 1.0 K/uL   Eosinophils Relative 2 %   Eosinophils Absolute 0.3 0.0 - 0.5 K/uL   Basophils Relative 0 %   Basophils Absolute 0.1 0.0 - 0.1 K/uL   Immature Granulocytes 0 %   Abs Immature Granulocytes 0.06 0.00 - 0.07 K/uL  Comprehensive metabolic panel     Status: Abnormal   Collection Time: 11/07/20  9:47 PM  Result Value Ref Range   Sodium 135 135 - 145 mmol/L   Potassium 3.9 3.5 - 5.1 mmol/L   Chloride 102 98 - 111 mmol/L   CO2 25 22 - 32 mmol/L   Glucose, Bld 95 70 - 99 mg/dL   BUN 9 6 - 20 mg/dL   Creatinine, Ser 3.53  0.44 - 1.00 mg/dL   Calcium 9.3 8.9 - 29.9 mg/dL   Total Protein 7.1 6.5 - 8.1 g/dL   Albumin 3.7 3.5 - 5.0 g/dL   AST 13 (L) 15 - 41 U/L   ALT 13 0 - 44 U/L   Alkaline Phosphatase 58 38 - 126 U/L   Total Bilirubin 0.6 0.3 - 1.2 mg/dL   GFR, Estimated >24 >26 mL/min   Anion gap 8 5 - 15  Lipase, blood     Status: None   Collection Time: 11/07/20  9:47 PM  Result Value Ref Range   Lipase 24 11 - 51 U/L  Urinalysis, Complete w Microscopic     Status: Abnormal   Collection Time: 11/07/20  9:47 PM  Result Value Ref Range   Color, Urine YELLOW (A) YELLOW   APPearance CLOUDY (A) CLEAR   Specific Gravity, Urine 1.028 1.005 - 1.030   pH 5.0 5.0 - 8.0   Glucose, UA NEGATIVE NEGATIVE mg/dL   Hgb urine dipstick NEGATIVE NEGATIVE   Bilirubin Urine NEGATIVE NEGATIVE   Ketones, ur NEGATIVE NEGATIVE mg/dL   Protein, ur NEGATIVE NEGATIVE mg/dL   Nitrite NEGATIVE NEGATIVE   Leukocytes,Ua SMALL (A) NEGATIVE   RBC / HPF 0-5 0 - 5 RBC/hpf   WBC, UA 6-10 0 - 5 WBC/hpf   Bacteria, UA NONE SEEN NONE SEEN   Squamous Epithelial / LPF 0-5 0 - 5   Mucus PRESENT   hCG, quantitative, pregnancy     Status: Abnormal   Collection Time: 11/07/20  9:47 PM  Result Value Ref Range   hCG, Beta Chain, Quant, S 63,669 (H) <5 mIU/mL  POC Urine Pregnancy, ED     Status: Abnormal   Collection Time: 11/07/20  9:52 PM  Result Value Ref Range   Preg Test, Ur Positive (A) Negative   ____________________________________________ ED ECG REPORT I, Willy Eddy, the attending physician, personally viewed and interpreted this ECG.   Date: 11/07/2020  EKG Time: 21:43  Rate: 70  Rhythm: sinus  Axis: normal  Intervals:normal  ST&T Change: no stemi or depressions  ____________________________________________  RADIOLOGY  I personally reviewed all radiographic images ordered to evaluate for the above acute complaints and reviewed radiology reports and findings.  These findings were personally discussed with the  patient.  Please see medical record for radiology report.  ____________________________________________   PROCEDURES  Procedure(s) performed:  Procedures    Critical Care performed: no ____________________________________________   INITIAL IMPRESSION / ASSESSMENT AND PLAN / ED COURSE  Pertinent labs & imaging results that were available during my care of the patient were reviewed by me and considered in my medical decision making (see chart for details).   DDX: Enteritis, gastritis, pancreatitis, pregnancy, UTI, appendicitis  Terri [name] is a 25 y.o. who presents to the ED with presentation as described above.  Work-up is largely reassuring patient incidentally found to be pregnant.  Ultrasound shows IUP.  Right upper quadrant ultrasound is benign.  Remainder of abdominal exam is soft and benign does not seem consistent with appendicitis.  Do not feel that CT imaging clinically indicated.  She is denying any dysuria.  Noted to have subchorionic hemorrhage on ultrasound patient is Rh+ and is not describing any bleeding at this time.  She is given oral Zofran with improvement in symptoms and is tolerating p.o.  Do believe she stable appropriate for outpatient follow-up.     The patient was evaluated in Emergency Department today for the symptoms described in the history of present illness. He/she was evaluated in the context of the global COVID-19 pandemic, which necessitated consideration that the patient might be at risk for infection with the SARS-CoV-2 virus that causes COVID-19. Institutional protocols and algorithms that pertain to the evaluation of patients at risk for COVID-19 are in a state of rapid change based on information released by regulatory bodies including the CDC and federal and state organizations. These policies and algorithms were followed during the patient's care in the ED.  As part of my medical decision making, I reviewed the following data within the  electronic MEDICAL RECORD NUMBER Nursing notes reviewed and incorporated, Labs reviewed, notes from prior ED visits and [place] Controlled Substance Database   ____________________________________________   FINAL CLINICAL  IMPRESSION(S) / ED DIAGNOSES  Final diagnoses:  Epigastric pain  Non-intractable vomiting with nausea, unspecified vomiting type      NEW MEDICATIONS STARTED DURING THIS VISIT:  New Prescriptions   ONDANSETRON (ZOFRAN ODT) 4 MG DISINTEGRATING TABLET    Take 1 tablet (4 mg total) by mouth every 8 (eight) hours as needed for nausea or vomiting.     Note:  This document was prepared using Dragon voice recognition software and may include unintentional dictation errors.    Willy Eddy, MD 11/08/20 8242    Willy Eddy, MD 12/03/20 [phone]

## 2020-11-08 NOTE — Discharge Instructions (Signed)

## 2020-11-11 LAB — HGB A1C W/O EAG: Hgb A1c MFr Bld: 4.8 % (ref 4.8–5.6)

## 2020-11-11 LAB — SPECIMEN STATUS REPORT

## 2020-12-16 NOTE — Progress Notes (Deleted)
Office Visit Note  Patient: Terri [name]             Date of Birth: 01/26/1996           MRN: [id number]             PCP: Erasmo Downer, MD Referring: Stephanie Acre* Visit Date: 12/27/2020 Occupation: @GUAROCC @  Subjective:  No chief complaint on file.   History of Present Illness: JENNICA TAGLIAFERRI is a 25 y.o. female ***   Activities of Daily Living:  Patient reports morning stiffness for *** {minute/hour:19697}.   Patient {ACTIONS;DENIES/REPORTS:21021675::"Denies"} nocturnal pain.  Difficulty dressing/grooming: {ACTIONS;DENIES/REPORTS:21021675::"Denies"} Difficulty climbing stairs: {ACTIONS;DENIES/REPORTS:21021675::"Denies"} Difficulty getting out of chair: {ACTIONS;DENIES/REPORTS:21021675::"Denies"} Difficulty using hands for taps, buttons, cutlery, and/or writing: {ACTIONS;DENIES/REPORTS:21021675::"Denies"}  No Rheumatology ROS completed.   PMFS History:  Patient Active Problem List   Diagnosis Date Noted  . Left hand pain 10/30/2020  . Arthralgia of both knees and bilateral hands.  10/30/2020  . Family history of rheumatoid arthritis 10/30/2020  . Headache disorder 10/30/2019  . Polyarthralgia 09/13/2019  . Joint stiffness 09/13/2019  . GAD (generalized anxiety disorder) 01/16/2019  . Current moderate episode of major depressive disorder without prior episode (HCC) 01/16/2019  . PTSD (post-traumatic stress disorder) 01/16/2019  . Tobacco use disorder 01/16/2019  . Cholestatic jaundice 11/13/2018    Past Medical History:  Diagnosis Date  . Anxiety   . No pertinent past medical history     Family History  Problem Relation Age of Onset  . Arthritis Mother   . Thyroid disease Mother   . Depression Mother   . Anxiety disorder Mother   . Anxiety disorder Maternal Grandmother   . Depression Maternal Grandmother   . COPD Maternal Grandmother   . ALS Maternal Grandfather   . Breast cancer Neg Hx   . Colon cancer Neg Hx    Past  Surgical History:  Procedure Laterality Date  . CHOLECYSTECTOMY N/A 11/04/2018   Procedure: LAPAROSCOPIC CHOLECYSTECTOMY;  Surgeon: 01/04/2019, MD;  Location: [facility] ORS;  Service: General;  Laterality: N/A;  . DILATION AND EVACUATION N/A 07/13/2017   Procedure: DILATATION AND EVACUATION;  Surgeon: Ward, 07/15/2017, MD;  Location: [facility] ORS;  Service: Gynecology;  Laterality: N/A;  . ERCP N/A 11/14/2018   Procedure: ENDOSCOPIC RETROGRADE CHOLANGIOPANCREATOGRAPHY (ERCP);  Surgeon: 11/16/2018, MD;  Location[facility]COPY;  Service: Endoscopy;  Laterality: N/A;   Social History   Social History Narrative  . Not on file    There is no immunization history on file for this patient.   Objective: Vital Signs: LMP 10/19/2020 (Approximate)    Physical Exam   Musculoskeletal Exam: ***  CDAI Exam: CDAI Score: -- Patient Global: --; Provider Global: -- Swollen: --; Tender: -- Joint Exam 12/27/2020   No joint exam has been documented for this visit   There is currently no information documented on the homunculus. Go to the Rheumatology activity and complete the homunculus joint exam.  Investigation: No additional findings.  Imaging: No results found.  Recent Labs: Lab Results  Component Value Date   WBC 14.9 (H) 11/07/2020   HGB 14.6 11/07/2020   PLT 261 11/07/2020   NA 135 11/07/2020   K 3.9 11/07/2020   CL 102 11/07/2020   CO2 25 11/07/2020   GLUCOSE 95 11/07/2020   BUN 9 11/07/2020   CREATININE 0.66 11/07/2020   BILITOT 0.6 11/07/2020   ALKPHOS 58 11/07/2020   AST 13 (L) 11/07/2020   ALT 13 11/07/2020  PROT 7.1 11/07/2020   ALBUMIN 3.7 11/07/2020   CALCIUM 9.3 11/07/2020   GFRAA 159 09/13/2019    Speciality Comments: No specialty comments available.  Procedures:  No procedures performed Allergies: Sulfa antibiotics and Zithromax [azithromycin]   Assessment / Plan:     Visit Diagnoses: Positive ANA (antinuclear antibody) - 10/28/20: ANA positive, no titer,  RF<10, CRP 18, TSH 1.750  Joint stiffness  Polyarthralgia  Family history of rheumatoid arthritis  Cholestatic jaundice  Current moderate episode of major depressive disorder without prior episode (HCC)  PTSD (post-traumatic stress disorder)  Tobacco use disorder  GAD (generalized anxiety disorder)  Orders: No orders of the defined types were placed in this encounter.  No orders of the defined types were placed in this encounter.   Face-to-face time spent with patient was *** minutes. Greater than 50% of time was spent in counseling and coordination of care.  Follow-Up Instructions: No follow-ups on file.   Gearldine Bienenstock, PA-C  Note - This record has been created using Dragon software.  Chart creation errors have been sought, but may not always  have been located. Such creation errors do not reflect on  the standard of medical care.

## 2020-12-24 ENCOUNTER — Other Ambulatory Visit: Payer: Self-pay

## 2020-12-24 ENCOUNTER — Ambulatory Visit (INDEPENDENT_AMBULATORY_CARE_PROVIDER_SITE_OTHER): Payer: Medicaid Other | Admitting: Obstetrics and Gynecology

## 2020-12-24 ENCOUNTER — Encounter: Payer: Self-pay | Admitting: Obstetrics and Gynecology

## 2020-12-24 VITALS — BP 113/81 | HR 82 | Ht 63.0 in | Wt 238.3 lb

## 2020-12-24 DIAGNOSIS — Z3492 Encounter for supervision of normal pregnancy, unspecified, second trimester: Secondary | ICD-10-CM

## 2020-12-24 DIAGNOSIS — Z7689 Persons encountering health services in other specified circumstances: Secondary | ICD-10-CM

## 2020-12-24 NOTE — Progress Notes (Signed)
HPI:      [name] is a 25 y.o. [id number] who LMP was Patient's last menstrual period was 10/19/2020 (approximate).  Subjective:   She presents today after being seen in the emergency department and having an ultrasound which reveals her to be 13 weeks estimated gestational age.  She presents today to establish care for her pregnancy.  She is also complaining of some wheezing and headaches that she says may have to do with allergies.  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  She is not having any issues with nausea and vomiting of pregnancy. She has had 2 prior vaginal births. She has not been vaccinated for COVID.    Hx: The following portions of the patient's history were reviewed and updated as appropriate:             She  has a past medical history of Anxiety and No pertinent past medical history. She does not have any pertinent problems on file. She  has a past surgical history that includes Dilation and evacuation (N/A, 07/13/2017); Cholecystectomy (N/A, 11/04/2018); and ERCP (N/A, 11/14/2018). Her family history includes ALS in her maternal grandfather; Anxiety disorder in her maternal grandmother and mother; Arthritis in her mother; COPD in her maternal grandmother; Depression in her maternal grandmother and mother; Thyroid disease in her mother. She  reports that she has been smoking cigarettes. She has a 4.00 pack-year smoking history. She has never used smokeless tobacco. She reports that she does not drink alcohol and does not use drugs. She has a current medication list which includes the following prescription(s): ondansetron. She is allergic to sulfa antibiotics and zithromax [azithromycin].       Review of Systems:  Review of Systems  Constitutional: Denied constitutional symptoms, night sweats, recent illness, fatigue, fever, insomnia and weight loss.  Eyes: Denied eye symptoms, eye pain, photophobia, vision change and visual disturbance.   Ears/Nose/Throat/Neck: Denied ear, nose, throat or neck symptoms, hearing loss, nasal discharge, sinus congestion and sore throat.  Cardiovascular: Denied cardiovascular symptoms, arrhythmia, chest pain/pressure, edema, exercise intolerance, orthopnea and palpitations.  Respiratory: Denied pulmonary symptoms, asthma, pleuritic pain, productive sputum, cough, dyspnea and wheezing.  Gastrointestinal: Denied, gastro-esophageal reflux, melena, nausea and vomiting.  Genitourinary: Denied genitourinary symptoms including symptomatic vaginal discharge, pelvic relaxation issues, and urinary complaints.  Musculoskeletal: Denied musculoskeletal symptoms, stiffness, swelling, muscle weakness and myalgia.  Dermatologic: Denied dermatology symptoms, rash and scar.  Neurologic: Denied neurology symptoms, dizziness, headache, neck pain and syncope.  Psychiatric: Denied psychiatric symptoms, anxiety and depression.  Endocrine: Denied endocrine symptoms including hot flashes and night sweats.   Meds:   Current Outpatient Medications on File Prior to Visit  Medication Sig Dispense Refill  . ondansetron (ZOFRAN ODT) 4 MG disintegrating tablet Take 1 tablet (4 mg total) by mouth every 8 (eight) hours as needed for nausea or vomiting. (Patient not taking: Reported on 12/24/2020) 8 tablet 0   No current facility-administered medications on file prior to visit.          Objective:     Vitals:   12/24/20 0735  BP: 113/81  Pulse: 82   Filed Weights   12/24/20 0735  Weight: 238 lb 4.8 oz (108.1 kg)                Assessment:    [id number] Patient Active Problem List   Diagnosis Date Noted  . Left hand pain 10/30/2020  . Arthralgia of both knees and bilateral hands.  10/30/2020  . Family history of rheumatoid arthritis 10/30/2020  . Headache disorder 10/30/2019  . Polyarthralgia 09/13/2019  . Joint stiffness 09/13/2019  . GAD (generalized anxiety disorder) 01/16/2019  . Current moderate episode  of major depressive disorder without prior episode (HCC) 01/16/2019  . PTSD (post-traumatic stress disorder) 01/16/2019  . Tobacco use disorder 01/16/2019  . 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 Prenatal care in second trimester   2. Encounter to establish care     Patient approximately 13 weeks estimated gestational age based on emergency department ultrasound.   Plan:            Prenatal Plan 1.  The patient was given prenatal literature. 2.  She was begun on prenatal vitamins. 3.  A prenatal lab panel to be drawn at nurse visit. 4.  An ultrasound was ordered to better determine an EDC. 5.  A nurse visit was scheduled. 6.  Genetic testing and testing for other inheritable conditions discussed in detail. She will decide in the future whether to have these labs performed. 7.  A general overview of pregnancy testing, visit schedule, ultrasound schedule, and prenatal care was discussed. 8.  COVID and its risks associated with pregnancy, prevention by limiting exposure and use of masks, as well as the risks and benefits of vaccination during pregnancy were discussed in detail.  [facility] policy regarding office and hospital visitation and testing was explained. 9.  Benefits of breast-feeding discussed in detail including both maternal and infant benefits. Ready Set Baby website discussed. 10.  Advised use of Claritin Zyrtec Flonase as needed for allergic symptoms of headache and wheezing. 11.  Blood work today -for prenatal lab panel -patient desires MaterniT 21   Orders Orders Placed This Encounter  Procedures  . Culture, OB Urine  . GC/Chlamydia Probe Amp  . ABO AND RH   . CBC with Differential/Platelet  . HIV Antibody (routine testing w rflx)  . Monitor Drug Profile 14(MW)  . Nicotine screen, urine  . RPR  . Rubella screen  . Urinalysis, Routine w reflex microscopic  . Varicella zoster antibody, IgG  . Viral Hepatitis HBV, HCV  . MaterniT21  plus Core+ESS+SCA, Blood  .  Antibody screen    No orders of the defined types were placed in this encounter.     F/U  Return in about 2 weeks (around 01/07/2021). I spent 33 minutes involved in the care of this patient preparing to see the patient by obtaining and reviewing her medical history (including labs, imaging tests and prior procedures), documenting clinical information in the electronic health record (EHR), counseling and coordinating care plans, writing and sending prescriptions, ordering tests or procedures and directly communicating with the patient by discussing pertinent items from her history and physical exam as well as detailing my assessment and plan as noted above so that she has an informed understanding.  All of her questions were answered.  Elonda Husky, M.D. 12/24/2020 8:12 AM

## 2020-12-25 LAB — VIRAL HEPATITIS HBV, HCV
HCV Ab: <0.1 s/co ratio (ref 0.0–0.9)
Hep B Core Total Ab: NEGATIVE
Hep B Surface Ab, Qual: NONREACTIVE
Hepatitis B Surface Ag: NEGATIVE

## 2020-12-25 LAB — CBC WITH DIFFERENTIAL/PLATELET
Basophils Absolute: 0 10*3/uL (ref 0.0–0.2)
Basos: 0 %
EOS (ABSOLUTE): 0.3 10*3/uL (ref 0.0–0.4)
Eos: 2 %
Hematocrit: 40.4 % (ref 34.0–46.6)
Hemoglobin: 13.4 g/dL (ref 11.1–15.9)
Immature Grans (Abs): 0 10*3/uL (ref 0.0–0.1)
Immature Granulocytes: 0 %
Lymphocytes Absolute: 2.7 10*3/uL (ref 0.7–3.1)
Lymphs: 23 %
MCH: 28.2 pg (ref 26.6–33.0)
MCHC: 33.2 g/dL (ref 31.5–35.7)
MCV: 85 fL (ref 79–97)
Monocytes Absolute: 0.6 10*3/uL (ref 0.1–0.9)
Monocytes: 5 %
Neutrophils Absolute: 8.4 10*3/uL — ABNORMAL HIGH (ref 1.4–7.0)
Neutrophils: 70 %
Platelets: 181 10*3/uL (ref 150–450)
RBC: 4.75 x10E6/uL (ref 3.77–5.28)
RDW: 14.1 % (ref 11.7–15.4)
WBC: 12.1 10*3/uL — ABNORMAL HIGH (ref 3.4–10.8)

## 2020-12-25 LAB — URINALYSIS, ROUTINE W REFLEX MICROSCOPIC
Bilirubin, UA: NEGATIVE
Glucose, UA: NEGATIVE
Ketones, UA: NEGATIVE
Nitrite, UA: POSITIVE — AB
RBC, UA: NEGATIVE
Specific Gravity, UA: 1.027 (ref 1.005–1.030)
Urobilinogen, Ur: 0.2 mg/dL (ref 0.2–1.0)
pH, UA: 6 (ref 5.0–7.5)

## 2020-12-25 LAB — RUBELLA SCREEN: Rubella Antibodies, IGG: 1.23 index (ref >0.99)

## 2020-12-25 LAB — ANTIBODY SCREEN: Antibody Screen: NEGATIVE

## 2020-12-25 LAB — ABO AND RH: Rh Factor: POSITIVE

## 2020-12-25 LAB — MICROSCOPIC EXAMINATION
Casts: NONE SEEN /lpf
RBC, Urine: NONE SEEN /hpf (ref 0–2)

## 2020-12-25 LAB — VARICELLA ZOSTER ANTIBODY, IGG: Varicella zoster IgG: 663 index (ref >165)

## 2020-12-25 LAB — HIV ANTIBODY (ROUTINE TESTING W REFLEX): HIV Screen 4th Generation wRfx: NONREACTIVE

## 2020-12-25 LAB — RPR: RPR Ser Ql: NONREACTIVE

## 2020-12-25 LAB — HCV INTERPRETATION

## 2020-12-26 LAB — GC/CHLAMYDIA PROBE AMP
Chlamydia trachomatis, NAA: NEGATIVE
Neisseria Gonorrhoeae by PCR: NEGATIVE

## 2020-12-27 ENCOUNTER — Telehealth: Payer: Self-pay | Admitting: Obstetrics and Gynecology

## 2020-12-27 ENCOUNTER — Ambulatory Visit: Payer: Medicaid Other | Admitting: Rheumatology

## 2020-12-27 DIAGNOSIS — Z8261 Family history of arthritis: Secondary | ICD-10-CM

## 2020-12-27 DIAGNOSIS — M2560 Stiffness of unspecified joint, not elsewhere classified: Secondary | ICD-10-CM

## 2020-12-27 DIAGNOSIS — R768 Other specified abnormal immunological findings in serum: Secondary | ICD-10-CM

## 2020-12-27 DIAGNOSIS — R17 Unspecified jaundice: Secondary | ICD-10-CM

## 2020-12-27 DIAGNOSIS — F4310 Post-traumatic stress disorder, unspecified: Secondary | ICD-10-CM

## 2020-12-27 DIAGNOSIS — F321 Major depressive disorder, single episode, moderate: Secondary | ICD-10-CM

## 2020-12-27 DIAGNOSIS — F411 Generalized anxiety disorder: Secondary | ICD-10-CM

## 2020-12-27 DIAGNOSIS — F17200 Nicotine dependence, unspecified, uncomplicated: Secondary | ICD-10-CM

## 2020-12-27 DIAGNOSIS — M2550 Pain in unspecified joint: Secondary | ICD-10-CM

## 2020-12-27 LAB — URINE CULTURE, OB REFLEX

## 2020-12-27 LAB — CULTURE, OB URINE

## 2020-12-27 MED ORDER — NITROFURANTOIN MONOHYD MACRO 100 MG PO CAPS: 100.0000 mg | ORAL_CAPSULE | Freq: Two times a day (BID) | ORAL | 0 refills | Status: DC

## 2020-12-27 NOTE — Telephone Encounter (Signed)
Sent patient in Macrobid and notified patient that it has been sent in.

## 2020-12-27 NOTE — Telephone Encounter (Signed)
Patient called states that she saw lab results in her MyChart showing positive for UTI- patient is requesting medications be sent to her pharmacy. Confirmed pharmacy as [facility] on [address]  Please Advise.

## 2020-12-29 LAB — MATERNIT21  PLUS CORE+ESS+SCA, BLOOD

## 2021-01-08 ENCOUNTER — Encounter: Payer: Self-pay | Admitting: Obstetrics and Gynecology

## 2021-01-09 ENCOUNTER — Encounter: Payer: Medicaid Other | Admitting: Obstetrics and Gynecology

## 2021-01-12 LAB — MONITOR DRUG PROFILE 14(MW)
Amphetamine Scrn, Ur: NEGATIVE ng/mL
BARBITURATE SCREEN URINE: NEGATIVE ng/mL
BENZODIAZEPINE SCREEN, URINE: NEGATIVE ng/mL
Buprenorphine, Urine: NEGATIVE ng/mL
Cocaine (Metab) Scrn, Ur: NEGATIVE ng/mL
Creatinine(Crt), U: 230.1 mg/dL (ref 20.0–300.0)
Fentanyl, Urine: NEGATIVE pg/mL
Meperidine Screen, Urine: NEGATIVE ng/mL
Methadone Screen, Urine: NEGATIVE ng/mL
OXYCODONE+OXYMORPHONE UR QL SCN: NEGATIVE ng/mL
Opiate Scrn, Ur: NEGATIVE ng/mL
Ph of Urine: 5.9 (ref 4.5–8.9)
Phencyclidine Qn, Ur: NEGATIVE ng/mL
Propoxyphene Scrn, Ur: NEGATIVE ng/mL
SPECIFIC GRAVITY: 1.026
Tramadol Screen, Urine: NEGATIVE ng/mL

## 2021-01-12 LAB — CANNABINOID (GC/MS), URINE
Cannabinoid: POSITIVE — AB
Carboxy THC (GC/MS): 16 ng/mL

## 2021-01-12 LAB — NICOTINE SCREEN, URINE: Cotinine Ql Scrn, Ur: POSITIVE ng/mL — AB

## 2021-01-15 ENCOUNTER — Encounter: Payer: Medicaid Other | Admitting: Obstetrics and Gynecology

## 2021-01-15 NOTE — Progress Notes (Deleted)
Office Visit Note  Patient: Terri [name]             Date of Birth: Sep 26, 1995           MRN: [id number]             PCP: Erasmo Downer, MD Referring: Erasmo Downer, MD Visit Date: 01/21/2021 Occupation: @GUAROCC @  Subjective:  No chief complaint on file.   History of Present Illness: Terri [name] is a 25 y.o. female ***   Activities of Daily Living:  Patient reports morning stiffness for *** {minute/hour:19697}.   Patient {ACTIONS;DENIES/REPORTS:21021675::"Denies"} nocturnal pain.  Difficulty dressing/grooming: {ACTIONS;DENIES/REPORTS:21021675::"Denies"} Difficulty climbing stairs: {ACTIONS;DENIES/REPORTS:21021675::"Denies"} Difficulty getting out of chair: {ACTIONS;DENIES/REPORTS:21021675::"Denies"} Difficulty using hands for taps, buttons, cutlery, and/or writing: {ACTIONS;DENIES/REPORTS:21021675::"Denies"}  No Rheumatology ROS completed.   PMFS History:  Patient Active Problem List   Diagnosis Date Noted  . Left hand pain 10/30/2020  . Arthralgia of both knees and bilateral hands.  10/30/2020  . Family history of rheumatoid arthritis 10/30/2020  . Headache disorder 10/30/2019  . Polyarthralgia 09/13/2019  . Joint stiffness 09/13/2019  . GAD (generalized anxiety disorder) 01/16/2019  . Current moderate episode of major depressive disorder without prior episode (HCC) 01/16/2019  . PTSD (post-traumatic stress disorder) 01/16/2019  . Tobacco use disorder 01/16/2019  . Cholestatic jaundice 11/13/2018    Past Medical History:  Diagnosis Date  . Anxiety   . No pertinent past medical history     Family History  Problem Relation Age of Onset  . Arthritis Mother   . Thyroid disease Mother   . Depression Mother   . Anxiety disorder Mother   . Anxiety disorder Maternal Grandmother   . Depression Maternal Grandmother   . COPD Maternal Grandmother   . ALS Maternal Grandfather   . Breast cancer Neg Hx   . Colon cancer Neg Hx    Past  Surgical History:  Procedure Laterality Date  . CHOLECYSTECTOMY N/A 11/04/2018   Procedure: LAPAROSCOPIC CHOLECYSTECTOMY;  Surgeon: 01/04/2019, MD;  Location: [facility] ORS;  Service: General;  Laterality: N/A;  . DILATION AND EVACUATION N/A 07/13/2017   Procedure: DILATATION AND EVACUATION;  Surgeon: Ward, 07/15/2017, MD;  Location: [facility] ORS;  Service: Gynecology;  Laterality: N/A;  . ERCP N/A 11/14/2018   Procedure: ENDOSCOPIC RETROGRADE CHOLANGIOPANCREATOGRAPHY (ERCP);  Surgeon: 11/16/2018, MD;  Location: [facility] ENDOSCOPY;  Service: Endoscopy;  Laterality: N/A;   Social History   Social History Narrative  . Not on file    There is no immunization history on file for this patient.   Objective: Vital Signs: LMP 10/19/2020 (Approximate)    Physical Exam   Musculoskeletal Exam: ***  CDAI Exam: CDAI Score: -- Patient Global: --; Provider Global: -- Swollen: --; Tender: -- Joint Exam 01/21/2021   No joint exam has been documented for this visit   There is currently no information documented on the homunculus. Go to the Rheumatology activity and complete the homunculus joint exam.  Investigation: No additional findings.  Imaging: No results found.  Recent Labs: Lab Results  Component Value Date   WBC 12.1 (H) 12/24/2020   HGB 13.4 12/24/2020   PLT 181 12/24/2020   NA 135 11/07/2020   K 3.9 11/07/2020   CL 102 11/07/2020   CO2 25 11/07/2020   GLUCOSE 95 11/07/2020   BUN 9 11/07/2020   CREATININE 0.66 11/07/2020   BILITOT 0.6 11/07/2020   ALKPHOS 58 11/07/2020   AST 13 (L) 11/07/2020   ALT 13 11/07/2020  PROT 7.1 11/07/2020   ALBUMIN 3.7 11/07/2020   CALCIUM 9.3 11/07/2020   GFRAA 159 09/13/2019    Speciality Comments: No specialty comments available.  Procedures:  No procedures performed Allergies: Sulfa antibiotics and Zithromax [azithromycin]   Assessment / Plan:     Visit Diagnoses: No diagnosis found.  Orders: No orders of the defined types were  placed in this encounter.  No orders of the defined types were placed in this encounter.   Face-to-face time spent with patient was *** minutes. Greater than 50% of time was spent in counseling and coordination of care.  Follow-Up Instructions: No follow-ups on file.   [name], MD  Note - This record has been created using Animal nutritionist.  Chart creation errors have been sought, but may not always  have been located. Such creation errors do not reflect on  the standard of medical care.

## 2021-01-17 ENCOUNTER — Other Ambulatory Visit: Payer: Self-pay

## 2021-01-17 ENCOUNTER — Ambulatory Visit (INDEPENDENT_AMBULATORY_CARE_PROVIDER_SITE_OTHER): Payer: Medicaid Other | Admitting: Obstetrics and Gynecology

## 2021-01-17 VITALS — BP 111/73 | HR 102 | Resp 16 | Ht 63.0 in | Wt 230.6 lb

## 2021-01-17 DIAGNOSIS — Z113 Encounter for screening for infections with a predominantly sexual mode of transmission: Secondary | ICD-10-CM

## 2021-01-17 DIAGNOSIS — Z3A17 17 weeks gestation of pregnancy: Secondary | ICD-10-CM

## 2021-01-17 DIAGNOSIS — Z6841 Body Mass Index (BMI) 40.0 and over, adult: Secondary | ICD-10-CM | POA: Diagnosis not present

## 2021-01-17 DIAGNOSIS — Z3402 Encounter for supervision of normal first pregnancy, second trimester: Secondary | ICD-10-CM | POA: Diagnosis not present

## 2021-01-17 DIAGNOSIS — T7589XA Other specified effects of external causes, initial encounter: Secondary | ICD-10-CM

## 2021-01-17 NOTE — Progress Notes (Signed)
Therisa Doyne Smet presents for NOB nurse interview visit. Pregnancy confirmation done on 11/08/2020 by transvaginal ultrasound. IMPRESSION: Single living intrauterine gestation with an estimated gestational age of [redacted] weeks, 2 days.  [id number]. Pregnancy education material explained and given. _2_ cats in the home. NOB labs ordered. (TSH/HbgA1c due to Increased BMI. Body mass index is 40.85 kg/m.  HIV labs and Drug screen were explained optional and she did not decline. Drug screen ordered. PNV encouraged. Genetic screening options discussed. Genetic testing: Has been done and resulted. She has medicaid policy does not apply. FMLA from reviewed and signed. Pt. To follow up with provider in Dr . Valentino Saxon for NOB PE on 01/21/2021 .  All questions answered.

## 2021-01-17 NOTE — Patient Instructions (Signed)
https://www.cdc.gov/pregnancy/infections.html">  First Trimester of Pregnancy  The first trimester of pregnancy starts on the first day of your last menstrual period until the end of week 12. This is also called months 1 through 3 of pregnancy. Body changes during your first trimester Your body goes through many changes during pregnancy. The changes usually return to normal after your baby is born. Physical changes  You may gain or lose weight.  Your breasts may grow larger and hurt. The area around your nipples may get darker.  Dark spots or blotches may develop on your face.  You may have changes in your hair. Health changes  You may feel like you might vomit (nauseous), and you may vomit.  You may have heartburn.  You may have headaches.  You may have trouble pooping (constipation).  Your gums may bleed. Other changes  You may get tired easily.  You may pee (urinate) more often.  Your menstrual periods will stop.  You may not feel hungry.  You may want to eat certain kinds of food.  You may have changes in your emotions from day to day.  You may have more dreams. Follow these instructions at home: Medicines  Take over-the-counter and prescription medicines only as told by your doctor. Some medicines are not safe during pregnancy.  Take a prenatal vitamin that contains at least 600 micrograms (mcg) of folic acid. Eating and drinking  Eat healthy meals that include: ? Fresh fruits and vegetables. ? Whole grains. ? Good sources of protein, such as meat, eggs, or tofu. ? Low-fat dairy products.  Avoid raw meat and unpasteurized juice, milk, and cheese.  If you feel like you may vomit, or you vomit: ? Eat 4 or 5 small meals a day instead of 3 large meals. ? Try eating a few soda crackers. ? Drink liquids between meals instead of during meals.  You may need to take these actions to prevent or treat trouble pooping: ? Drink enough fluids to keep your pee  (urine) pale yellow. ? Eat foods that are high in fiber. These include beans, whole grains, and fresh fruits and vegetables. ? Limit foods that are high in fat and sugar. These include fried or sweet foods. Activity  Exercise only as told by your doctor. Most people can do their usual exercise routine during pregnancy.  Stop exercising if you have cramps or pain in your lower belly (abdomen) or low back.  Do not exercise if it is too hot or too humid, or if you are in a place of great height (high altitude).  Avoid heavy lifting.  If you choose to, you may have sex unless your doctor tells you not to. Relieving pain and discomfort  Wear a good support bra if your breasts are sore.  Rest with your legs raised (elevated) if you have leg cramps or low back pain.  If you have bulging veins (varicose veins) in your legs: ? Wear support hose as told by your doctor. ? Raise your feet for 15 minutes, 3-4 times a day. ? Limit salt in your food. Safety  Wear your seat belt at all times when you are in a car.  Talk with your doctor if someone is hurting you or yelling at you.  Talk with your doctor if you are feeling sad or have thoughts of hurting yourself. Lifestyle  Do not use hot tubs, steam rooms, or saunas.  Do not douche. Do not use tampons or scented sanitary pads.  Do not   use herbal medicines, illegal drugs, or medicines that are not approved by your doctor. Do not drink alcohol.  Do not smoke or use any products that contain nicotine or tobacco. If you need help quitting, ask your doctor.  Avoid cat litter boxes and soil that is used by cats. These carry germs that can cause harm to the baby and can cause a loss of your baby by miscarriage or stillbirth. General instructions  Keep all follow-up visits. This is important.  Ask for help if you need counseling or if you need help with nutrition. Your doctor can give you advice or tell you where to go for help.  Visit your  dentist. At home, brush your teeth with a soft toothbrush. Floss gently.  Write down your questions. Take them to your prenatal visits. Where to find more information  American Pregnancy Association: americanpregnancy.org  [employer]: www.acog.org  Office on Women's Health: MightyReward.co.nz Contact a doctor if:  You are dizzy.  You have a fever.  You have mild cramps or pressure in your lower belly.  You have a nagging pain in your belly area.  You continue to feel like you may vomit, you vomit, or you have watery poop (diarrhea) for 24 hours or longer.  You have a bad-smelling fluid coming from your vagina.  You have pain when you pee.  You are exposed to a disease that spreads from person to person, such as chickenpox, measles, Zika virus, HIV, or hepatitis. Get help right away if:  You have spotting or bleeding from your vagina.  You have very bad belly cramping or pain.  You have shortness of breath or chest pain.  You have any kind of injury, such as from a fall or a car crash.  You have new or increased pain, swelling, or redness in an arm or leg. Summary  The first trimester of pregnancy starts on the first day of your last menstrual period until the end of week 12 (months 1 through 3).  Eat 4 or 5 small meals a day instead of 3 large meals.  Do not smoke or use any products that contain nicotine or tobacco. If you need help quitting, ask your doctor.  Keep all follow-up visits. This information is not intended to replace advice given to you by your health care provider. Make sure you discuss any questions you have with your health care provider. Document Revised: 01/24/2020 Document Reviewed: 11/30/2019 Elsevier Patient Education  2021 Elsevier Inc. Commonly Asked Questions During Pregnancy  Cats: A parasite can be excreted in cat feces.  To avoid exposure you need to have another person empty the little  box.  If you must empty the litter box you will need to wear gloves.  Wash your hands after handling your cat.  This parasite can also be found in raw or undercooked meat so this should also be avoided.  Colds, Sore Throats, Flu: Please check your medication sheet to see what you can take for symptoms.  If your symptoms are unrelieved by these medications please call the office.  Dental Work: Most any dental work Agricultural consultant recommends is permitted.  X-rays should only be taken during the first trimester if absolutely necessary.  Your abdomen should be shielded with a lead apron during all x-rays.  Please notify your provider prior to receiving any x-rays.  Novocaine is fine; gas is not recommended.  If your dentist requires a note from [place] prior to dental work  please call the office and we will provide one for you.  Exercise: Exercise is an important part of staying healthy during your pregnancy.  You may continue most exercises you were accustomed to prior to pregnancy.  Later in your pregnancy you will most likely notice you have difficulty with activities requiring balance like riding a bicycle.  It is important that you listen to your body and avoid activities that put you at a higher risk of falling.  Adequate rest and staying well hydrated are a must!  If you have questions about the safety of specific activities ask your provider.    Exposure to Children with illness: Try to avoid obvious exposure; report any symptoms to [place] when noted,  If you have chicken pos, red measles or mumps, you should be immune to these diseases.   Please do not take any vaccines while pregnant unless you have checked with your OB provider.  Fetal Movement: After 28 weeks we recommend you do "kick counts" twice daily.  Lie or sit down in a calm quiet environment and count your baby movements "kicks".  You should feel your baby at least 10 times per hour.  If you have not felt 10 kicks within the first hour get up, walk  around and have something sweet to eat or drink then repeat for an additional hour.  If count remains less than 10 per hour notify your provider.  Fumigating: Follow your pest control agent's advice as to how long to stay out of your home.  Ventilate the area well before re-entering.  Hemorrhoids:   Most over-the-counter preparations can be used during pregnancy.  Check your medication to see what is safe to use.  It is important to use a stool softener or fiber in your diet and to drink lots of liquids.  If hemorrhoids seem to be getting worse please call the office.   Hot Tubs:  Hot tubs Jacuzzis and saunas are not recommended while pregnant.  These increase your internal body temperature and should be avoided.  Intercourse:  Sexual intercourse is safe during pregnancy as long as you are comfortable, unless otherwise advised by your provider.  Spotting may occur after intercourse; report any bright red bleeding that is heavier than spotting.  Labor:  If you know that you are in labor, please go to the hospital.  If you are unsure, please call the office and let us help you decide what to do.  Lifting, straining, etc:  If your job requires heavy lifting or straining please check with your provider for any limitations.  Generally, you should not lift items heavier than that you can lift simply with your hands and arms (no back muscles)  Painting:  Paint fumes do not harm your pregnancy, but may make you ill and should be avoided if possible.  Latex or water based paints have less odor than oils.  Use adequate ventilation while painting.  Permanents & Hair Color:  Chemicals in hair dyes are not recommended as they cause increase hair dryness which can increase hair loss during pregnancy.  " Highlighting" and permanents are allowed.  Dye may be absorbed differently and permanents may not hold as well during pregnancy.  Sunbathing:  Use a sunscreen, as skin burns easily during pregnancy.  Drink plenty  of fluids; avoid over heating.  Tanning Beds:  Because their possible side effects are still unknown, tanning beds are not recommended.  Ultrasound Scans:  Routine ultrasounds are performed at approximately 20 weeks.  You will be able to see your baby's general anatomy an if you would like to know the gender this can usually be determined as well.  If it is questionable when you conceived you may also receive an ultrasound early in your pregnancy for dating purposes.  Otherwise ultrasound exams are not routinely performed unless there is a medical necessity.  Although you can request a scan we ask that you pay for it when conducted because insurance does not cover " patient request" scans.  Work: If your pregnancy proceeds without complications you may work until your due date, unless your physician or employer advises otherwise.  Round Ligament Pain/Pelvic Discomfort:  Sharp, shooting pains not associated with bleeding are fairly common, usually occurring in the second trimester of pregnancy.  They tend to be worse when standing up or when you remain standing for long periods of time.  These are the result of pressure of certain pelvic ligaments called "round ligaments".  Rest, Tylenol and heat seem to be the most effective relief.  As the womb and fetus grow, they rise out of the pelvis and the discomfort improves.  Please notify the office if your pain seems different than that described.  It may represent a more serious condition.  [facility] Pediatrician List   [facility]  [address] [place], [place], [place] [address]  Phone: [phone]    Pediatrics (second location)  [address] [place], [place] [address]  Phone: [phone]   [facility]) [address] [place], [place], [place] [address] Phone: [phone]   [facility]  [address], [place], [place] [address]  Phone: [phone]

## 2021-01-18 LAB — VIRAL HEPATITIS HBV, HCV
HCV Ab: <0.1 s/co ratio (ref 0.0–0.9)
Hep B Core Total Ab: NEGATIVE
Hep B Surface Ab, Qual: NONREACTIVE
Hepatitis B Surface Ag: NEGATIVE

## 2021-01-18 LAB — URINALYSIS, ROUTINE W REFLEX MICROSCOPIC
Bilirubin, UA: NEGATIVE
Glucose, UA: NEGATIVE
Ketones, UA: NEGATIVE
Leukocytes,UA: NEGATIVE
Nitrite, UA: NEGATIVE
Protein,UA: NEGATIVE
RBC, UA: NEGATIVE
Specific Gravity, UA: 1.024 (ref 1.005–1.030)
Urobilinogen, Ur: 0.2 mg/dL (ref 0.2–1.0)
pH, UA: 6 (ref 5.0–7.5)

## 2021-01-18 LAB — ABO AND RH: Rh Factor: POSITIVE

## 2021-01-18 LAB — HCV INTERPRETATION

## 2021-01-18 LAB — TOXOPLASMA ANTIBODIES- IGG AND  IGM
Toxoplasma Antibody- IgM: <3 AU/mL (ref 0.0–7.9)
Toxoplasma IgG Ratio: <3 IU/mL (ref 0.0–7.1)

## 2021-01-18 LAB — ANTIBODY SCREEN: Antibody Screen: NEGATIVE

## 2021-01-18 LAB — HIV ANTIBODY (ROUTINE TESTING W REFLEX): HIV Screen 4th Generation wRfx: NONREACTIVE

## 2021-01-18 LAB — RUBELLA SCREEN: Rubella Antibodies, IGG: 1.14 index (ref >0.99)

## 2021-01-18 LAB — RPR: RPR Ser Ql: NONREACTIVE

## 2021-01-18 LAB — VARICELLA ZOSTER ANTIBODY, IGG: Varicella zoster IgG: 236 index (ref >165)

## 2021-01-19 LAB — URINE CULTURE, OB REFLEX

## 2021-01-19 LAB — CULTURE, OB URINE

## 2021-01-20 LAB — MONITOR DRUG PROFILE 14(MW)
Amphetamine Scrn, Ur: NEGATIVE ng/mL
BARBITURATE SCREEN URINE: NEGATIVE ng/mL
BENZODIAZEPINE SCREEN, URINE: NEGATIVE ng/mL
Buprenorphine, Urine: NEGATIVE ng/mL
CANNABINOIDS UR QL SCN: NEGATIVE ng/mL
Cocaine (Metab) Scrn, Ur: NEGATIVE ng/mL
Creatinine(Crt), U: 198.6 mg/dL (ref 20.0–300.0)
Fentanyl, Urine: NEGATIVE pg/mL
Meperidine Screen, Urine: NEGATIVE ng/mL
Methadone Screen, Urine: NEGATIVE ng/mL
OXYCODONE+OXYMORPHONE UR QL SCN: NEGATIVE ng/mL
Opiate Scrn, Ur: NEGATIVE ng/mL
Ph of Urine: 6.3 (ref 4.5–8.9)
Phencyclidine Qn, Ur: NEGATIVE ng/mL
Propoxyphene Scrn, Ur: NEGATIVE ng/mL
SPECIFIC GRAVITY: 1.026
Tramadol Screen, Urine: NEGATIVE ng/mL

## 2021-01-20 LAB — GC/CHLAMYDIA PROBE AMP
Chlamydia trachomatis, NAA: NEGATIVE
Neisseria Gonorrhoeae by PCR: NEGATIVE

## 2021-01-20 LAB — NICOTINE SCREEN, URINE: Cotinine Ql Scrn, Ur: POSITIVE ng/mL — AB

## 2021-01-21 ENCOUNTER — Encounter: Payer: Self-pay | Admitting: Obstetrics and Gynecology

## 2021-01-21 ENCOUNTER — Ambulatory Visit (INDEPENDENT_AMBULATORY_CARE_PROVIDER_SITE_OTHER): Payer: Medicaid Other | Admitting: Obstetrics and Gynecology

## 2021-01-21 ENCOUNTER — Other Ambulatory Visit: Payer: Self-pay

## 2021-01-21 ENCOUNTER — Ambulatory Visit: Payer: Medicaid Other | Admitting: Rheumatology

## 2021-01-21 VITALS — BP 110/64 | HR 89 | Wt 232.6 lb

## 2021-01-21 DIAGNOSIS — Z3A17 17 weeks gestation of pregnancy: Secondary | ICD-10-CM

## 2021-01-21 DIAGNOSIS — Z6841 Body Mass Index (BMI) 40.0 and over, adult: Secondary | ICD-10-CM

## 2021-01-21 DIAGNOSIS — Z3492 Encounter for supervision of normal pregnancy, unspecified, second trimester: Secondary | ICD-10-CM

## 2021-01-21 LAB — POCT URINALYSIS DIPSTICK OB
Bilirubin, UA: NEGATIVE
Blood, UA: NEGATIVE
Glucose, UA: NEGATIVE
Ketones, UA: NEGATIVE
Leukocytes, UA: NEGATIVE
Nitrite, UA: NEGATIVE
POC,PROTEIN,UA: NEGATIVE
Spec Grav, UA: 1.01 (ref 1.010–1.025)
Urobilinogen, UA: 0.2 E.U./dL
pH, UA: 6 (ref 5.0–8.0)

## 2021-01-21 NOTE — Progress Notes (Signed)
NOB: No nausea and vomiting.  Patient feels well.  Taking vitamins as directed.  Will begin 81 mg ASA.  aFP today.  Anatomy screen 3 to 4 weeks.  Is beginning to feel fetal movement.  Physical examination General NAD, Conversant  HEENT Atraumatic; Op clear with mmm.  Normo-cephalic. Pupils reactive. Anicteric sclerae  Thyroid/Neck Smooth without nodularity or enlargement. Normal ROM.  Neck Supple.  Skin No rashes, lesions or ulceration. Normal palpated skin turgor. No nodularity.  Breasts: No masses or discharge.  Symmetric.  No axillary adenopathy.  Lungs: Clear to auscultation.No rales or wheezes. Normal Respiratory effort, no retractions.  Heart: NSR.  No murmurs or rubs appreciated. No periferal edema  Abdomen: Soft.  Non-tender.  No masses.  No HSM. No hernia  Extremities: Moves all appropriately.  Normal ROM for age. No lymphadenopathy.  Neuro: Oriented to PPT.  Normal mood. Normal affect.     Pelvic:   Vulva: Normal appearance.  No lesions.  Vagina: No lesions or abnormalities noted.  Support: Normal pelvic support.  Urethra No masses tenderness or scarring.  Meatus Normal size without lesions or prolapse.  Cervix: Normal appearance.  No lesions.  Anus: Normal exam.  No lesions.  Perineum: Normal exam.  No lesions.        Bimanual   Adnexae: No masses.  Non-tender to palpation.  Uterus: Enlarged. 17wks Pos FHTs  Non-tender.  Mobile.  AV.  Adnexae: No masses.  Non-tender to palpation.  Cul-de-sac: Negative for abnormality.  Adnexae: No masses.  Non-tender to palpation.         Pelvimetry   Diagonal: Reached.  Spines: Average.  Sacrum: Concave.  Pubic Arch: Normal.

## 2021-01-21 NOTE — Addendum Note (Signed)
Addended by: Dorian Pod on: 01/21/2021 03:34 PM   Modules accepted: Orders

## 2021-01-22 LAB — TSH: TSH: 0.995 u[IU]/mL (ref 0.450–4.500)

## 2021-01-22 LAB — SPECIMEN STATUS REPORT

## 2021-01-22 LAB — HEMOGLOBIN A1C
Est. average glucose Bld gHb Est-mCnc: 88 mg/dL
Hgb A1c MFr Bld: 4.7 % — ABNORMAL LOW (ref 4.8–5.6)

## 2021-01-23 LAB — AFP, SERUM, OPEN SPINA BIFIDA
AFP MoM: 0.79
AFP Value: 24.1 ng/mL
Gest. Age on Collection Date: 17 weeks
Maternal Age At EDD: 27.2 yr
OSBR Risk 1 IN: 10000
Test Results:: NEGATIVE
Weight: 232 [lb_av]

## 2021-01-27 ENCOUNTER — Encounter: Payer: Self-pay | Admitting: Obstetrics and Gynecology

## 2021-01-28 ENCOUNTER — Telehealth: Payer: Self-pay | Admitting: Obstetrics and Gynecology

## 2021-01-28 NOTE — Telephone Encounter (Signed)
Patient called stating that she is [redacted] weeks pregnant and she had a tick removed from her butt one week ago- the spot is now swollen, red, bruised and painful. Patient is wanting to know if she needs antibiotics or what she needs to do. Please Advise.

## 2021-02-04 ENCOUNTER — Encounter: Payer: Medicaid Other | Admitting: Obstetrics and Gynecology

## 2021-02-14 ENCOUNTER — Ambulatory Visit: Payer: Medicaid Other

## 2021-02-14 ENCOUNTER — Ambulatory Visit (HOSPITAL_COMMUNITY): Payer: Medicaid Other

## 2021-02-18 ENCOUNTER — Encounter: Payer: Medicaid Other | Admitting: Obstetrics and Gynecology

## 2021-02-21 IMAGING — DX CHEST  1 VIEW
1 series · 1 of 1 positions shown · non-contrast
Comparison: 10/28/2018

CLINICAL DATA: Left lower quadrant abdominal pain, nausea/vomiting

EXAM:
CHEST  1 VIEW

[chest ap]
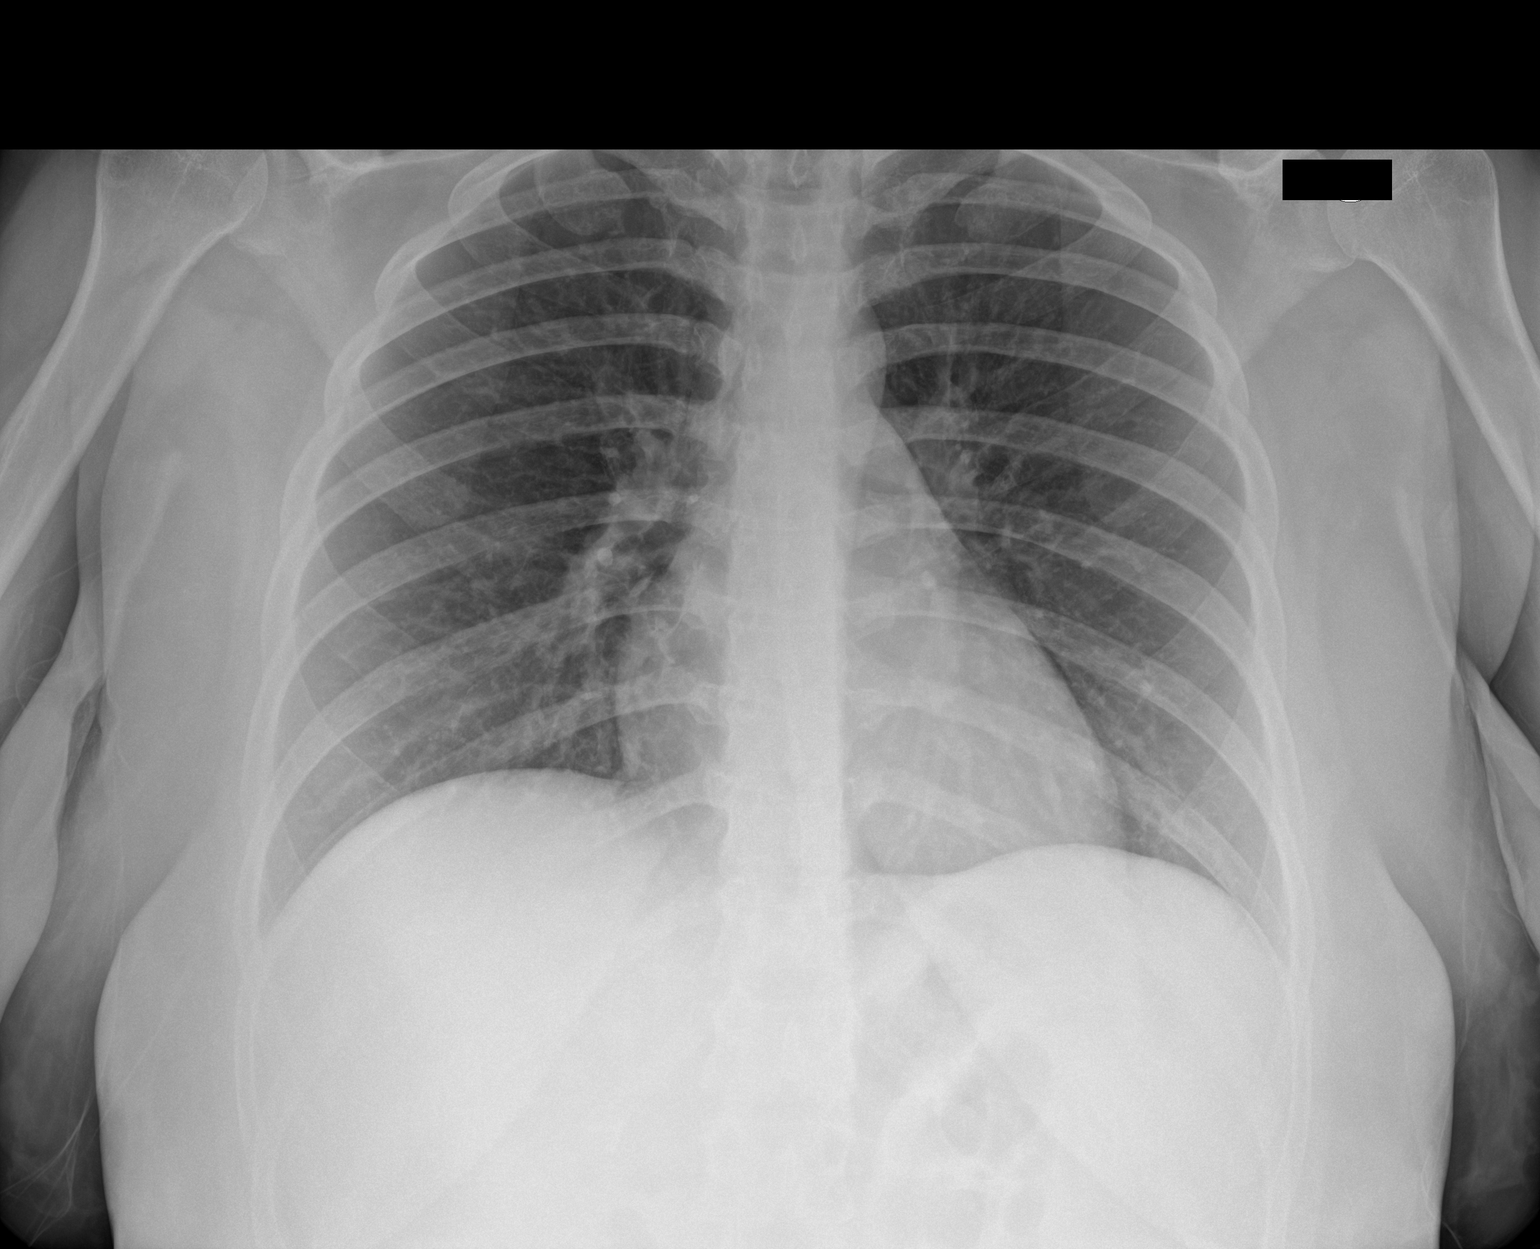

[1 of 1 positions shown; findings below may reference images not displayed]

FINDINGS: Lungs are clear.  No pleural effusion or pneumothorax.

The heart is normal in size.
IMPRESSION: No evidence of acute cardiopulmonary disease.

## 2021-03-07 ENCOUNTER — Other Ambulatory Visit: Payer: Self-pay

## 2021-03-07 ENCOUNTER — Ambulatory Visit
Admission: RE | Admit: 2021-03-07 | Discharge: 2021-03-07 | Disposition: A | Discharge status: Home / Self Care | Payer: Medicaid Other | Source: Ambulatory Visit | Attending: Obstetrics and Gynecology | Admitting: Obstetrics and Gynecology

## 2021-03-07 DIAGNOSIS — Z3A00 Weeks of gestation of pregnancy not specified: Secondary | ICD-10-CM | POA: Diagnosis not present

## 2021-03-07 DIAGNOSIS — Z3A17 17 weeks gestation of pregnancy: Secondary | ICD-10-CM | POA: Insufficient documentation

## 2021-03-07 DIAGNOSIS — Z3689 Encounter for other specified antenatal screening: Secondary | ICD-10-CM | POA: Insufficient documentation

## 2021-03-07 DIAGNOSIS — Z3492 Encounter for supervision of normal pregnancy, unspecified, second trimester: Secondary | ICD-10-CM | POA: Diagnosis not present

## 2021-03-29 ENCOUNTER — Telehealth: Payer: Medicaid Other | Admitting: Nurse Practitioner

## 2021-03-29 ENCOUNTER — Ambulatory Visit
Admission: EM | Admit: 2021-03-29 | Discharge: 2021-03-29 | Disposition: A | Discharge status: Home / Self Care | Payer: Medicaid Other | Attending: Family Medicine | Admitting: Family Medicine

## 2021-03-29 DIAGNOSIS — N3090 Cystitis, unspecified without hematuria: Secondary | ICD-10-CM | POA: Diagnosis not present

## 2021-03-29 DIAGNOSIS — R42 Dizziness and giddiness: Secondary | ICD-10-CM | POA: Diagnosis not present

## 2021-03-29 DIAGNOSIS — M545 Low back pain, unspecified: Secondary | ICD-10-CM | POA: Insufficient documentation

## 2021-03-29 LAB — POCT URINALYSIS DIP (MANUAL ENTRY)
Bilirubin, UA: NEGATIVE
Blood, UA: NEGATIVE
Glucose, UA: NEGATIVE mg/dL
Ketones, POC UA: NEGATIVE mg/dL
Leukocytes, UA: NEGATIVE
Nitrite, UA: POSITIVE — AB
Protein Ur, POC: NEGATIVE mg/dL
Spec Grav, UA: 1.02 (ref 1.010–1.025)
Urobilinogen, UA: 0.2 E.U./dL
pH, UA: 7 (ref 5.0–8.0)

## 2021-03-29 MED ORDER — CEPHALEXIN 500 MG PO CAPS: 500.0000 mg | ORAL_CAPSULE | Freq: Three times a day (TID) | ORAL | 0 refills | Status: DC

## 2021-03-29 NOTE — ED Triage Notes (Addendum)
Patient presents to Urgent Care with complaints of lower back pain, breast tenderness about 2-3 days ago and intermittent dizziness since yesterday  Spoke with her PCP who instructed her to come to UC. She is concerned with possible mastitis.    Denies fever, abdominal pain, vaginal bleeding, no changes in vision, or urinary symptoms.

## 2021-03-29 NOTE — Progress Notes (Signed)
[name], Terri [name] are scheduled for a virtual visit with your provider today.    Just as we do with appointments in the office, we must obtain your consent to participate.  Your consent will be active for this visit and any virtual visit you may have with one of our providers in the next 365 days.    If you have a MyChart account, I can also send a copy of this consent to you electronically.  All virtual visits are billed to your insurance company just like a traditional visit in the office.  As this is a virtual visit, video technology does not allow for your provider to perform a traditional examination.  This may limit your provider's ability to fully assess your condition.  If your provider identifies any concerns that need to be evaluated in person or the need to arrange testing such as labs, EKG, etc, we will make arrangements to do so.    Although advances in technology are sophisticated, we cannot ensure that it will always work on either your end or our end.  If the connection with a video visit is poor, we may have to switch to a telephone visit.  With either a video or telephone visit, we are not always able to ensure that we have a secure connection.   I need to obtain your verbal consent now.   Are you willing to proceed with your visit today?   Terri [name] has provided verbal consent on 03/29/2021 for a virtual visit (video or telephone).   Terri Simas, FNP 03/29/2021  2:08 PM    [redacted] weeks pregnant, acute back pain Lifting leg causes increased pain, waking up at night in pain  Now having new onset dizziness.   For the safety of you and your child, I recommend a face to face office visit with a health care provider.  Many mothers need to take medicines during their pregnancy and while nursing.  Almost all medicines pass into the breast milk in small quantities.  Most are generally considered safe for a mother to take but some medicines must be avoided.  After reviewing your  E-Visit request, I recommend that you consult your OB/GYN or pediatrician for medical advice in relation to your condition and prescription medications while pregnant or breastfeeding.  NOTE:  There will be NO CHARGE for this eVisit  If you are having a true medical emergency please call 911.    For an urgent face to face visit, [place] has six urgent care centers for your convenience:     [facility] at [facility] Directions [phone] [address] [place], [place] [address]    [facility] Health Urgent Care Center [facility]) Get Driving Directions [phone] [address] [place], [place] [address]  [facility]gent [facility] Center [facility]) Get Driving Directions [phone] [address] [place],  [place]  [address]  [facility] Health Urgent Care at [facility] Get Driving Directions [phone] [address] [place], [place] [address]   [facility] Health Urgent Care at [facility] Get Driving Directions  [phone] [address] [place], [place] [address]   [facility] Health Urgent Care at [facility] Directions [phone] [address] [place], [place] [address]  Your MyChart E-visit questionnaire answers were reviewed by a board certified advanced clinical practitioner to complete your personal care plan based on your specific symptoms.  Thank you for using e-Visits.

## 2021-03-31 NOTE — ED Provider Notes (Signed)
[facility] CARE [facility]ENTER   [id number] 03/29/21 Arrival Time: 1521  ASSESSMENT & PLAN:  1. Acute left-sided low back pain without sciatica   2. Cystitis   3. Vertigo    Tylenol for back pain. Able to ambulate here and hemodynamically stable. No indication for imaging of back at this time given no trauma and normal neurological exam. Discussed.  Begin: Meds ordered this encounter  Medications   cephALEXin (KEFLEX) 500 MG capsule    Sig: Take 1 capsule (500 mg total) by mouth 3 (three) times daily.    Dispense:  21 capsule    Refill:  0   Vertigo was self-limited yesterday; none today/currently. Comfortable with home observation. May f/u here as needed.   Reviewed expectations re: course of current medical issues. Questions answered. Outlined signs and symptoms indicating need for more acute intervention. Patient verbalized understanding. After Visit Summary given.   SUBJECTIVE: History from: patient.  Terri [name] is a 25 y.o. female who presents with complaint of intermittent L low back pain/buttock pain. Over past few days. No injury. Also with L breast tenderness around nipple; no erythema. No nipple drainage or bleeding. Afebrile. Currently pregnant in third trimester. No specific urinary symptoms. Normal PO intake without n/v/d.  Ambulatory without difficulty. Also reports one episode of vertigo yesterday; short-lived; not present today. No HA or visual changes.  OBJECTIVE:  Vitals:   03/29/21 1529  BP: 118/85  Pulse: 91  Resp: 18  Temp: 98.1 F (36.7 C)  TempSrc: Oral  SpO2: 98%    General appearance: alert; no distress HEENT: [place]; AT Neck: supple with FROM; without midline tenderness CV: regular Lungs: unlabored respirations; speaks full sentences without difficulty Breast: (chaperone RN present) mild tenderness around nipple without overlying erythema or warmth; no masses Abdomen: soft, gravid, non-tender; non-distended Back: no specific tenderness  to palpation; FROM at waist; bruising: none; without midline tenderness Extremities: without edema; symmetrical without gross deformities; normal ROM of bilateral LE Skin: warm and dry Neurologic: normal gait; normal sensation and strength of bilateral LE Psychological: alert and cooperative; normal mood and affect  Labs: Results for orders placed or performed during the hospital encounter of 03/29/21  POCT urinalysis dipstick  Result Value Ref Range   Color, UA yellow yellow   Clarity, UA clear clear   Glucose, UA negative negative mg/dL   Bilirubin, UA negative negative   Ketones, POC UA negative negative mg/dL   Spec Grav, UA 9.767 3.419 - 1.025   Blood, UA negative negative   pH, UA 7.0 5.0 - 8.0   Protein Ur, POC negative negative mg/dL   Urobilinogen, UA 0.2 0.2 or 1.0 E.U./dL   Nitrite, UA Positive (A) Negative   Leukocytes, UA Negative Negative   Labs Reviewed  POCT URINALYSIS DIP (MANUAL ENTRY) - Abnormal; Notable for the following components:      Result Value   Nitrite, UA Positive (*)    All other components within normal limits  URINE CULTURE    Allergies  Allergen Reactions   Sulfa Antibiotics Other (See Comments)    Reaction: unknown.  Told allergic as child but believes she's taken since.   Zithromax [Azithromycin] Other (See Comments)    Reaction: unknown Told reaction as a child    Past Medical History:  Diagnosis Date   Anxiety    No pertinent past medical history    Social History   Socioeconomic History   Marital status: Single    Spouse name: Not on file  Number of children: 2   Years of education: Not on file   Highest education level: Not on file  Occupational History   Occupation: SAHM  Tobacco Use   Smoking status: Every Day    Packs/day: 0.50    Years: 8.00    Pack years: 4.00    Types: Cigarettes   Smokeless tobacco: Never  Vaping Use   Vaping Use: Never used  Substance and Sexual Activity   Alcohol use: No   Drug use: No    Sexual activity: Yes    Partners: Male    Birth control/protection: None  Other Topics Concern   Not on file  Social History Narrative   Not on file   Social Determinants of Health   Financial Resource Strain: Not on file  Food Insecurity: Not on file  Transportation Needs: Not on file  Physical Activity: Not on file  Stress: Not on file  Social Connections: Not on file  Intimate Partner Violence: Not on file   Family History  Problem Relation Age of Onset   Arthritis Mother    Thyroid disease Mother    Depression Mother    Anxiety disorder Mother    Anxiety disorder Maternal Grandmother    Depression Maternal Grandmother    COPD Maternal Grandmother    ALS Maternal Grandfather    Breast cancer Neg Hx    Colon cancer Neg Hx    Past Surgical History:  Procedure Laterality Date   CHOLECYSTECTOMY N/A 11/04/2018   Procedure: LAPAROSCOPIC CHOLECYSTECTOMY;  Surgeon: Henrene Dodge, MD;  Location: [facility] ORS;  Service: General;  Laterality: N/A;   DILATION AND EVACUATION N/A 07/13/2017   Procedure: DILATATION AND EVACUATION;  Surgeon: Ward, Elenora Fender, MD;  Location: [facility] ORS;  Service: Gynecology;  Laterality: N/A;   ERCP N/A 11/14/2018   Procedure: ENDOSCOPIC RETROGRADE CHOLANGIOPANCREATOGRAPHY (ERCP);  Surgeon: Midge Minium, MD;  Location: [facility] ENDOSCOPY;  Service: Endoscopy;  Laterality: N/AMardella Layman, MD 03/31/21 [phone]

## 2021-04-01 LAB — URINE CULTURE: Culture: >=100000 — AB

## 2021-04-14 ENCOUNTER — Emergency Department: Admission: EM | Admit: 2021-04-14 | Payer: Medicaid Other | Source: Home / Self Care

## 2021-04-15 DIAGNOSIS — Z20822 Contact with and (suspected) exposure to covid-19: Secondary | ICD-10-CM | POA: Diagnosis not present

## 2021-05-30 ENCOUNTER — Other Ambulatory Visit: Payer: Self-pay

## 2021-05-30 ENCOUNTER — Telehealth: Payer: Self-pay | Admitting: Obstetrics and Gynecology

## 2021-05-30 ENCOUNTER — Encounter: Payer: Self-pay | Admitting: Emergency Medicine

## 2021-05-30 ENCOUNTER — Emergency Department
Admission: EM | Admit: 2021-05-30 | Discharge: 2021-05-30 | Disposition: A | Discharge status: Home / Self Care | Payer: Medicaid Other | Attending: Emergency Medicine | Admitting: Emergency Medicine

## 2021-05-30 ENCOUNTER — Emergency Department: Payer: Medicaid Other

## 2021-05-30 DIAGNOSIS — R062 Wheezing: Secondary | ICD-10-CM | POA: Diagnosis not present

## 2021-05-30 DIAGNOSIS — O98513 Other viral diseases complicating pregnancy, third trimester: Secondary | ICD-10-CM | POA: Diagnosis not present

## 2021-05-30 DIAGNOSIS — U071 COVID-19: Secondary | ICD-10-CM | POA: Diagnosis not present

## 2021-05-30 DIAGNOSIS — O99891 Other specified diseases and conditions complicating pregnancy: Secondary | ICD-10-CM | POA: Insufficient documentation

## 2021-05-30 DIAGNOSIS — R509 Fever, unspecified: Secondary | ICD-10-CM | POA: Diagnosis not present

## 2021-05-30 DIAGNOSIS — R079 Chest pain, unspecified: Secondary | ICD-10-CM | POA: Insufficient documentation

## 2021-05-30 DIAGNOSIS — R0902 Hypoxemia: Secondary | ICD-10-CM | POA: Diagnosis not present

## 2021-05-30 DIAGNOSIS — R059 Cough, unspecified: Secondary | ICD-10-CM | POA: Diagnosis not present

## 2021-05-30 DIAGNOSIS — Z3A36 36 weeks gestation of pregnancy: Secondary | ICD-10-CM | POA: Insufficient documentation

## 2021-05-30 DIAGNOSIS — F17210 Nicotine dependence, cigarettes, uncomplicated: Secondary | ICD-10-CM | POA: Insufficient documentation

## 2021-05-30 DIAGNOSIS — R000 Tachycardia, unspecified: Secondary | ICD-10-CM | POA: Diagnosis not present

## 2021-05-30 DIAGNOSIS — Z8616 Personal history of COVID-19: Secondary | ICD-10-CM | POA: Insufficient documentation

## 2021-05-30 DIAGNOSIS — R0602 Shortness of breath: Secondary | ICD-10-CM | POA: Insufficient documentation

## 2021-05-30 LAB — COMPREHENSIVE METABOLIC PANEL
ALT: 11 U/L (ref 0–44)
AST: 14 U/L — ABNORMAL LOW (ref 15–41)
Albumin: 2.7 g/dL — ABNORMAL LOW (ref 3.5–5.0)
Alkaline Phosphatase: 154 U/L — ABNORMAL HIGH (ref 38–126)
Anion gap: 8 (ref 5–15)
BUN: 6 mg/dL (ref 6–20)
CO2: 22 mmol/L (ref 22–32)
Calcium: 8.1 mg/dL — ABNORMAL LOW (ref 8.9–10.3)
Chloride: 105 mmol/L (ref 98–111)
Creatinine, Ser: 0.47 mg/dL (ref 0.44–1.00)
GFR, Estimated: >60 mL/min (ref >60)
Glucose, Bld: 103 mg/dL — ABNORMAL HIGH (ref 70–99)
Potassium: 4 mmol/L (ref 3.5–5.1)
Sodium: 135 mmol/L (ref 135–145)
Total Bilirubin: 0.5 mg/dL (ref 0.3–1.2)
Total Protein: 6.9 g/dL (ref 6.5–8.1)

## 2021-05-30 LAB — CBC WITH DIFFERENTIAL/PLATELET
Abs Immature Granulocytes: 0.12 10*3/uL — ABNORMAL HIGH (ref 0.00–0.07)
Basophils Absolute: 0 10*3/uL (ref 0.0–0.1)
Basophils Relative: 0 %
Eosinophils Absolute: 0.4 10*3/uL (ref 0.0–0.5)
Eosinophils Relative: 3 %
HCT: 34.8 % — ABNORMAL LOW (ref 36.0–46.0)
Hemoglobin: 12.4 g/dL (ref 12.0–15.0)
Immature Granulocytes: 1 %
Lymphocytes Relative: 9 %
Lymphs Abs: 1.4 10*3/uL (ref 0.7–4.0)
MCH: 29.2 pg (ref 26.0–34.0)
MCHC: 35.6 g/dL (ref 30.0–36.0)
MCV: 82.1 fL (ref 80.0–100.0)
Monocytes Absolute: 1 10*3/uL (ref 0.1–1.0)
Monocytes Relative: 6 %
Neutro Abs: 13 10*3/uL — ABNORMAL HIGH (ref 1.7–7.7)
Neutrophils Relative %: 81 %
Platelets: 191 10*3/uL (ref 150–400)
RBC: 4.24 MIL/uL (ref 3.87–5.11)
RDW: 13.4 % (ref 11.5–15.5)
WBC: 15.8 10*3/uL — ABNORMAL HIGH (ref 4.0–10.5)
nRBC: 0 % (ref 0.0–0.2)

## 2021-05-30 LAB — TROPONIN I (HIGH SENSITIVITY): Troponin I (High Sensitivity): 4 ng/L (ref <18)

## 2021-05-30 MED ORDER — IPRATROPIUM-ALBUTEROL 0.5-2.5 (3) MG/3ML IN SOLN
3.0000 mL | Freq: Once | RESPIRATORY_TRACT | Status: AC
  Administered 2021-05-30: 3 mL via RESPIRATORY_TRACT
  Filled 2021-05-30: qty 3

## 2021-05-30 MED ORDER — PREDNISONE 20 MG PO TABS: 40.0000 mg | ORAL_TABLET | Freq: Every day | ORAL | 0 refills | Status: AC

## 2021-05-30 MED ORDER — HYDROCODONE BIT-HOMATROP MBR 5-1.5 MG/5ML PO SOLN
5.0000 mL | Freq: Once | ORAL | Status: AC
  Administered 2021-05-30: 5 mL via ORAL
  Filled 2021-05-30: qty 5

## 2021-05-30 MED ORDER — METHYLPREDNISOLONE SODIUM SUCC 125 MG IJ SOLR
125.0000 mg | Freq: Once | INTRAMUSCULAR | Status: AC
  Administered 2021-05-30: 125 mg via INTRAVENOUS
  Filled 2021-05-30: qty 2

## 2021-05-30 MED ORDER — HYDROCOD POLST-CPM POLST ER 10-8 MG/5ML PO SUER: 5.0000 mL | Freq: Two times a day (BID) | ORAL | 0 refills | Status: DC | PRN

## 2021-05-30 MED ORDER — ACETAMINOPHEN 500 MG PO TABS
1000.0000 mg | ORAL_TABLET | Freq: Once | ORAL | Status: AC
  Administered 2021-05-30: 1000 mg via ORAL
  Filled 2021-05-30: qty 2

## 2021-05-30 MED ORDER — SODIUM CHLORIDE 0.9 % IV BOLUS
1000.0000 mL | Freq: Once | INTRAVENOUS | Status: AC
  Administered 2021-05-30: 1000 mL via INTRAVENOUS

## 2021-05-30 MED ORDER — IPRATROPIUM-ALBUTEROL 0.5-2.5 (3) MG/3ML IN SOLN
RESPIRATORY_TRACT | Status: AC
  Administered 2021-05-30: 3 mL via RESPIRATORY_TRACT
  Filled 2021-05-30: qty 3

## 2021-05-30 MED ORDER — IOHEXOL 350 MG/ML SOLN
75.0000 mL | Freq: Once | INTRAVENOUS | Status: AC | PRN
  Administered 2021-05-30: 75 mL via INTRAVENOUS

## 2021-05-30 MED ORDER — ALBUTEROL SULFATE HFA 108 (90 BASE) MCG/ACT IN AERS
2.0000 | INHALATION_SPRAY | Freq: Once | RESPIRATORY_TRACT | Status: AC
  Administered 2021-05-30: 2 via RESPIRATORY_TRACT
  Filled 2021-05-30: qty 6.7

## 2021-05-30 MED ORDER — IPRATROPIUM-ALBUTEROL 0.5-2.5 (3) MG/3ML IN SOLN: 3.0000 mL | Freq: Once | RESPIRATORY_TRACT | Status: AC

## 2021-05-30 NOTE — ED Triage Notes (Signed)
Pt to ED via [facility] with c/o cough, per EMS pt dx with covid 1 month ago. Per EMS pt developed cough 1 week after ending "covid" cycle and has since had a cough x 1 month. Pt also [redacted] weeks pregnant, per EMS pt ambulatory on scene PTA.   120/89 96% RA 96HR

## 2021-05-30 NOTE — Discharge Instructions (Addendum)
Take the prednisone in the mornings as prescribed  Drink plenty of fluids  Take the cough medicine as needed for cough  Use the albuterol inhaler every 4-6 hours for the next 24 hours, then as needed for wheezing

## 2021-05-30 NOTE — ED Provider Notes (Signed)
Patient care assumed from Dr. Dolores Frame.  Briefly, 24 year old G3, P2 at [redacted] weeks pregnant here with cough after recent COVID infection.  CT angio pending.  CT angio obtained, reviewed.  Unfortunately, there is a somewhat suboptimal timing of the bolus.  That being said, she has chest pain only with coughing, and preceding COVID infection with no evidence of central or main PE on CT, low suspicion for this clinically.  She is able to ambulate without hypoxia.  She has some mild wheezing on exam I suspect she has postviral wheezing and secondary pulmonary inflammation in the setting of COVID-19.  Discussed case with Dr. Valentino Saxon of OB, who feels it is reasonable to treat with antitussives short-term, in addition to prednisone which will start here.  She will also be provided with an albuterol inhaler.  Given absence of any lower extremity swelling or signs of DVT or other high risk features, feel this is reasonable at this time.  Return precautions given.   Shaune Pollack, MD 05/30/21 1020

## 2021-05-30 NOTE — ED Notes (Signed)
Patient ambulatory in room with pulse oximetry. Patient maintained O2 sats of 94-96% on room air. MD Isaacs aware.

## 2021-05-30 NOTE — ED Provider Notes (Signed)
[facility] Emergency Department Provider Note   ____________________________________________   Event Date/Time   First MD Initiated Contact with Patient 05/30/21 0601     (approximate)  I have reviewed the triage vital signs and the nursing notes.   HISTORY  Chief Complaint Cough    HPI Terri [name] is a 25 y.o. female to the ED via EMS from home with a chief complaint of cough.  Patient is G5, P3 approximately 3 [redacted] weeks pregnant, followed by [facility].  Reports COVID-19 infection 1 month ago.  She was unvaccinated at that time and was not treated with Paxlovid or monoclonal antibodies.  At the time of her COVID infection, patient reports her symptoms were fever and body aches.  She developed a dry cough approximately 1 week after her diagnosis.  Reports uncontrollable dry cough associated with chest pain and shortness of breath with wheezing.  Exacerbated by deep inspiration.  Denies current fevers, abdominal pain, vaginal bleeding, nausea, vomiting or dizziness.     Past Medical History:  Diagnosis Date   Anxiety    No pertinent past medical history     Patient Active Problem List   Diagnosis Date Noted   Left hand pain 10/30/2020   Arthralgia of both knees and bilateral hands.  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    Past Surgical History:  Procedure Laterality Date   CHOLECYSTECTOMY N/A 11/04/2018   Procedure: LAPAROSCOPIC CHOLECYSTECTOMY;  Surgeon: Henrene Dodge, MD;  Location: [facility] ORS;  Service: General;  Laterality: N/A;   DILATION AND EVACUATION N/A 07/13/2017    Procedure: DILATATION AND EVACUATION;  Surgeon: Ward, Elenora Fender, MD;  Location: [facility] ORS;  Service: Gynecology;  Laterality: N/A;   ERCP N/A 11/14/2018   Procedure: ENDOSCOPIC RETROGRADE CHOLANGIOPANCREATOGRAPHY (ERCP);  Surgeon: Midge Minium, MD;  Location: [facility] ENDOSCOPY;  Service: Endoscopy;  Laterality: N/A;    Prior to Admission medications   Medication Sig Start Date End Date Taking? Authorizing Provider  cephALEXin (KEFLEX) 500 MG capsule Take 1 capsule (500 mg total) by mouth 3 (three) times daily. 03/29/21   Mardella Layman, MD  Prenatal Vit-Fe Fumarate-FA (MULTIVITAMIN-PRENATAL) 27-0.8 MG TABS tablet Take 1 tablet by mouth daily at 12 noon.    [provider]    Allergies Sulfa antibiotics and Zithromax [azithromycin]  Family History  Problem Relation Age of Onset   Arthritis Mother    Thyroid disease Mother    Depression Mother    Anxiety disorder Mother    Anxiety disorder Maternal Grandmother    Depression Maternal Grandmother    COPD Maternal Grandmother    ALS Maternal Grandfather    Breast cancer Neg Hx    Colon cancer Neg Hx     Social History Social History   Tobacco Use   Smoking status: Every Day    Packs/day: 0.50    Years: 8.00    Pack years: 4.00    Types: Cigarettes   Smokeless tobacco: Never  Vaping Use   Vaping Use: Never used  Substance Use Topics   Alcohol use: No   Drug use: No    Review of Systems  Constitutional: No fever/chills Eyes: No visual  changes. ENT: No sore throat. Cardiovascular: Positive for chest pain. Respiratory: Positive for cough and shortness of breath. Gastrointestinal: No abdominal pain.  No nausea, no vomiting.  No diarrhea.  No constipation. Genitourinary: Negative for dysuria. Musculoskeletal: Negative for back pain. Skin: Negative for rash. Neurological: Negative for headaches, focal weakness or numbness.   ____________________________________________   PHYSICAL EXAM:  VITAL SIGNS: ED Triage  Vitals  Enc Vitals Group     BP 05/30/21 0424 125/88     Pulse Rate 05/30/21 0424 (!) 101     Resp 05/30/21 0424 20     Temp 05/30/21 0424 98.5 F (36.9 C)     Temp Source 05/30/21 0424 Oral     SpO2 05/30/21 0424 96 %     Weight --      Height --      Head Circumference --      Peak Flow --      Pain Score 05/30/21 0433 4     Pain Loc --      Pain Edu? --      Excl. in GC? --     Constitutional: Alert and oriented. Well appearing and in mild acute distress. Eyes: Conjunctivae are normal. PERRL. EOMI. Head: Atraumatic. Nose: No congestion/rhinnorhea. Mouth/Throat: Mucous membranes are moist.   Neck: No stridor.   Cardiovascular: Normal rate, regular rhythm. Grossly normal heart sounds.  Good peripheral circulation. Respiratory: Normal respiratory effort.  No retractions. Lungs with scattered wheezing.  Active dry cough noted. Gastrointestinal: Gravid.  Soft and nontender. No distention. No abdominal bruits. No CVA tenderness. Musculoskeletal: No lower extremity tenderness nor edema.  No joint effusions. Neurologic:  Normal speech and language. No gross focal neurologic deficits are appreciated. No gait instability. Skin:  Skin is warm, dry and intact. No rash noted. Psychiatric: Mood and affect are normal. Speech and behavior are normal.  ____________________________________________   LABS (all labs ordered are listed, but only abnormal results are displayed)  Labs Reviewed  CBC WITH DIFFERENTIAL/PLATELET  COMPREHENSIVE METABOLIC PANEL  TROPONIN I (HIGH SENSITIVITY)   ____________________________________________  EKG  Pending ____________________________________________  RADIOLOGY Tawni Millers J, personally viewed and evaluated these images (plain radiographs) as part of my medical decision making, as well as reviewing the written report by the radiologist.  ED MD interpretation: No acute cardiopulmonary process; CTA pending  Official radiology report(s): DG  Chest 2 View  Result Date: 05/30/2021 CLINICAL DATA:  Cough EXAM: CHEST - 2 VIEW COMPARISON:  2020 FINDINGS: The heart size and mediastinal contours are within normal limits. Low lung volumes. No consolidation or edema. The visualized skeletal structures are unremarkable. Cholecystectomy clips. IMPRESSION: No acute process in the chest. Electronically Signed   By: Guadlupe Spanish M.D.   On: 05/30/2021 05:27    ____________________________________________   PROCEDURES  Procedure(s) performed (including Critical Care):  .1-3 Lead EKG Interpretation Performed by: Irean Hong, MD Authorized by: Irean Hong, MD     Interpretation: abnormal     ECG rate:  100   ECG rate assessment: tachycardic     Rhythm: sinus tachycardia     Ectopy: none     Conduction: normal   Comments:     Patient placed on cardiac monitor to evaluate for arrhythmias   ____________________________________________   INITIAL IMPRESSION / ASSESSMENT AND PLAN / ED COURSE  As part of my medical decision making, I reviewed the following data within the electronic MEDICAL RECORD NUMBER Nursing notes reviewed and incorporated, Labs reviewed, EKG interpreted, Old  chart reviewed, Radiograph reviewed, and Notes from prior ED visits     25 year old female approximately [redacted] weeks pregnant presenting with cough, chest pain, wheezing and shortness of breath in the setting of recent COVID-19 infection. Differential includes, but is not limited to, viral syndrome, bronchitis including COPD exacerbation, pneumonia, reactive airway disease including asthma, CHF including exacerbation with or without pulmonary/interstitial edema, pneumothorax, ACS, thoracic trauma, and pulmonary embolism.   Will obtain lab work, ambulation trial.  Administer DuoNeb, IV Solu-Medrol.  Discussed with patient risk/benefits of obtaining CTA chest to evaluate for PE; patient consents.  Clinical Course as of 05/30/21 5732  Fri May 30, 2021  2025 Care will be  transferred to the oncoming provider pending lab work, EKG, CT scan, medications and reassessment. [JS]    Clinical Course User Index [JS] Irean Hong, MD     ____________________________________________   FINAL CLINICAL IMPRESSION(S) / ED DIAGNOSES  Final diagnoses:  Cough     ED Discharge Orders     None        Note:  This document was prepared using Dragon voice recognition software and may include unintentional dictation errors.    Irean Hong, MD 05/30/21 [phone]

## 2021-06-02 ENCOUNTER — Telehealth: Payer: Self-pay

## 2021-06-02 ENCOUNTER — Ambulatory Visit: Payer: Self-pay | Admitting: *Deleted

## 2021-06-02 NOTE — Telephone Encounter (Signed)
Transition Care Management Unsuccessful Follow-up Telephone Call  Date of discharge and from where:  05/30/2021-[facility]  Attempts:  1st Attempt  Reason for unsuccessful TCM follow-up call:  Left voice message    

## 2021-06-02 NOTE — Telephone Encounter (Signed)
error 

## 2021-06-02 NOTE — Telephone Encounter (Addendum)
Pt. Reports she had COVID 19 in August. Developed a cough and is still coughing. Seen in ED 05/30/21. Treated with Prednisone and inhaler per pt. Pt. States she is still coughing continuously. Coughing during conversation.Feels like she is no better. No availability in the practice. OB/GYN cannot see pt. Until next week. Pt. Instructed to return to ED. Verbalizes understanding.    Reason for Disposition  Patient sounds very sick or weak to the triager  Answer Assessment - Initial Assessment Questions 1. ONSET: "When did the cough begin?"      August 2. SEVERITY: "How bad is the cough today?"      Severe 3. SPUTUM: "Describe the color of your sputum" (none, dry cough; clear, white, yellow, green)     No 4. HEMOPTYSIS: "Are you coughing up any blood?" If so ask: "How much?" (flecks, streaks, tablespoons, etc.)     No 5. DIFFICULTY BREATHING: "Are you having difficulty breathing?" If Yes, ask: "How bad is it?" (e.g., mild, moderate, severe)    - MILD: No SOB at rest, mild SOB with walking, speaks normally in sentences, can lie down, no retractions, pulse < 100.    - MODERATE: SOB at rest, SOB with minimal exertion and prefers to sit, cannot lie down flat, speaks in phrases, mild retractions, audible wheezing, pulse 100-120.    - SEVERE: Very SOB at rest, speaks in single words, struggling to breathe, sitting hunched forward, retractions, pulse > 120      Mild 6. FEVER: "Do you have a fever?" If Yes, ask: "What is your temperature, how was it measured, and when did it start?"     No 7. CARDIAC HISTORY: "Do you have any history of heart disease?" (e.g., heart attack, congestive heart failure)      No 8. LUNG HISTORY: "Do you have any history of lung disease?"  (e.g., pulmonary embolus, asthma, emphysema)     No 9. PE RISK FACTORS: "Do you have a history of blood clots?" (or: recent major surgery, recent prolonged travel, bedridden)     No 10. OTHER SYMPTOMS: "Do you have any other symptoms?"  (e.g., runny nose, wheezing, chest pain)       Wheezing 11. PREGNANCY: "Is there any chance you are pregnant?" "When was your last menstrual period?"       [redacted] weeks pregnant 12. TRAVEL: "Have you traveled out of the country in the last month?" (e.g., travel history, exposures)       No  Protocols used: Cough - Acute Non-Productive-A-AH

## 2021-06-03 NOTE — Telephone Encounter (Signed)
Transition Care Management Unsuccessful Follow-up Telephone Call  Date of discharge and from where:  [facility]   Attempts:  2nd Attempt  Reason for unsuccessful TCM follow-up call:  Left voice message      

## 2021-06-04 NOTE — Telephone Encounter (Signed)
Transition Care Management Unsuccessful Follow-up Telephone Call  Date of discharge and from where:  05/30/2021 from [facility]  Attempts:  3rd Attempt  Reason for unsuccessful TCM follow-up call:  Unable to reach patient    

## 2021-06-10 ENCOUNTER — Ambulatory Visit (INDEPENDENT_AMBULATORY_CARE_PROVIDER_SITE_OTHER): Payer: Medicaid Other | Admitting: Obstetrics and Gynecology

## 2021-06-10 ENCOUNTER — Other Ambulatory Visit: Payer: Self-pay

## 2021-06-10 ENCOUNTER — Other Ambulatory Visit: Payer: Self-pay | Admitting: Obstetrics and Gynecology

## 2021-06-10 ENCOUNTER — Encounter: Payer: Self-pay | Admitting: Obstetrics and Gynecology

## 2021-06-10 VITALS — BP 123/92 | HR 89 | Temp 98.4°F | Wt 239.4 lb

## 2021-06-10 DIAGNOSIS — Z3483 Encounter for supervision of other normal pregnancy, third trimester: Secondary | ICD-10-CM | POA: Diagnosis not present

## 2021-06-10 DIAGNOSIS — Z131 Encounter for screening for diabetes mellitus: Secondary | ICD-10-CM

## 2021-06-10 DIAGNOSIS — Z0283 Encounter for blood-alcohol and blood-drug test: Secondary | ICD-10-CM | POA: Diagnosis not present

## 2021-06-10 DIAGNOSIS — Z23 Encounter for immunization: Secondary | ICD-10-CM

## 2021-06-10 DIAGNOSIS — Z3A37 37 weeks gestation of pregnancy: Secondary | ICD-10-CM | POA: Diagnosis not present

## 2021-06-10 DIAGNOSIS — Z113 Encounter for screening for infections with a predominantly sexual mode of transmission: Secondary | ICD-10-CM

## 2021-06-10 LAB — POCT URINALYSIS DIPSTICK OB
Bilirubin, UA: NEGATIVE
Blood, UA: NEGATIVE
Glucose, UA: NEGATIVE
Ketones, UA: NEGATIVE
Nitrite, UA: NEGATIVE
POC,PROTEIN,UA: NEGATIVE
Spec Grav, UA: 1.02 (ref 1.010–1.025)
Urobilinogen, UA: 0.2 E.U./dL
pH, UA: 6.5 (ref 5.0–8.0)

## 2021-06-10 NOTE — Progress Notes (Signed)
ROB: She feels some pressure and is a little tired, otherwise doing well. No new concerns

## 2021-06-10 NOTE — Progress Notes (Signed)
ROB: Patient not seen since 17 weeks of gestation at her new OB visit.  She says that she took a new job, got busy and lost track of time.  She says she is now on maternity leave and plans to come for her prenatal care.  CBC, Tdap, RPR, GBS, GC/CT, A1c and UDS performed today.  Patient reports daily fetal movement.  Denies contractions.

## 2021-06-11 LAB — PAIN MGT SCRN (14 DRUGS), UR
Amphetamine Scrn, Ur: NEGATIVE ng/mL
BARBITURATE SCREEN URINE: NEGATIVE ng/mL
BENZODIAZEPINE SCREEN, URINE: NEGATIVE ng/mL
Buprenorphine, Urine: NEGATIVE ng/mL
CANNABINOIDS UR QL SCN: POSITIVE ng/mL — AB
Cocaine (Metab) Scrn, Ur: NEGATIVE ng/mL
Creatinine(Crt), U: 247.6 mg/dL (ref 20.0–300.0)
Fentanyl, Urine: NEGATIVE pg/mL
Meperidine Screen, Urine: NEGATIVE ng/mL
Methadone Screen, Urine: NEGATIVE ng/mL
OXYCODONE+OXYMORPHONE UR QL SCN: NEGATIVE ng/mL
Opiate Scrn, Ur: NEGATIVE ng/mL
Ph of Urine: 6.1 (ref 4.5–8.9)
Phencyclidine Qn, Ur: NEGATIVE ng/mL
Propoxyphene Scrn, Ur: NEGATIVE ng/mL
Tramadol Screen, Urine: NEGATIVE ng/mL

## 2021-06-11 LAB — HEMOGLOBIN A1C
Est. average glucose Bld gHb Est-mCnc: 105 mg/dL
Hgb A1c MFr Bld: 5.3 % (ref 4.8–5.6)

## 2021-06-11 LAB — CBC
Hematocrit: 35.7 % (ref 34.0–46.6)
Hemoglobin: 12 g/dL (ref 11.1–15.9)
MCH: 27.8 pg (ref 26.6–33.0)
MCHC: 33.6 g/dL (ref 31.5–35.7)
MCV: 83 fL (ref 79–97)
Platelets: 245 10*3/uL (ref 150–450)
RBC: 4.32 x10E6/uL (ref 3.77–5.28)
RDW: 13.3 % (ref 11.7–15.4)
WBC: 12.9 10*3/uL — ABNORMAL HIGH (ref 3.4–10.8)

## 2021-06-11 LAB — RPR: RPR Ser Ql: NONREACTIVE

## 2021-06-12 ENCOUNTER — Telehealth: Payer: Self-pay | Admitting: Obstetrics and Gynecology

## 2021-06-12 LAB — STREP GP B NAA: Strep Gp B NAA: NEGATIVE

## 2021-06-12 NOTE — Telephone Encounter (Signed)
Called on 10-11 @ 1113- spoke with pt making her aware that we received her FMLA paperwork via fax, made her aware of $25.00 processing fee. Pt stated that she did not know if she needed fmla filled out she thought she had already been denied she would double check and let us know.Terri [name] on 10-13 @ 1158 spoke with pt who states that she doe not need FMLA paperwork filled out by office, and approved for [place] to shred in hippa box.

## 2021-06-14 LAB — GC/CHLAMYDIA PROBE AMP
Chlamydia trachomatis, NAA: NEGATIVE
Neisseria Gonorrhoeae by PCR: NEGATIVE

## 2021-06-16 ENCOUNTER — Telehealth: Payer: Self-pay

## 2021-06-16 NOTE — Telephone Encounter (Signed)
Lmtcb. Patient does have an appt with OBGYN 06/17/21. Per documentation patient does not need FMLA paperwork filled out.

## 2021-06-16 NOTE — Telephone Encounter (Signed)
Please let patient know if these are related to her pregnancy, they need to be sent to her OB/gyn.

## 2021-06-16 NOTE — Telephone Encounter (Signed)
Received FMLA forms. Not sure if they were sent to Dr. B by mistake or if they were forwarded from Dr. Valentino Saxon. Forms placed in Dr. Senaida Lange box. Please advise. Thanks TNP

## 2021-06-17 ENCOUNTER — Other Ambulatory Visit: Payer: Self-pay

## 2021-06-17 ENCOUNTER — Ambulatory Visit (INDEPENDENT_AMBULATORY_CARE_PROVIDER_SITE_OTHER): Payer: Medicaid Other | Admitting: Obstetrics and Gynecology

## 2021-06-17 ENCOUNTER — Encounter: Payer: Self-pay | Admitting: Obstetrics and Gynecology

## 2021-06-17 VITALS — BP 123/84 | HR 100 | Wt 238.8 lb

## 2021-06-17 DIAGNOSIS — O99210 Obesity complicating pregnancy, unspecified trimester: Secondary | ICD-10-CM

## 2021-06-17 DIAGNOSIS — O0993 Supervision of high risk pregnancy, unspecified, third trimester: Secondary | ICD-10-CM

## 2021-06-17 DIAGNOSIS — Z3483 Encounter for supervision of other normal pregnancy, third trimester: Secondary | ICD-10-CM

## 2021-06-17 DIAGNOSIS — Z3A38 38 weeks gestation of pregnancy: Secondary | ICD-10-CM

## 2021-06-17 DIAGNOSIS — O0933 Supervision of pregnancy with insufficient antenatal care, third trimester: Secondary | ICD-10-CM | POA: Diagnosis not present

## 2021-06-17 DIAGNOSIS — F17200 Nicotine dependence, unspecified, uncomplicated: Secondary | ICD-10-CM | POA: Diagnosis not present

## 2021-06-17 LAB — POCT URINALYSIS DIPSTICK OB
Bilirubin, UA: NEGATIVE
Blood, UA: NEGATIVE
Glucose, UA: NEGATIVE
Ketones, UA: NEGATIVE
Nitrite, UA: NEGATIVE
Spec Grav, UA: 1.02 (ref 1.010–1.025)
Urobilinogen, UA: 0.2 E.U./dL
pH, UA: 6.5 (ref 5.0–8.0)

## 2021-06-17 NOTE — Patient Instructions (Addendum)
COVID 19 Instructions for Scheduled Procedure (Inductions/C-sections and GYN surgeries)   Thank you for choosing [facility] for your services.  You have been scheduled for a procedure called _________Induction of Labor___________________.    Your procedure is scheduled on _________Tuesday, June 24, 2021 at midnight______________.  You are required to have COVID-19 testing performed 2 days prior to your scheduled procedure date.  Testing is performed between 8 AM and 12 PM Monday through Friday.  Please present for testing on __Monday, October 24, 2022_________ during this hour. Testing is performed in the [facility] (this is next to the [employer]).    Upon your scheduled procedure date, you will need to arrive at the [facility] entrance. (There is a statue at the front of this entrance.)   Please arrive on time if you are scheduled for an induction of labor.   If you are scheduled for a Cesarean delivery or for Gyn Surgery, arrive 2 hours prior to your procedure time.   If you are an Obstetric patient and your arrival time falls between 11 PM and 6 AM call L&D [phone]) when you arrive.  A staff member will meet you at the [facility] entrance.  At this time, patients are allowed 1 support person to accompany them. Face masks are required for you and your support person. Your support person is now allowed to be there with you during the entire time of your admission.   Please contact the office if you have any questions regarding this information.  The [facility] office number is [phone].     Thank you,    Your [facility] Providers     Labor Induction Labor induction is when steps are taken to cause a pregnant woman to begin the labor process. Most women go into labor on their own between 37 weeks and 42 weeks of pregnancy. When this does not happen, or when there is a medical need for labor to begin, steps may be taken to induce, or bring  on, labor. Labor induction causes a pregnant woman's uterus to contract. It also causes the cervix to soften (ripen), open (dilate), and thin out. Usually, labor is not induced before 39 weeks of pregnancy unless there is a medical reason to do so. When is labor induction considered? Labor induction may be right for you if: Your pregnancy lasts longer than 41 to 42 weeks. Your placenta is separating from your uterus (placental abruption). You have a rupture of membranes and your labor does not begin. You have health problems, like diabetes or high blood pressure (preeclampsia) during your pregnancy. Your baby has stopped growing or does not have enough amniotic fluid. Before labor induction begins, your health care provider will consider the following factors: Your medical condition and the baby's condition. How many weeks you have been pregnant. How mature the baby's lungs are. The condition of your cervix. The position of the baby. The size of your birth canal. Tell a health care provider about: Any allergies you have. All medicines you are taking, including vitamins, herbs, eye drops, creams, and over-the-counter medicines. Any problems you or your family members have had with anesthetic medicines. Any surgeries you have had. Any blood disorders you have. Any medical conditions you have. What are the risks? Generally, this is a safe procedure. However, problems may occur, including: Failed induction. Changes in fetal heart rate, such as being too high, too low, or irregular (erratic). Infection in the mother or the  baby. Increased risk of having a cesarean delivery. Breaking off (abruption) of the placenta from the uterus. This is rare. Rupture of the uterus. This is very rare. Your baby could fail to get enough blood flow or oxygen. This can be life-threatening. When induction is needed for medical reasons, the benefits generally outweigh the risks. What happens during the  procedure? During the procedure, your health care provider will use one of these methods to induce labor: Stripping the membranes. In this method, the amniotic sac tissue is gently separated from the cervix. This causes the following to happen: Your cervix stretches, which in turn causes the release of prostaglandins. Prostaglandins induce labor and cause the uterus to contract. This procedure is often done in an office visit. You will be sent home to wait for contractions to begin. Prostaglandin medicine. This medicine starts contractions and causes the cervix to dilate and ripen. This can be taken by mouth (orally) or by being inserted into the vagina (suppository). Inserting a small, thin tube (catheter) with a balloon into the vagina and then expanding the balloon with water to dilate the cervix. Breaking the water. In this method, a small instrument is used to make a small hole in the amniotic sac. This eventually causes the amniotic sac to break. Contractions should begin within a few hours. Medicine to trigger or strengthen contractions. This medicine is given through an IV that is inserted into a vein in your arm. This procedure may vary among health care providers and hospitals. Where to find more information March of [employer]: www.marchofdimes.org [employer] of [employer]st[employer]ians [employer]necologists: www.acog.org Summary Labor induction causes a pregnant woman's uterus to contract. It also causes the cervix to soften (ripen), open (dilate), and thin out. Labor is usually not induced before 39 weeks of pregnancy unless there is a medical reason to do so. When induction is needed for medical reasons, the benefits generally outweigh the risks. Talk with your health care provider about which methods of labor induction are right for you. This information is not intended to replace advice given to you by your health care provider. Make sure you discuss any questions you have with your health  care provider. Document Revised: 05/30/2020 Document Reviewed: 05/30/2020 Elsevier Patient Education  2022 [employer].

## 2021-06-17 NOTE — Progress Notes (Signed)
ROB: She is doing well, no new concerns today. ?

## 2021-06-17 NOTE — Progress Notes (Signed)
ROB: Patient without complaints. Discussed need for IOL due to BMI > 40.  Patient ok to schedule. Scheduled for 06/25/2021 at midnight.  Discussed need for COVID testing, and visitor policy. Needs NST today however due to lateness of the visit will need to reschedule. Declines cervical check today. Advised on fetal kick counts.

## 2021-06-23 ENCOUNTER — Other Ambulatory Visit
Admission: RE | Admit: 2021-06-23 | Discharge: 2021-06-23 | Disposition: A | Discharge status: Home / Self Care | Payer: Medicaid Other | Source: Ambulatory Visit | Attending: Obstetrics and Gynecology | Admitting: Obstetrics and Gynecology

## 2021-06-23 ENCOUNTER — Other Ambulatory Visit: Payer: Self-pay

## 2021-06-23 DIAGNOSIS — Z20822 Contact with and (suspected) exposure to covid-19: Secondary | ICD-10-CM | POA: Insufficient documentation

## 2021-06-23 DIAGNOSIS — Z01812 Encounter for preprocedural laboratory examination: Secondary | ICD-10-CM | POA: Insufficient documentation

## 2021-06-23 LAB — SARS CORONAVIRUS 2 (TAT 6-24 HRS): SARS Coronavirus 2: NEGATIVE

## 2021-06-24 ENCOUNTER — Encounter: Payer: Self-pay | Admitting: Obstetrics and Gynecology

## 2021-06-24 ENCOUNTER — Other Ambulatory Visit: Payer: Self-pay

## 2021-06-24 ENCOUNTER — Inpatient Hospital Stay: Payer: Medicaid Other | Admitting: Anesthesiology

## 2021-06-24 ENCOUNTER — Inpatient Hospital Stay
Admission: AD | Admit: 2021-06-24 | Discharge: 2021-06-25 | DRG: 807 | Disposition: A | Discharge status: Home / Self Care | Payer: Medicaid Other | Attending: Obstetrics and Gynecology | Admitting: Obstetrics and Gynecology

## 2021-06-24 DIAGNOSIS — Z30430 Encounter for insertion of intrauterine contraceptive device: Secondary | ICD-10-CM | POA: Diagnosis not present

## 2021-06-24 DIAGNOSIS — O99345 Other mental disorders complicating the puerperium: Secondary | ICD-10-CM

## 2021-06-24 DIAGNOSIS — F1290 Cannabis use, unspecified, uncomplicated: Secondary | ICD-10-CM

## 2021-06-24 DIAGNOSIS — O99210 Obesity complicating pregnancy, unspecified trimester: Secondary | ICD-10-CM | POA: Diagnosis present

## 2021-06-24 DIAGNOSIS — O99324 Drug use complicating childbirth: Secondary | ICD-10-CM

## 2021-06-24 DIAGNOSIS — O99214 Obesity complicating childbirth: Secondary | ICD-10-CM | POA: Diagnosis not present

## 2021-06-24 DIAGNOSIS — Z20822 Contact with and (suspected) exposure to covid-19: Secondary | ICD-10-CM | POA: Diagnosis present

## 2021-06-24 DIAGNOSIS — F17210 Nicotine dependence, cigarettes, uncomplicated: Secondary | ICD-10-CM | POA: Diagnosis present

## 2021-06-24 DIAGNOSIS — O99334 Smoking (tobacco) complicating childbirth: Secondary | ICD-10-CM | POA: Diagnosis present

## 2021-06-24 DIAGNOSIS — O99344 Other mental disorders complicating childbirth: Secondary | ICD-10-CM | POA: Diagnosis not present

## 2021-06-24 DIAGNOSIS — Z01818 Encounter for other preprocedural examination: Secondary | ICD-10-CM

## 2021-06-24 DIAGNOSIS — O99320 Drug use complicating pregnancy, unspecified trimester: Secondary | ICD-10-CM

## 2021-06-24 DIAGNOSIS — O0930 Supervision of pregnancy with insufficient antenatal care, unspecified trimester: Secondary | ICD-10-CM

## 2021-06-24 DIAGNOSIS — Z3A39 39 weeks gestation of pregnancy: Secondary | ICD-10-CM | POA: Diagnosis not present

## 2021-06-24 DIAGNOSIS — F411 Generalized anxiety disorder: Secondary | ICD-10-CM | POA: Diagnosis present

## 2021-06-24 DIAGNOSIS — O0933 Supervision of pregnancy with insufficient antenatal care, third trimester: Secondary | ICD-10-CM | POA: Diagnosis not present

## 2021-06-24 LAB — CBC
HCT: 32 % — ABNORMAL LOW (ref 36.0–46.0)
Hemoglobin: 11.5 g/dL — ABNORMAL LOW (ref 12.0–15.0)
MCH: 29.3 pg (ref 26.0–34.0)
MCHC: 35.9 g/dL (ref 30.0–36.0)
MCV: 81.6 fL (ref 80.0–100.0)
Platelets: 190 10*3/uL (ref 150–400)
RBC: 3.92 MIL/uL (ref 3.87–5.11)
RDW: 13.3 % (ref 11.5–15.5)
WBC: 13.5 10*3/uL — ABNORMAL HIGH (ref 4.0–10.5)
nRBC: 0 % (ref 0.0–0.2)

## 2021-06-24 LAB — TYPE AND SCREEN
ABO/RH(D): O POS
Antibody Screen: NEGATIVE

## 2021-06-24 LAB — RPR: RPR Ser Ql: NONREACTIVE

## 2021-06-24 MED ORDER — LACTATED RINGERS IV SOLN
500.0000 mL | Freq: Once | INTRAVENOUS | Status: AC
  Administered 2021-06-24: 500 mL via INTRAVENOUS

## 2021-06-24 MED ORDER — ONDANSETRON HCL 4 MG PO TABS
4.0000 mg | ORAL_TABLET | ORAL | Status: DC | PRN
  Filled 2021-06-24: qty 1

## 2021-06-24 MED ORDER — FENTANYL-BUPIVACAINE-NACL 0.5-0.125-0.9 MG/250ML-% EP SOLN
EPIDURAL | Status: AC
  Filled 2021-06-24: qty 250

## 2021-06-24 MED ORDER — SENNOSIDES-DOCUSATE SODIUM 8.6-50 MG PO TABS
2.0000 | ORAL_TABLET | Freq: Every day | ORAL | Status: DC
  Administered 2021-06-25: 2 via ORAL
  Filled 2021-06-24: qty 2

## 2021-06-24 MED ORDER — LACTATED RINGERS IV SOLN: 500.0000 mL | INTRAVENOUS | Status: DC | PRN

## 2021-06-24 MED ORDER — TERBUTALINE SULFATE 1 MG/ML IJ SOLN: 0.2500 mg | Freq: Once | INTRAMUSCULAR | Status: DC | PRN

## 2021-06-24 MED ORDER — ACETAMINOPHEN 325 MG PO TABS
650.0000 mg | ORAL_TABLET | ORAL | Status: DC | PRN
  Administered 2021-06-24 – 2021-06-25 (×4): 650 mg via ORAL
  Filled 2021-06-24 (×5): qty 2

## 2021-06-24 MED ORDER — LIDOCAINE HCL (PF) 1 % IJ SOLN
INTRAMUSCULAR | Status: AC
  Filled 2021-06-24: qty 30

## 2021-06-24 MED ORDER — MISOPROSTOL 200 MCG PO TABS
ORAL_TABLET | ORAL | Status: AC
  Filled 2021-06-24: qty 4

## 2021-06-24 MED ORDER — OXYTOCIN-SODIUM CHLORIDE 30-0.9 UT/500ML-% IV SOLN
2.5000 [IU]/h | INTRAVENOUS | Status: DC
  Filled 2021-06-24: qty 500

## 2021-06-24 MED ORDER — DIPHENHYDRAMINE HCL 25 MG PO CAPS: 25.0000 mg | ORAL_CAPSULE | Freq: Four times a day (QID) | ORAL | Status: DC | PRN

## 2021-06-24 MED ORDER — BUTORPHANOL TARTRATE 1 MG/ML IJ SOLN
1.0000 mg | INTRAMUSCULAR | Status: DC | PRN
Start: 2021-06-24 — End: 2021-06-24
  Administered 2021-06-24: 1 mg via INTRAVENOUS
  Filled 2021-06-24: qty 1

## 2021-06-24 MED ORDER — EPHEDRINE 5 MG/ML INJ: 10.0000 mg | INTRAVENOUS | Status: DC | PRN

## 2021-06-24 MED ORDER — LIDOCAINE HCL (PF) 1 % IJ SOLN: 30.0000 mL | INTRAMUSCULAR | Status: DC | PRN

## 2021-06-24 MED ORDER — DIPHENHYDRAMINE HCL 50 MG/ML IJ SOLN
12.5000 mg | INTRAMUSCULAR | Status: DC | PRN
Start: 2021-06-24 — End: 2021-06-24

## 2021-06-24 MED ORDER — SODIUM CHLORIDE 0.9 % IV SOLN
INTRAVENOUS | Status: DC | PRN
  Administered 2021-06-24 (×3): 5 mL via EPIDURAL

## 2021-06-24 MED ORDER — OXYTOCIN-SODIUM CHLORIDE 30-0.9 UT/500ML-% IV SOLN
1.0000 m[IU]/min | INTRAVENOUS | Status: DC
  Filled 2021-06-24: qty 500

## 2021-06-24 MED ORDER — ONDANSETRON HCL 4 MG/2ML IJ SOLN: 4.0000 mg | Freq: Four times a day (QID) | INTRAMUSCULAR | Status: DC | PRN

## 2021-06-24 MED ORDER — IBUPROFEN 600 MG PO TABS
600.0000 mg | ORAL_TABLET | Freq: Four times a day (QID) | ORAL | Status: DC
  Administered 2021-06-24 – 2021-06-25 (×5): 600 mg via ORAL
  Filled 2021-06-24 (×5): qty 1

## 2021-06-24 MED ORDER — PRENATAL MULTIVITAMIN CH
1.0000 | ORAL_TABLET | Freq: Every day | ORAL | Status: DC
  Administered 2021-06-24 – 2021-06-25 (×2): 1 via ORAL
  Filled 2021-06-24 (×2): qty 1

## 2021-06-24 MED ORDER — BENZOCAINE-MENTHOL 20-0.5 % EX AERO
1.0000 "application " | INHALATION_SPRAY | CUTANEOUS | Status: DC | PRN
  Administered 2021-06-24: 1 via TOPICAL
  Filled 2021-06-24: qty 56

## 2021-06-24 MED ORDER — SIMETHICONE 80 MG PO CHEW: 80.0000 mg | CHEWABLE_TABLET | ORAL | Status: DC | PRN

## 2021-06-24 MED ORDER — OXYCODONE-ACETAMINOPHEN 5-325 MG PO TABS: 1.0000 | ORAL_TABLET | ORAL | Status: DC | PRN

## 2021-06-24 MED ORDER — BUTORPHANOL TARTRATE 1 MG/ML IJ SOLN
2.0000 mg | INTRAMUSCULAR | Status: DC | PRN
  Administered 2021-06-24: 2 mg via INTRAVENOUS
  Filled 2021-06-24: qty 2

## 2021-06-24 MED ORDER — LEVONORGESTREL 19.5 MG IU IUD
1.0000 | INTRAUTERINE_SYSTEM | Freq: Once | INTRAUTERINE | Status: AC
  Administered 2021-06-24: 1 via INTRAUTERINE
  Filled 2021-06-24 (×2): qty 1

## 2021-06-24 MED ORDER — LIDOCAINE-EPINEPHRINE (PF) 1.5 %-1:200000 IJ SOLN
INTRAMUSCULAR | Status: DC | PRN
  Administered 2021-06-24: 3 mL

## 2021-06-24 MED ORDER — COCONUT OIL OIL: 1.0000 "application " | TOPICAL_OIL | Status: DC | PRN

## 2021-06-24 MED ORDER — OXYCODONE-ACETAMINOPHEN 5-325 MG PO TABS: 2.0000 | ORAL_TABLET | ORAL | Status: DC | PRN

## 2021-06-24 MED ORDER — OXYTOCIN 10 UNIT/ML IJ SOLN
INTRAMUSCULAR | Status: AC
  Filled 2021-06-24: qty 2

## 2021-06-24 MED ORDER — LACTATED RINGERS IV SOLN: INTRAVENOUS | Status: DC

## 2021-06-24 MED ORDER — LEVONORGESTREL 20.1 MCG/DAY IU IUD
1.0000 | INTRAUTERINE_SYSTEM | Freq: Once | INTRAUTERINE | Status: DC
  Filled 2021-06-24 (×2): qty 1

## 2021-06-24 MED ORDER — AMMONIA AROMATIC IN INHA
RESPIRATORY_TRACT | Status: AC
  Filled 2021-06-24: qty 10

## 2021-06-24 MED ORDER — TETANUS-DIPHTH-ACELL PERTUSSIS 5-2.5-18.5 LF-MCG/0.5 IM SUSY: 0.5000 mL | PREFILLED_SYRINGE | Freq: Once | INTRAMUSCULAR | Status: DC

## 2021-06-24 MED ORDER — PHENYLEPHRINE 40 MCG/ML (10ML) SYRINGE FOR IV PUSH (FOR BLOOD PRESSURE SUPPORT): 80.0000 ug | PREFILLED_SYRINGE | INTRAVENOUS | Status: DC | PRN

## 2021-06-24 MED ORDER — SOD CITRATE-CITRIC ACID 500-334 MG/5ML PO SOLN: 30.0000 mL | ORAL | Status: DC | PRN

## 2021-06-24 MED ORDER — DIBUCAINE (PERIANAL) 1 % EX OINT: 1.0000 "application " | TOPICAL_OINTMENT | CUTANEOUS | Status: DC | PRN

## 2021-06-24 MED ORDER — ONDANSETRON HCL 4 MG/2ML IJ SOLN: 4.0000 mg | INTRAMUSCULAR | Status: DC | PRN

## 2021-06-24 MED ORDER — ACETAMINOPHEN 325 MG PO TABS: 650.0000 mg | ORAL_TABLET | ORAL | Status: DC | PRN

## 2021-06-24 MED ORDER — OXYTOCIN BOLUS FROM INFUSION
333.0000 mL | Freq: Once | INTRAVENOUS | Status: AC
  Administered 2021-06-24: 333 mL via INTRAVENOUS

## 2021-06-24 MED ORDER — FENTANYL-BUPIVACAINE-NACL 0.5-0.125-0.9 MG/250ML-% EP SOLN
12.0000 mL/h | EPIDURAL | Status: DC | PRN
Start: 2021-06-24 — End: 2021-06-24
  Administered 2021-06-24: 12 mL/h via EPIDURAL

## 2021-06-24 MED ORDER — HYDROXYZINE HCL 25 MG PO TABS
50.0000 mg | ORAL_TABLET | Freq: Four times a day (QID) | ORAL | Status: DC | PRN
  Administered 2021-06-24 (×2): 50 mg via ORAL
  Filled 2021-06-24: qty 2
  Filled 2021-06-24 (×2): qty 1

## 2021-06-24 MED ORDER — WITCH HAZEL-GLYCERIN EX PADS: 1.0000 "application " | MEDICATED_PAD | CUTANEOUS | Status: DC | PRN

## 2021-06-24 MED ORDER — ZOLPIDEM TARTRATE 5 MG PO TABS: 5.0000 mg | ORAL_TABLET | Freq: Every evening | ORAL | Status: DC | PRN

## 2021-06-24 MED ORDER — MISOPROSTOL 50MCG HALF TABLET
50.0000 ug | ORAL_TABLET | ORAL | Status: DC | PRN
  Administered 2021-06-24 (×2): 50 ug via VAGINAL
  Filled 2021-06-24 (×2): qty 1

## 2021-06-24 NOTE — Anesthesia Preprocedure Evaluation (Signed)
Anesthesia Evaluation  Patient identified by MRN, date of birth, ID band Patient awake    Reviewed: Allergy & Precautions, NPO status , Patient's Chart, lab work & pertinent test results  History of Anesthesia Complications Negative for: history of anesthetic complications  Airway Mallampati: II  TM Distance: >3 FB Neck ROM: Full    Dental  (+) Poor Dentition, Missing   Pulmonary neg pulmonary ROS, neg sleep apnea, neg COPD, Current Smoker and Patient abstained from smoking.,    breath sounds clear to auscultation- rhonchi (-) wheezing      Cardiovascular Exercise Tolerance: Good (-) hypertension(-) CAD and (-) Past MI  Rhythm:Regular Rate:Normal - Systolic murmurs and - Diastolic murmurs    Neuro/Psych  Headaches, PSYCHIATRIC DISORDERS Anxiety Depression    GI/Hepatic negative GI ROS, Neg liver ROS,   Endo/Other  negative endocrine ROSneg diabetes  Renal/GU negative Renal ROS     Musculoskeletal negative musculoskeletal ROS (+)   Abdominal (+) + obese,   Peds  Hematology negative hematology ROS (+)   Anesthesia Other Findings Past Medical History: No date: Anxiety No date: No pertinent past medical history   Reproductive/Obstetrics (+) Pregnancy                             Lab Results  Component Value Date   WBC 13.5 (H) 06/24/2021   HGB 11.5 (L) 06/24/2021   HCT 32.0 (L) 06/24/2021   MCV 81.6 06/24/2021   PLT 190 06/24/2021    Anesthesia Physical Anesthesia Plan  ASA: 2  Anesthesia Plan: Epidural   Post-op Pain Management:    Induction:   PONV Risk Score and Plan: 1  Airway Management Planned:   Additional Equipment:   Intra-op Plan:   Post-operative Plan:   Informed Consent: I have reviewed the patients History and Physical, chart, labs and discussed the procedure including the risks, benefits and alternatives for the proposed anesthesia with the patient or  authorized representative who has indicated his/her understanding and acceptance.       Plan Discussed with: CRNA and Anesthesiologist  Anesthesia Plan Comments: (Plan for epidural for labor, discussed epidural vs spinal vs GA if need for csection)        Anesthesia Quick Evaluation

## 2021-06-24 NOTE — Procedures (Signed)
  Post-Placental IUD Insertion Procedure Note Patient identified, informed consent performed, consent signed.  Discussed risks of irregular bleeding, cramping, infection, malpositioning or misplacement of the IUD outside the uterus which may require further procedure such as laparoscopy. Also discussed >99% contraception efficacy, increased risk of ectopic pregnancy with failure of method.   Emphasized that this did not protect against STIs, condoms recommended during all sexual encounters. Advised on higher expulsion rate, up to 40% with post-placental IUD insertion.   Patient was noted to have adequate epidural anesthesia postpartum.  A sterile speculum was placed in the vagina.  The cervix was visualized and grasped anteriorly with a ring forceps.  The [facility] IUD was removed from the insertion device and grasped just slightly below the arms with a ring forceps.  The IUD was then placed at the fundal region per recommendations and the ring forceps was retracted.  Strings trimmed to level of external cervical os. Ring forceps was removed from the anterior aspect of the cervix. Patient tolerated procedure well.    Hildred Laser, MD [facility]

## 2021-06-24 NOTE — Anesthesia Procedure Notes (Signed)
Epidural Patient location during procedure: OB Start time: 06/24/2021 9:45 AM End time: 06/24/2021 10:05 AM  Staffing Anesthesiologist: Alver Fisher, MD Performed: anesthesiologist   Preanesthetic Checklist Completed: patient identified, IV checked, site marked, risks and benefits discussed, surgical consent, monitors and equipment checked, pre-op evaluation and timeout performed  Epidural Patient position: sitting Prep: ChloraPrep Patient monitoring: heart rate, continuous pulse ox and blood pressure Approach: midline Location: L4-L5 Injection technique: LOR saline  Needle:  Needle type: Tuohy  Needle gauge: 18 G Needle length: 9 cm and 9 Needle insertion depth: 6 cm Catheter type: closed end flexible Catheter size: 20 Guage Catheter at skin depth: 10 cm Test dose: negative (0.125% bupivacaine)  Assessment Events: blood not aspirated, injection not painful, no injection resistance, no paresthesia and negative IV test  Additional Notes   Patient tolerated the insertion well without complications.Reason for block:procedure for pain

## 2021-06-24 NOTE — H&P (Signed)
Obstetric History and Physical  Terri [name] is a 25 y.o. [phone] with IUP at [redacted]w[redacted]d presenting for scheduled IOL for morbid obesity in pregnancy. Patient states she has been having  occasional contractions,  no  vaginal bleeding, intact membranes, with active fetal movement.    Prenatal Course Source of Care: [facility] with onset of care at 17 weeks (late entry to care, limited [facility], 4 visits) Pregnancy complications or risks: Patient Active Problem List   Diagnosis Date Noted   Obesity during pregnancy, antepartum 06/24/2021   Left hand pain 10/30/2020   Arthralgia of both knees and bilateral hands.  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    Prenatal labs and studies: ABO, Rh: --/--/O POS (10/25 0101) Antibody: NEG (10/25 0101) Rubella: 1.14 (05/20 1532) RPR: Non Reactive (10/11 0938)  HBsAg: Negative (05/20 1532)  HIV: Non Reactive (05/20 1532)  KZS:WFUXNATF/-- (10/11 0959) 1 hr Glucola  not performed due to limited PNC, normal A1c in 3rd trimester (5.3 on 10/11) Genetic screening normal Anatomy US normal   Past Medical History:  Diagnosis Date   Anxiety    No pertinent past medical history     Past Surgical History:  Procedure Laterality Date   CHOLECYSTECTOMY N/A 11/04/2018   Procedure: LAPAROSCOPIC CHOLECYSTECTOMY;  Surgeon: Henrene Dodge, MD;  Location: [facility] ORS;  Service: General;  Laterality: N/A;   DILATION AND EVACUATION N/A 07/13/2017   Procedure: DILATATION AND EVACUATION;  Surgeon: Ward, Elenora Fender, MD;  Location: [facility] ORS;  Service: Gynecology;  Laterality: N/A;   ERCP N/A  11/14/2018   Procedure: ENDOSCOPIC RETROGRADE CHOLANGIOPANCREATOGRAPHY (ERCP);  Surgeon: Midge Minium, MD;  Location: [facility] ENDOSCOPY;  Service: Endoscopy;  Laterality: N/A;    OB History  Gravida Para Term Preterm AB Living  5 2 2   2 2   SAB IAB Ectopic Multiple Live Births  1       2    # Outcome Date GA Lbr Len/2nd Weight Sex Delivery Anes PTL Lv  5 Current           4 Term 09/15/14   3487 g F Vag-Spont  N LIV  3 Term 12/01/12   2948 g M Vag-Spont  N LIV  2 SAB           1 AB             Social History   Socioeconomic History   Marital status: Single    Spouse name: Not on file   Number of children: 2   Years of education: Not on file   Highest education level: Not on file  Occupational History   Occupation: SAHM  Tobacco Use   Smoking status: Every Day    Packs/day: 0.50    Years: 8.00    Pack years: 4.00    Types: Cigarettes   Smokeless tobacco: Never  Vaping Use   Vaping Use: Never used  Substance and Sexual Activity   Alcohol use: No   Drug use: No   Sexual activity: Yes    Partners: Male    Birth control/protection: None  Other Topics Concern   Not on file  Social History Narrative  Not on file   Social Determinants of Health   Financial Resource Strain: Not on file  Food Insecurity: Not on file  Transportation Needs: Not on file  Physical Activity: Not on file  Stress: Not on file  Social Connections: Not on file    Family History  Problem Relation Age of Onset   Arthritis Mother    Thyroid disease Mother    Depression Mother    Anxiety disorder Mother    Anxiety disorder Maternal Grandmother    Depression Maternal Grandmother    COPD Maternal Grandmother    ALS Maternal Grandfather    Breast cancer Neg Hx    Colon cancer Neg Hx     Medications Prior to Admission  Medication Sig Dispense Refill Last Dose   Prenatal Vit-Fe Fumarate-FA (MULTIVITAMIN-PRENATAL) 27-0.8 MG TABS tablet Take 1 tablet by mouth daily at 12 noon.   Past Month     Allergies  Allergen Reactions   Sulfa Antibiotics Other (See Comments)    Reaction: unknown.  Told allergic as child but believes she's taken since.   Zithromax [Azithromycin] Other (See Comments)    Reaction: unknown Told reaction as a child    Review of Systems: Negative except for what is mentioned in HPI.  Physical Exam: BP 130/81 (BP Location: Left Arm)   Pulse 84   Temp 97.6 F (36.4 C) (Oral)   Resp 18   Ht 5\' 3"  (1.6 m)   Wt 108 kg   LMP 10/19/2020 (Approximate)   BMI 42.16 kg/m  CONSTITUTIONAL: Well-developed, well-nourished female in mild distress with contractions.  HENT:  Normocephalic, atraumatic, External right and left ear normal. Oropharynx is clear and moist EYES: Conjunctivae and EOM are normal. Pupils are equal, round, and reactive to light. No scleral icterus.  NECK: Normal range of motion, supple, no masses SKIN: Skin is warm and dry. No rash noted. Not diaphoretic. No erythema. No pallor. NEUROLOGIC: Alert and oriented to person, place, and time. Normal reflexes, muscle tone coordination. No cranial nerve deficit noted. PSYCHIATRIC: Anxious mood. Normal behavior. Normal judgment and thought content. CARDIOVASCULAR: Normal heart rate noted, regular rhythm RESPIRATORY: Effort and breath sounds normal, no problems with respiration noted ABDOMEN: Soft, nontender, nondistended, gravid. MUSCULOSKELETAL: Normal range of motion. No edema and no tenderness. 2+ distal pulses.  Cervical Exam: Dilatation 4 cm   Effacement 50%   Station -3   Presentation: cephalic FHT:  Baseline rate 130 bpm   Variability moderate  Accelerations present   Decelerations none Contractions: Every 2-3 mins   Pertinent Labs/Studies:   Results for orders placed or performed during the hospital encounter of 06/24/21 (from the past 24 hour(s))  CBC     Status: Abnormal   Collection Time: 06/24/21  1:01 AM  Result Value Ref Range   WBC 13.5 (H) 4.0 - 10.5 K/uL   RBC 3.92 3.87 - 5.11  MIL/uL   Hemoglobin 11.5 (L) 12.0 - 15.0 g/dL   HCT 06/26/21 (L) 45.4 - 09.8 %   MCV 81.6 80.0 - 100.0 fL   MCH 29.3 26.0 - 34.0 pg   MCHC 35.9 30.0 - 36.0 g/dL   RDW 11.9 14.7 - 82.9 %   Platelets 190 150 - 400 K/uL   nRBC 0.0 0.0 - 0.2 %  Type and screen     Status: None   Collection Time: 06/24/21  1:01 AM  Result Value Ref Range   ABO/RH(D) O POS    Antibody Screen NEG    Sample  Expiration      06/27/2021,2359 Performed at [facility], [address] [place], [place] [address]     Assessment : Terri [name] is a 25 y.o. [id number] at [redacted]w[redacted]d being admitted for induction of labor due to morbid obesity in pregnancy.  Limited prenatal care, late entry to care.   Plan: Labor:  Induction as ordered as per protocol with Cytotec, has received second dose at 0530. Analgesia as needed. Given Hydroxyzine for anxiety.  FWB: Reassuring fetal heart tracing.  GBS negative.   Delivery plan: Hopeful for vaginal delivery   Hildred Laser, MD [facility]

## 2021-06-24 NOTE — Discharge Summary (Signed)
Postpartum Discharge Summary      Patient Name: Terri [name] DOB: 1996/02/21 MRN: [id number]  Date of admission: 06/24/2021 Delivery date:06/24/2021  Delivering provider: Rubie Maid  Date of discharge: 06/25/2021  Admitting diagnosis: Obesity during pregnancy, antepartum [O99.210] Intrauterine pregnancy: [redacted]w[redacted]d    Secondary diagnosis:  Active Problems:   GAD (generalized anxiety disorder)   Obesity during pregnancy, antepartum   Insufficient prenatal care   Late prenatal care affecting pregnancy in third trimester   Marijuana use during pregnancy  Additional problems: None    Discharge diagnosis: Term Pregnancy Delivered, Generalized anxiety disorder                                           Post partum procedures: None Augmentation: Cytotec Complications: None  Hospital course: Induction of Labor With Vaginal Delivery   25y.o. yo [id number] admitted to the hospital 06/24/2021 for induction of labor.  Indication for induction:  Obesity (morbid) .  Patient had an uncomplicated labor course as follows: Membrane Rupture Time/Date: 9:30 AM ,06/24/2021   Delivery Method:Vaginal, Spontaneous  Episiotomy: None  Lacerations:  None  Details of delivery can be found in separate delivery note.  Patient had a routine postpartum course. Patient is discharged home 06/24/21.  Newborn Data: Birth date:06/24/2021  Birth time:10:33 AM  Gender:Female  Living status:Living  Apgars:7 ,9  Weight:3110 g   Magnesium Sulfate received: No BMZ received: No Rhophylac:No MMR:No T-DaP:Given prenatally Flu: No Transfusion:No  Physical exam  Vitals:   06/24/21 1215 06/24/21 1430 06/24/21 1525 06/24/21 1944  BP:  121/79 104/65 121/82  Pulse:  72 77 81  Resp:  _0 Temp: 98 F (36.7 C) 98.5 F (36.9 C) 98.4 F (36.9 C) 98.6 F (37 C)  TempSrc: Oral Oral  Oral  SpO2:  99% 97% 97%  Weight:      Height:       General: alert, cooperative, and no  distress Lochia: appropriate Uterine Fundus: firm Incision: N/A DVT Evaluation: No evidence of DVT seen on physical exam. Negative Homan's sign. No cords or calf tenderness. No significant calf/ankle edema. Labs: Lab Results  Component Value Date   WBC 13.5 (H) 06/24/2021   HGB 11.5 (L) 06/24/2021   HCT 32.0 (L) 06/24/2021   MCV 81.6 06/24/2021   PLT 190 06/24/2021   CMP Latest Ref Rng & Units 05/30/2021  Glucose 70 - 99 mg/dL 103(H)  BUN 6 - 20 mg/dL 6  Creatinine 0.44 - 1.00 mg/dL 0.47  Sodium 135 - 145 mmol/L 135  Potassium 3.5 - 5.1 mmol/L 4.0  Chloride 98 - 111 mmol/L 105  CO2 22 - 32 mmol/L 22  Calcium 8.9 - 10.3 mg/dL 8.1(L)  Total Protein 6.5 - 8.1 g/dL 6.9  Total Bilirubin 0.3 - 1.2 mg/dL 0.5  Alkaline Phos 38 - 126 U/L 154(H)  AST 15 - 41 U/L 14(L)  ALT 0 - 44 U/L 11   Edinburgh Score: Edinburgh Postnatal Depression Scale Screening Tool 06/24/2021  I have been able to laugh and see the funny side of things. 0  I have looked forward with enjoyment to things. 1  I have blamed myself unnecessarily when things went wrong. 0  I have been anxious or worried for no good reason. 3  I have felt scared or panicky for no good reason. 3  Things  have been getting on top of me. 1  I have been so unhappy that I have had difficulty sleeping. 0  I have felt sad or miserable. 1  I have been so unhappy that I have been crying. 0  The thought of harming myself has occurred to me. 0  Edinburgh Postnatal Depression Scale Total 9     GAD-7 score is 15 (see scanned document).    After visit meds:  Allergies as of 06/25/2021       Reactions   Sulfa Antibiotics Other (See Comments)   Reaction: unknown.  Told allergic as child but believes she's taken since.   Zithromax [azithromycin] Other (See Comments)   Reaction: unknown Told reaction as a child        Medication List     TAKE these medications    busPIRone 5 MG tablet Commonly known as: BUSPAR Take 1 tablet (5  mg total) by mouth 3 (three) times daily.   ibuprofen 600 MG tablet Commonly known as: ADVIL Take 1 tablet (600 mg total) by mouth every 6 (six) hours as needed.   multivitamin-prenatal 27-0.8 MG Tabs tablet Take 1 tablet by mouth daily at 12 noon.        Discharge home in stable condition Infant Feeding: Bottle and Breast Infant Disposition:home with mother Discharge instruction: per After Visit Summary and Postpartum booklet. Activity: Advance as tolerated. Pelvic rest for 6 weeks.  Diet: routine diet Anticipated Birth Control: PP IUD placed Postpartum Appointment:6 weeks Additional Postpartum F/U: Postpartum Depression checkup/Anxiety checkup Future Appointments:No future appointments. Follow up Visit:  Follow-up Information     Rubie Maid, MD Follow up.   Specialties: Obstetrics and Gynecology, Radiology Why: 2 weeks postpartum mood check (televisit) 6 week postpartum visit Contact information: [address] [place] [address] [phone]                     06/25/2021 Rubie Maid, MD

## 2021-06-25 LAB — CBC
HCT: 28.9 % — ABNORMAL LOW (ref 36.0–46.0)
Hemoglobin: 10.3 g/dL — ABNORMAL LOW (ref 12.0–15.0)
MCH: 29.3 pg (ref 26.0–34.0)
MCHC: 35.6 g/dL (ref 30.0–36.0)
MCV: 82.3 fL (ref 80.0–100.0)
Platelets: 141 10*3/uL — ABNORMAL LOW (ref 150–400)
RBC: 3.51 MIL/uL — ABNORMAL LOW (ref 3.87–5.11)
RDW: 13.5 % (ref 11.5–15.5)
WBC: 9.5 10*3/uL (ref 4.0–10.5)
nRBC: 0 % (ref 0.0–0.2)

## 2021-06-25 MED ORDER — IBUPROFEN 600 MG PO TABS: 600.0000 mg | ORAL_TABLET | Freq: Four times a day (QID) | ORAL | 1 refills | Status: DC | PRN

## 2021-06-25 MED ORDER — BUSPIRONE HCL 5 MG PO TABS: 5.0000 mg | ORAL_TABLET | Freq: Three times a day (TID) | ORAL | 1 refills | Status: DC

## 2021-06-25 NOTE — TOC Initial Note (Signed)
Transition of Care [facility]) - Initial/Assessment Note    Patient Details  Name: Terri [name] MRN: [id number] Date of Birth: 18-Mar-1996  [facility]) CM/SW Contact:    Orchard Cellar, RN Phone Number: 06/25/2021, 12:42 PM  Clinical Narrative:                 Spoke to patient and husband at bedside. MOB appropriate and excited for new infant. States she has history of anxiety that had been managed with medication and therapy until a few weeks ago when she became anxious regarding delivery. Is working with OB to monitor closely. Patient reports she has strong support system and lives with her partner and 2 other children. Planning to bottlefeed and active with [facility] services. Has follow up appointment with [facility] next week as well as pediatrician appointment. Discussed PPD in detail and resources if needed. No history of PPD. Patient confirmed Marijuana use during pregnancy to cope with anxiety. Confirmed late [facility] due to starting a new job at the beginning of the pregnancy and fear of requesting days off. Reports no needs and has all equipment including car seat. No other issues and patient eager to discharge.         Patient Goals and CMS Choice        Expected Discharge Plan and Services           Expected Discharge Date: 06/25/21                                    Prior Living Arrangements/Services                       Activities of Daily Living Home Assistive Devices/Equipment: None ADL Screening (condition at time of admission) Patient's cognitive ability adequate to safely complete daily activities?: Yes Is the patient deaf or have difficulty hearing?: No Does the patient have difficulty seeing, even when wearing glasses/contacts?: No Does the patient have difficulty concentrating, remembering, or making decisions?: No Patient able to express need for assistance with ADLs?: Yes Does the patient have difficulty dressing or bathing?:  No Independently performs ADLs?: Yes (appropriate for developmental age) Does the patient have difficulty walking or climbing stairs?: No Weakness of Legs: None Weakness of Arms/Hands: None  Permission Sought/Granted                  Emotional Assessment              Admission diagnosis:  Obesity during pregnancy, antepartum [O99.210] Patient Active Problem List   Diagnosis Date Noted   Obesity during pregnancy, antepartum 06/24/2021   Insufficient prenatal care 06/24/2021   Late prenatal care affecting pregnancy in third trimester 06/24/2021   Marijuana use during pregnancy 06/24/2021   Left hand pain 10/30/2020   Arthralgia of both knees and bilateral hands.  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 ([address]), [place] - [address] [place]) [place] [address] Phone: [phone]  Fax: [phone]     Social Determinants of Health (SDOH) Interventions    Readmission Risk Interventions No flowsheet data found.

## 2021-06-25 NOTE — Progress Notes (Signed)
Pt discharged with infant.  Discharge instructions, prescriptions and follow up appointment given to and reviewed with pt. Pt verbalized understanding. Escorted out by auxillary. 

## 2021-06-25 NOTE — Anesthesia Postprocedure Evaluation (Signed)
Anesthesia Post Note  Patient: Terri [name]  Procedure(s) Performed: AN AD HOC LABOR EPIDURAL  Patient location during evaluation: Mother Baby Anesthesia Type: Epidural Level of consciousness: awake and alert Pain management: pain level controlled Vital Signs Assessment: post-procedure vital signs reviewed and stable Respiratory status: spontaneous breathing, nonlabored ventilation and respiratory function stable Cardiovascular status: stable Postop Assessment: no headache, no backache and epidural receding Anesthetic complications: no   No notable events documented.   Last Vitals:  Vitals:   06/25/21 0310 06/25/21 0801  BP: 111/76 122/84  Pulse: 87 78  Resp: 18 20  Temp: 36.9 C 36.7 C  SpO2: 100% 99%    Last Pain:  Vitals:   06/25/21 1145  TempSrc:   PainSc: 0-No pain                 Malachi Pro C

## 2021-06-25 NOTE — Progress Notes (Signed)
Post Partum Day # 1, s/p SVD  Subjective: no complaints, up ad lib, voiding, and tolerating PO. Reports that she has decided not to breast feed.  Will formula feed only. Notes light to moderate bleeding. Is still noting some anxiety.   Objective: Temp:  [98 F (36.7 C)-98.6 F (37 C)] 98 F (36.7 C) (10/26 0801) Pulse Rate:  [68-87] 78 (10/26 0801) Resp:  [18-20] 20 (10/26 0801) BP: (104-130)/(65-89) 122/84 (10/26 0801) SpO2:  [97 %-100 %] 99 % (10/26 0801)  Physical Exam:  General: alert and no distress  Lungs: clear to auscultation bilaterally Breasts: normal appearance, no masses or tenderness Heart: regular rate and rhythm, S1, S2 normal, no murmur, click, rub or gallop Abdomen: soft, non-tender; bowel sounds normal; no masses,  no organomegaly Pelvis: Lochia: appropriate, Uterine Fundus: firm Extremities: DVT Evaluation: No evidence of DVT seen on physical exam. Negative Homan's sign. No cords or calf tenderness. No significant calf/ankle edema.  Recent Labs    06/24/21 0101 06/25/21 0512  HGB 11.5* 10.3*  HCT 32.0* 28.9*      Assessment/Plan: - Doing well postpartum.  - Formula feeding infant - Social Work consult for limited [facility].  - Contraception: post-placental IUD placed yesterday.  - Circumcision declined - History of anxiety disorder, currently on no meds. GAD score is 15. Will start Buspar 5 mg (noted that Atarax did not help much while inpatient).  - Discharge home today per patient request.     LOS: 1 day   Hildred Laser, MD [facility]

## 2021-06-25 NOTE — Progress Notes (Signed)
Patient completed GAD screening, score of 15.  MD notified.

## 2021-06-26 ENCOUNTER — Telehealth: Payer: Self-pay

## 2021-06-26 NOTE — Telephone Encounter (Signed)
Transition Care Management Unsuccessful Follow-up Telephone Call  Date of discharge and from where:  06/25/2021 from [facility]  Attempts:  1st Attempt  Reason for unsuccessful TCM follow-up call:  Left voice message

## 2021-06-27 NOTE — Telephone Encounter (Signed)
Transition Care Management Unsuccessful Follow-up Telephone Call  Date of discharge and from where:  06/25/2021-[facility]  Attempts:  2nd Attempt  Reason for unsuccessful TCM follow-up call:  Left voice message

## 2021-06-30 NOTE — Telephone Encounter (Signed)
Transition Care Management Unsuccessful Follow-up Telephone Call  Date of discharge and from where:  06/25/2021-[facility]  Attempts:  3rd Attempt  Reason for unsuccessful TCM follow-up call:  Left voice message

## 2021-07-30 ENCOUNTER — Encounter: Payer: Self-pay | Admitting: Obstetrics and Gynecology

## 2021-07-30 ENCOUNTER — Telehealth (INDEPENDENT_AMBULATORY_CARE_PROVIDER_SITE_OTHER): Payer: Medicaid Other | Admitting: Obstetrics and Gynecology

## 2021-07-30 VITALS — Ht 63.0 in | Wt 210.0 lb

## 2021-07-30 DIAGNOSIS — Z1332 Encounter for screening for maternal depression: Secondary | ICD-10-CM

## 2021-07-30 DIAGNOSIS — F53 Postpartum depression: Secondary | ICD-10-CM | POA: Diagnosis not present

## 2021-07-30 DIAGNOSIS — F418 Other specified anxiety disorders: Secondary | ICD-10-CM | POA: Diagnosis not present

## 2021-07-30 DIAGNOSIS — O99345 Other mental disorders complicating the puerperium: Secondary | ICD-10-CM

## 2021-07-30 MED ORDER — HYDROXYZINE HCL 50 MG PO TABS: 50.0000 mg | ORAL_TABLET | Freq: Three times a day (TID) | ORAL | 1 refills | Status: DC | PRN

## 2021-07-30 MED ORDER — PAROXETINE HCL 20 MG PO TABS: 20.0000 mg | ORAL_TABLET | Freq: Every day | ORAL | 0 refills | Status: DC

## 2021-07-30 NOTE — Patient Instructions (Signed)
Paroxetine Tablets °What is this medication? °PAROXETINE (pa ROX e teen) treats depression, anxiety, obsessive-compulsive disorder (OCD), post-traumatic stress disorder (PTSD), and premenstrual dysphoric disorder (PMDD). It increases the amount of serotonin in the brain, a hormone that helps regulate mood. It belongs to a group of medications called SSRIs. °This medicine may be used for other purposes; ask your health care provider or pharmacist if you have questions. °COMMON BRAND NAME(S): Paxil, Pexeva °What should I tell my care team before I take this medication? °They need to know if you have any of these conditions: °Bipolar disorder or a family history of bipolar disorder °Bleeding disorders °Glaucoma °Heart disease °Kidney disease °Liver disease °Low levels of sodium in the blood °Seizures °Suicidal thoughts, plans, or attempt; a previous suicide attempt by you or a family member °Take MAOIs like Carbex, Eldepryl, Marplan, Nardil, and Parnate °Take medications that treat or prevent blood clots °Thyroid disease °An unusual or allergic reaction to paroxetine, other medications, foods, dyes, or preservatives °Pregnant or trying to get pregnant °Breast-feeding °How should I use this medication? °Take this medication by mouth with a glass of water. Follow the directions on the prescription label. You can take it with or without food. Take your medication at regular intervals. Do not take your medication more often than directed. Do not stop taking this medication suddenly except upon the advice of your care team. Stopping this medication too quickly may cause serious side effects or your condition may worsen. °A special MedGuide will be given to you by the pharmacist with each prescription and refill. Be sure to read this information carefully each time. °Talk to your care team regarding the use of this medication in children. Special care may be needed. °Overdosage: If you think you have taken too much of this  medicine contact a poison control center or emergency room at once. °NOTE: This medicine is only for you. Do not share this medicine with others. °What if I miss a dose? °If you miss a dose, take it as soon as you can. If it is almost time for your next dose, take only that dose. Do not take double or extra doses. °What may interact with this medication? °Do not take this medication with any of the following: °Linezolid °MAOIs like Carbex, Eldepryl, Marplan, Nardil, and Parnate °Methylene blue (injected into a vein) °Pimozide °Thioridazine °This medication may also interact with the following: °Alcohol °Amphetamines °Aspirin and aspirin-like medications °Atomoxetine °Certain medications for depression, anxiety, or psychotic disturbances °Certain medications for irregular heart beat like propafenone, flecainide, encainide, and quinidine °Certain medications for migraine headache like almotriptan, eletriptan, frovatriptan, naratriptan, rizatriptan, sumatriptan, zolmitriptan °Cimetidine °Digoxin °Diuretics °Fentanyl °Fosamprenavir °Furazolidone °Isoniazid °Lithium °Medications that treat or prevent blood clots like warfarin, enoxaparin, and dalteparin °Medications for sleep °NSAIDs, medications for pain and inflammation, like ibuprofen or naproxen °Phenobarbital °Phenytoin °Procarbazine °Rasagiline °Ritonavir °Supplements like St. John's wort, kava kava, valerian °Tamoxifen °Tramadol °Tryptophan °This list may not describe all possible interactions. Give your health care provider a list of all the medicines, herbs, non-prescription drugs, or dietary supplements you use. Also tell them if you smoke, drink alcohol, or use illegal drugs. Some items may interact with your medicine. °What should I watch for while using this medication? °Tell your care team if your symptoms do not get better or if they get worse. Visit your care team for regular checks on your progress. Because it may take several weeks to see the full  effects of this medication, it is important   to continue your treatment as prescribed by your care team. °Watch for new or worsening thoughts of suicide or depression. This includes sudden changes in mood, behaviors, or thoughts. These changes can happen at any time but are more common in the beginning of treatment or after a change in dose. Call your care team right away if you experience these thoughts or worsening depression. °Manic episodes may happen in patients with bipolar disorder who take this medication. Watch for changes in feelings or behaviors such as feeling anxious, nervous, agitated, panicky, irritable, hostile, aggressive, impulsive, severely restless, overly excited and hyperactive, or trouble sleeping. These changes can happen at any time but are more common in the beginning of treatment or after a change in dose. Call your care team right away if you notice any of these symptoms. °You may get drowsy or dizzy. Do not drive, use machinery, or do anything that needs mental alertness until you know how this medication affects you. Do not stand or sit up quickly, especially if you are an older patient. This reduces the risk of dizzy or fainting spells. Alcohol may interfere with the effect of this medication. Avoid alcoholic drinks. °Your mouth may get dry. Chewing sugarless gum or sucking hard candy, and drinking plenty of water will help. Contact your care team if the problem does not go away or is severe. °What side effects may I notice from receiving this medication? °Side effects that you should report to your care team as soon as possible: °Allergic reactions--skin rash, itching, hives, swelling of the face, lips, tongue, or throat °Bleeding--bloody or black, tar-like stools, red or dark brown urine, vomiting blood or brown material that looks like coffee grounds, small, red or purple spots on skin, unusual bleeding or bruising °Heart rhythm changes--fast or irregular heartbeat, dizziness,  feeling faint or lightheaded, chest pain, trouble breathing °Low sodium level--muscle weakness, fatigue, dizziness, headache, confusion °Serotonin syndrome--irritability, confusion, fast or irregular heartbeat, muscle stiffness, twitching muscles, sweating, high fever, seizures, chills, vomiting, diarrhea °Sudden eye pain or change in vision such as blurry vision, seeing halos around lights, vision loss °Thoughts of suicide or self-harm, worsening mood, feelings of depression °Side effects that usually do not require medical attention (report to your care team if they continue or are bothersome): °Change in sex drive or performance °Diarrhea °Excessive sweating °Nausea °Tremors or shaking °Upset stomach °This list may not describe all possible side effects. Call your doctor for medical advice about side effects. You may report side effects to FDA at 1-800-FDA-1088. °Where should I keep my medication? °Keep out of the reach of children and pets. °Store at room temperature between 15 and 30 degrees C (59 and 86 degrees F). Keep container tightly closed. Throw away any unused medication after the expiration date. °NOTE: This sheet is a summary. It may not cover all possible information. If you have questions about this medicine, talk to your doctor, pharmacist, or health care provider. °© 2022 [employer]vier/Gold Standard (2020-08-07 00:00:00) ° °

## 2021-07-30 NOTE — Progress Notes (Signed)
Virtual Visit via Video Note  I connected with Therisa Doyne Pollinger on 07/30/21 at  9:45 AM EST by a video enabled telemedicine application and verified that I am speaking with the correct person using two identifiers.  Location: Patient: Home Provider: Office   I discussed the limitations of evaluation and management by telemedicine and the availability of in person appointments. The patient expressed understanding and agreed to proceed.  History of Present Illness: Terri [name] is a 25 y.o. [phone] female at 4 weeks postpartum s/p SVD who presents for mood check. Patient has a h/o anxiety, was discharged home from the hospital with prescription of Buspar. Notes that the first few days of taking it she felt dizzy, groggy, and just "out of it".  After several weeks she noticed that her panic attacks were getting worse.  She is no longer breastfeeding.  Reports bleeding has stopped.      Observations/Objective:  Height 5\' 3"  (1.6 m), weight 210 lb (95.3 kg), not currently breastfeeding.  Gen App: NAD Psych: normal affect, thought and speech.   GAD 7 : Generalized Anxiety Score 07/30/2021 03/02/2019 01/16/2019  Nervous, Anxious, on Edge 3 3 3   Control/stop worrying 2 2 3   Worry too much - different things 2 3 3   Trouble relaxing 1 1 3   Restless 1 3 2   Easily annoyed or irritable 3 3 3   Afraid - awful might happen 0 2 3  Total GAD 7 Score 12 17 20   Anxiety Difficulty Very difficult Very difficult Extremely difficult      Depression screen [facility] 2/9 07/30/2021 06/10/2021 10/30/2019 09/13/2019 03/02/2019  Decreased Interest 1 0 0 3 1  Down, Depressed, Hopeless 0 0 0 2 0  PHQ - 2 Score 1 0 0 5 1  Altered sleeping 3 - 0 3 1  Tired, decreased energy 3 - 0 3 1  Change in appetite 0 - 0 3 0  Feeling bad or failure about yourself  0 - 0 3 0  Trouble concentrating 3 - 0 3 1  Moving slowly or fidgety/restless 0 - 0 0 0  Suicidal thoughts 0 - 0 0 0  PHQ-9 Score 10 - 0 20 4  Difficult  doing work/chores Somewhat difficult - Not difficult at all Extremely dIfficult Not difficult at all    Assessment and Plan:  - Perinatal mood and anxiety disorder - Will start Paxil (patient notes history of trying several medications in the past which did not help).  Will also d/c Buspar and start Atarax (patient notes that she does not desire to utilize meds like Xanax at this time) for her anxiety.  - F/u in 2 weeks for routine postpartum visit in office. Will reassess symptoms   Follow Up Instructions:  RTC in 2 weeks.    I discussed the assessment and treatment plan with the patient. The patient was provided an opportunity to ask questions and all were answered. The patient agreed with the plan and demonstrated an understanding of the instructions.   The patient was advised to call back or seek an in-person evaluation if the symptoms worsen or if the condition fails to improve as anticipated.  I provided 13 minutes of non-face-to-face time during this encounter.   , MD [facility]

## 2021-08-05 ENCOUNTER — Other Ambulatory Visit: Payer: Self-pay

## 2021-08-05 ENCOUNTER — Ambulatory Visit (INDEPENDENT_AMBULATORY_CARE_PROVIDER_SITE_OTHER): Payer: Medicaid Other | Admitting: Obstetrics and Gynecology

## 2021-08-05 ENCOUNTER — Encounter: Payer: Self-pay | Admitting: Obstetrics and Gynecology

## 2021-08-05 DIAGNOSIS — O99345 Other mental disorders complicating the puerperium: Secondary | ICD-10-CM | POA: Diagnosis not present

## 2021-08-05 DIAGNOSIS — Z975 Presence of (intrauterine) contraceptive device: Secondary | ICD-10-CM

## 2021-08-05 DIAGNOSIS — F53 Postpartum depression: Secondary | ICD-10-CM

## 2021-08-05 DIAGNOSIS — I959 Hypotension, unspecified: Secondary | ICD-10-CM | POA: Diagnosis not present

## 2021-08-05 DIAGNOSIS — F418 Other specified anxiety disorders: Secondary | ICD-10-CM

## 2021-08-05 NOTE — Progress Notes (Signed)
OBSTETRICS POSTPARTUM CLINIC PROGRESS NOTE  Subjective:     Terri [name] is a 25 y.o. [phone] female who presents for a postpartum visit. She is 6 week postpartum following a spontaneous vaginal delivery. I have fully reviewed the prenatal and intrapartum course. The delivery was at 39 gestational weeks.  Anesthesia: epidural. Postpartum course has been well. Baby's course has been well. Baby is feeding by bottle - [employer] Gent[employer] Good Start . Bleeding: patient has resumed menses, with Patient's last menstrual period was 07/27/2021 (approximate). Bowel function is normal. Bladder function is normal. Patient is sexually active. Contraception method desired is IUD Rutha Bouchard placed immediately postpartum). Notes partner can feel the strings. Postpartum depression screening: positive (borderline).  EDPS score is 10.  Notes that she finally started adjusting to the Buspar (previously was noting side effects).      The following portions of the patient's history were reviewed and updated as appropriate: allergies, current medications, past family history, past medical history, past social history, past surgical history, and problem list.  Review of Systems Pertinent items noted in HPI and remainder of comprehensive ROS otherwise negative.   Objective:    BP (!) 81/72   Pulse 89   Ht 5\' 3"  (1.6 m)   Wt 224 lb 8 oz (101.8 kg)   LMP 07/27/2021 (Approximate)   Breastfeeding No   BMI 39.77 kg/m   General:  alert and no distress   Breasts:  inspection negative, no nipple discharge or bleeding, no masses or nodularity palpable  Lungs: clear to auscultation bilaterally  Heart:  regular rate and rhythm, S1, S2 normal, no murmur, click, rub or gallop  Abdomen: soft, non-tender; bowel sounds normal; no masses,  no organomegaly.    Vulva:  normal  Vagina: normal vagina, no discharge, exudate, lesion, or erythema  Cervix:  no cervical motion tenderness and no lesions  Corpus: normal size, contour,  position, consistency, mobility, non-tender  Adnexa:  normal adnexa and no mass, fullness, tenderness  Rectal Exam: Not performed.        GAD 7 : Generalized Anxiety Score 07/30/2021 03/02/2019 01/16/2019  Nervous, Anxious, on Edge 3 3 3   Control/stop worrying 2 2 3   Worry too much - different things 2 3 3   Trouble relaxing 1 1 3   Restless 1 3 2   Easily annoyed or irritable 3 3 3   Afraid - awful might happen 0 2 3  Total GAD 7 Score 12 17 20   Anxiety Difficulty Very difficult Very difficult Extremely difficult    Edinburgh Postnatal Depression Scale Screening Tool 08/05/2021 06/24/2021  I have been able to laugh and see the funny side of things. 0 0  I have looked forward with enjoyment to things. 0 1  I have blamed myself unnecessarily when things went wrong. 2 0  I have been anxious or worried for no good reason. 3 3  I have felt scared or panicky for no good reason. 2 3  Things have been getting on top of me. 1 1  I have been so unhappy that I have had difficulty sleeping. 0 0  I have felt sad or miserable. 1 1  I have been so unhappy that I have been crying. 1 0  The thought of harming myself has occurred to me. 0 0  Edinburgh Postnatal Depression Scale Total 10 9     Labs:  Lab Results  Component Value Date   HGB 10.3 (L) 06/25/2021     Assessment:   1.  Postpartum care following vaginal delivery   2. Postpartum anxiety   3. Postpartum depression   4. IUD (intrauterine device) in place   5. Hypotension, unspecified hypotension type      Plan:   1. Contraception: IUD Rutha Bouchard). Strings trimmed today. Can keep in place for 5 years.  2. Discussed management of depression and anxiety. Notes most bothersome symptom is her anxiety.  Is doing better now on Buspar. Did not start Paxil or Atarax as previously recommended. Can utilize if symptoms worsen. 3. Hypotension, advised on increasing hydration and sodium intake.   4. Follow up in:  4-6  months for annual exam, or  sooner as needed.   Hildred Laser, MD [facility]

## 2021-08-07 ENCOUNTER — Telehealth: Payer: Medicaid Other | Admitting: Physician Assistant

## 2021-08-07 DIAGNOSIS — N3000 Acute cystitis without hematuria: Secondary | ICD-10-CM

## 2021-08-07 MED ORDER — CEPHALEXIN 500 MG PO CAPS: 500.0000 mg | ORAL_CAPSULE | Freq: Two times a day (BID) | ORAL | 0 refills | Status: AC

## 2021-08-07 NOTE — Progress Notes (Signed)
Virtual Visit Consent   Terri [name], you are scheduled for a virtual visit with a [facility] provider today.     Just as with appointments in the office, your consent must be obtained to participate.  Your consent will be active for this visit and any virtual visit you may have with one of our providers in the next 365 days.     If you have a MyChart account, a copy of this consent can be sent to you electronically.  All virtual visits are billed to your insurance company just like a traditional visit in the office.    As this is a virtual visit, video technology does not allow for your provider to perform a traditional examination.  This may limit your provider's ability to fully assess your condition.  If your provider identifies any concerns that need to be evaluated in person or the need to arrange testing (such as labs, EKG, etc.), we will make arrangements to do so.     Although advances in technology are sophisticated, we cannot ensure that it will always work on either your end or our end.  If the connection with a video visit is poor, the visit may have to be switched to a telephone visit.  With either a video or telephone visit, we are not always able to ensure that we have a secure connection.     I need to obtain your verbal consent now.   Are you willing to proceed with your visit today?    Terri [name] has provided verbal consent on 08/07/2021 for a virtual visit (video or telephone).   Terri [name], [place]   Date: 08/07/2021 3:48 PM   Virtual Visit via Video Note   I, Terri [name], connected with  Terri [name]  ([id number], [birth year]) on 08/07/21 at  3:45 PM EST by a video-enabled telemedicine application and verified that I am speaking with the correct person using two identifiers.  Location: Patient: Virtual Visit Location Patient: Home Provider: Virtual Visit Location Provider: Home Office   I discussed the limitations of evaluation and  management by telemedicine and the availability of in person appointments. The patient expressed understanding and agreed to proceed.    History of Present Illness: Terri [name] is a 25 y.o. who identifies as a female who was assigned female at birth, and is being seen today for possible UTI. Symptoms started 2 days ago with urinary urgency, frequency, hesitancy and suprapubic pressure. Denies fever, chills, nausea or vomiting. Denies back pain.Denies vaginal pain or discharge. Is 6 weeks postpartum with checkup last week. Patient is not breastfeeding. Has history of UTI and this feels identical to her. Last UTI was about 2-3 months ago.  HPI: HPI  Problems:  Patient Active Problem List   Diagnosis Date Noted   Obesity during pregnancy, antepartum 06/24/2021   Insufficient prenatal care 06/24/2021   Late prenatal care affecting pregnancy in third trimester 06/24/2021   Marijuana use during pregnancy 06/24/2021   Left hand pain 10/30/2020   Arthralgia of both knees and bilateral hands.  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  Told allergic as child but believes she's taken since.   Zithromax [Azithromycin] Other (See Comments)    Reaction: unknown Told reaction as a child   Medications:  Current Outpatient Medications:    cephALEXin (KEFLEX) 500 MG capsule, Take 1 capsule (500 mg  total) by mouth 2 (two) times daily for 7 days., Disp: 14 capsule, Rfl: 0   busPIRone (BUSPAR) 5 MG tablet, Take 1 tablet (5 mg total) by mouth 3 (three) times daily., Disp: 90 tablet, Rfl: 1   hydrOXYzine (ATARAX) 50 MG tablet, Take 1 tablet (50 mg total) by mouth 3 (three) times daily as needed for anxiety. (Patient not taking: Reported on 08/05/2021), Disp: 30 tablet, Rfl: 1   levonorgestrel (KYLEENA) 19.5 MG IUD, 1 each by Intrauterine route once., Disp: , Rfl:   Observations/Objective: Patient is well-developed, well-nourished in no acute distress.  Resting comfortably at home.  Head is normocephalic, atraumatic.  No labored breathing. Speech is clear and coherent with logical content.  Patient is alert and oriented at baseline.   Assessment and Plan: 1. Acute cystitis without hematuria - cephALEXin (KEFLEX) 500 MG capsule; Take 1 capsule (500 mg total) by mouth 2 (two) times daily for 7 days.  Dispense: 14 capsule; Refill: 0 Milder symptoms. Classic symptoms with known prior history. Is not pregnant (delivered 6+ weeks ago) and not breastfeeding. In absence of alarm symptoms will treat for UTI via video. Rx Keflex 500 mg BID x 7 days. Supportive measures and OTC medications reviewed. Will need in-person evaluation for any non-resolving, new or worsening symptoms.  Follow Up Instructions: I discussed the assessment and treatment plan with the patient. The patient was provided an opportunity to ask questions and all were answered. The patient agreed with the plan and demonstrated an understanding of the instructions.  A copy of instructions were sent to the patient via MyChart unless otherwise noted below.   The patient was advised to call back or seek an in-person evaluation if the symptoms worsen or if the condition fails to improve as anticipated.  Time:  I spent 8 minutes with the patient via telehealth technology discussing the above problems/concerns.    Terri Rio,  PA-C

## 2021-08-07 NOTE — Patient Instructions (Addendum)
Terri [name], thank you for joining [name], PA-C for today's virtual visit.  While this provider is not your primary care provider (PCP), if your PCP is located in our provider database this encounter information will be shared with them immediately following your visit.  Consent: (Patient) Terri [name] provided verbal consent for this virtual visit at the beginning of the encounter.  Current Medications:  Current Outpatient Medications:    busPIRone (BUSPAR) 5 MG tablet, Take 1 tablet (5 mg total) by mouth 3 (three) times daily., Disp: 90 tablet, Rfl: 1   hydrOXYzine (ATARAX) 50 MG tablet, Take 1 tablet (50 mg total) by mouth 3 (three) times daily as needed for anxiety. (Patient not taking: Reported on 08/05/2021), Disp: 30 tablet, Rfl: 1   levonorgestrel (KYLEENA) 19.5 MG IUD, 1 each by Intrauterine route once., Disp: , Rfl:    PARoxetine (PAXIL) 20 MG tablet, Take 1 tablet (20 mg total) by mouth daily. Take 1/2 tablet daily for first week, then increase to 1 whole tablet daily. (Patient not taking: Reported on 08/05/2021), Disp: 90 tablet, Rfl: 0   Medications ordered in this encounter:  No orders of the defined types were placed in this encounter.    *If you need refills on other medications prior to your next appointment, please contact your pharmacy*  Follow-Up: Call back or seek an in-person evaluation if the symptoms worsen or if the condition fails to improve as anticipated.  Other Instructions Your symptoms are consistent with a bladder infection, also called acute cystitis. Please take your antibiotic (Keflex) as directed until all pills are gone.  Stay very well hydrated.  Consider a daily probiotic (Align, Culturelle, or Activia) to help prevent stomach upset caused by the antibiotic.  Taking a probiotic daily may also help prevent recurrent UTIs.  Also consider taking AZO (Phenazopyridine) tablets to help decrease pain with urination.    Urinary Tract  Infection A urinary tract infection (UTI) can occur any place along the urinary tract. The tract includes the kidneys, ureters, bladder, and urethra. A type of germ called bacteria often causes a UTI. UTIs are often helped with antibiotic medicine.  HOME CARE  If given, take antibiotics as told by your doctor. Finish them even if you start to feel better. Drink enough fluids to keep your pee (urine) clear or pale yellow. Avoid tea, drinks with caffeine, and bubbly (carbonated) drinks. Pee often. Avoid holding your pee in for a long time. Pee before and after having sex (intercourse). Wipe from front to back after you poop (bowel movement) if you are a woman. Use each tissue only once. GET HELP RIGHT AWAY IF:  You have back pain. You have lower belly (abdominal) pain. You have chills. You feel sick to your stomach (nauseous). You throw up (vomit). Your burning or discomfort with peeing does not go away. You have a fever. Your symptoms are not better in 3 days. MAKE SURE YOU:  Understand these instructions. Will watch your condition. Will get help right away if you are not doing well or get worse. Document Released: 02/03/2008 Document Revised: 05/11/2012 Document Reviewed: 03/17/2012 [facility] Patient Information 2015 [place], [place]. This information is not intended to replace advice given to you by your health care provider. Make sure you discuss any questions you have with your health care provider.    If you have been instructed to have an in-person evaluation today at a local Urgent Care facility, please use the link below. It will take  you to a list of all of our available [facility], including address, phone number and hours of operation. Please do not delay care.  [facility]  If you or a family member do not have a primary care provider, use the link below to schedule a visit and establish care. When you choose a [facility] primary care physician or  advanced practice provider, you gain a long-term partner in health. Find a Primary Care Provider  Learn more about [place]'s in-office and virtual care options: [facility] - Get Care Now

## 2021-09-17 ENCOUNTER — Other Ambulatory Visit: Payer: Self-pay | Admitting: Obstetrics and Gynecology

## 2021-10-21 ENCOUNTER — Ambulatory Visit (INDEPENDENT_AMBULATORY_CARE_PROVIDER_SITE_OTHER): Payer: Medicaid Other | Admitting: Physician Assistant

## 2021-10-21 ENCOUNTER — Other Ambulatory Visit: Payer: Self-pay

## 2021-10-21 VITALS — BP 137/88 | HR 89 | Temp 98.5°F | Wt 229.0 lb

## 2021-10-21 DIAGNOSIS — R519 Headache, unspecified: Secondary | ICD-10-CM | POA: Diagnosis not present

## 2021-10-21 DIAGNOSIS — F411 Generalized anxiety disorder: Secondary | ICD-10-CM

## 2021-10-21 DIAGNOSIS — F322 Major depressive disorder, single episode, severe without psychotic features: Secondary | ICD-10-CM | POA: Diagnosis not present

## 2021-10-21 MED ORDER — DULOXETINE HCL 30 MG PO CPEP: 30.0000 mg | ORAL_CAPSULE | Freq: Every day | ORAL | 3 refills | Status: DC

## 2021-10-21 NOTE — Assessment & Plan Note (Addendum)
Chronic, recurrent concern  Patient is currently taking Buspar and Hydroxyzine for anxiety and depression but PHQ9 is 25 today with passive SI - denies plan to hurt herself or other, denies active SI Discussed controller options at length - patient states she has tried Celexa, Prozac and Sertraline in the past but experience marked libido decrease which exacerbated her symptoms further  She is amenable to trying SNRI for management - will start with Duloxetine 30 mg PO QD with taper from Buspar during first few weeks Patient may stop Hydroxyzine at this time as she states it primarily makes her sleepy Urgent psych referral due to PHQ9 score and  for Passive SI Recommend follow up in 6 weeks to assess progress If this regimen fails recommend attempt be made to try Trintellix or will need to defer to psych recommendations

## 2021-10-21 NOTE — Assessment & Plan Note (Signed)
Chronic, historic condition Patient reports she was previously told she has pseudotumor cerebri which can cause some of the eye movement concerns she reported today.  Review of her previous medical history did not reveal confirmed diagnosis but she was followed by Neurology for these headaches.  Unable to assess if she has been to Ophthalmology at this time Discussed plan to monitor at this time but may need evaluation with Neuro and Ophthalmology if symptoms continue  Recommend follow check in at next apt for continued symptoms.

## 2021-10-21 NOTE — Assessment & Plan Note (Signed)
Chronic, ongoing concern with recent exacerbations States she is experiencing panic attacks daily Discussed that she will likely need to be on a daily controller - recommend SNRI given her history of SSRI-induced reduced libido Am providing script for Duloxetine 30mg  PO QD and recommend taper from Buspar during first few weeks Will discuss if there is need for breakthrough management with benzodiazapine at later visit as I suspect frequent panic attacks are more related to insufficiently controlled generalized anxiety rather than repeated discreet episodes  Discussed therapy today but patient is hesitant due to her full schedule and life demands.  Recommend follow up in 6 weeks to assess progress on Cymbalta and status with psychiatry referral.

## 2021-10-21 NOTE — Patient Instructions (Addendum)
I am starting you on Cymbalta 30 mg to be taken by mouth once per day After a week of the Cymbalta please reduce the Buspar to 2 pills per day. I would like you to slowly decrease your Buspar to 2 pills per day then one in the evening as your depression and anxiety symptoms allow.  You can take a Hydroxyzine as needed but if it is not helping you can stop taking altogether

## 2021-10-21 NOTE — Progress Notes (Signed)
Established patient visit   Patient: Terri [name]   DOB: 08-18-1996   25 y.o. Female  MRN: [id number] Visit Date: 10/21/2021  Today's healthcare provider: Dani Gobble Freddie Nghiem, PA-C  Introduced myself to the patient as a Journalist, newspaper and provided education on APPs in clinical practice.     No chief complaint on file.  Subjective    HPI  Patient is a 26 year old female who presents with some concerns of cognition.  She states for the last week or so she has been struggling to find words, she has been mixing words up and also she states she often does not understand what people are saying.  She said this is getting progressively worse.   She note that about 2 weeks ago she had trouble with her eye twitching.  Then she said her face cramped up and got "locked up" for about 30 minute.  She states during that time she was able to talk but it looked like one side of her face cramped up and the eye that was twitching was pulled partially closed.  She has not had any other episodes like this since. She did want to note that she is 4 months post partum.   She states she has been stressed with work lately Reports a few weeks ago her eye started twitching then her eye became fixed and facial muscles tensed up States that after that her husband noted she had difficulty speaking in well-worded sentences She states she has a history of migraines with aura   States her anxiety has been higher lately and she doesn't feel like the hydoxyzine is helping with it States she is having anxiety attacks frequently, almost every day, with associated back pain and palpitations.   States she has tried several SSRIs and these led to libido issues that had effects on her marriage and thus increased depression. Tried Prozac, Zoloft, Celexa,   States Zoloft made her sick and she vomited every day    She is not breast feeding at this time States she is not having issues with her child, loves him and is very happy  with him being in home.      Medications: Outpatient Medications Prior to Visit  Medication Sig   busPIRone (BUSPAR) 5 MG tablet TAKE 1 TABLET BY MOUTH THREE TIMES DAILY   hydrOXYzine (ATARAX) 50 MG tablet TAKE 1 TABLET BY MOUTH THREE TIMES DAILY AS NEEDED FOR ANXIETY   levonorgestrel (KYLEENA) 19.5 MG IUD 1 each by Intrauterine route once.   No facility-administered medications prior to visit.    Review of Systems  Eyes:  Negative for visual disturbance.  Neurological:  Positive for dizziness, facial asymmetry (not since the one epoisode), speech difficulty, light-headedness and headaches. Negative for tremors, seizures, syncope, weakness and numbness.  Psychiatric/Behavioral:  Positive for agitation, behavioral problems, confusion, decreased concentration, dysphoric mood, self-injury (rarely), sleep disturbance and suicidal ideas (rarely-more like "they would be better off without me"). Negative for hallucinations. The patient is nervous/anxious. The patient is not hyperactive.     Depression screen [facility] 2/9 10/21/2021 07/30/2021 06/10/2021 10/30/2019 09/13/2019  Decreased Interest 3 1 0 0 3  Down, Depressed, Hopeless 3 0 0 0 2  PHQ - 2 Score 6 1 0 0 5  Altered sleeping 3 3 - 0 3  Tired, decreased energy 3 3 - 0 3  Change in appetite 3 0 - 0 3  Feeling bad or failure about yourself  3 0 -  0 3  Trouble concentrating 3 3 - 0 3  Moving slowly or fidgety/restless 3 0 - 0 0  Suicidal thoughts 1 0 - 0 0  PHQ-9 Score 25 10 - 0 20  Difficult doing work/chores Extremely dIfficult Somewhat difficult - Not difficult at all Extremely dIfficult   GAD 7 : Generalized Anxiety Score 08/05/2021 07/30/2021 03/02/2019 01/16/2019  Nervous, Anxious, on Edge 2 3 3 3   Control/stop worrying 1 2 2 3   Worry too much - different things 1 2 3 3   Trouble relaxing 2 1 1 3   Restless 0 1 3 2   Easily annoyed or irritable 3 3 3 3   Afraid - awful might happen 0 0 2 3  Total GAD 7 Score 9 12 17 20   Anxiety  Difficulty Somewhat difficult Very difficult Very difficult Extremely difficult         Objective    BP 137/88 (BP Location: Right Arm, Patient Position: Sitting, Cuff Size: Large)    Pulse 89    Temp 98.5 F (36.9 C) (Oral)    Wt 229 lb (103.9 kg)    SpO2 99%    BMI 40.57 kg/m   Vitals:   10/21/21 1043 10/21/21 1044  BP: (!) 134/95 137/88  Pulse: 89   Temp: 98.5 F (36.9 C)   TempSrc: Oral   SpO2: 99%   Weight: 229 lb (103.9 kg)      Physical Exam Vitals reviewed.  Constitutional:      Appearance: Normal appearance. She is obese.  HENT:     Head: Normocephalic and atraumatic.  Eyes:     General: Lids are normal.     Extraocular Movements: Extraocular movements intact.     Right eye: No nystagmus.     Left eye: No nystagmus.     Conjunctiva/sclera: Conjunctivae normal.     Pupils: Pupils are equal, round, and reactive to light.  Cardiovascular:     Rate and Rhythm: Normal rate and regular rhythm.     Pulses: Normal pulses.     Heart sounds: Normal heart sounds.  Pulmonary:     Effort: Pulmonary effort is normal.     Breath sounds: Normal breath sounds and air entry.  Musculoskeletal:     Right lower leg: No edema.     Left lower leg: No edema.  Neurological:     General: No focal deficit present.     Mental Status: She is alert and oriented to person, place, and time.     GCS: GCS eye subscore is 4. GCS verbal subscore is 5. GCS motor subscore is 6.     Cranial Nerves: Cranial nerves 2-12 are intact.     Sensory: Sensation is intact.     Motor: Motor function is intact. No weakness or tremor.     Coordination: Finger-Nose-Finger Test and Heel to [employer] normal.  Psychiatric:        Attention and Perception: Attention and perception normal.        Mood and Affect: Mood is anxious.        Speech: Speech normal.        Behavior: Behavior normal. Behavior is cooperative.        Thought Content: Thought content normal.        Cognition and Memory: Cognition  normal.        Judgment: Judgment normal.      No results found for any visits on 10/21/21.  Assessment & Plan  Problem List Items Addressed This Visit       Other   GAD (generalized anxiety disorder) - Primary    Chronic, ongoing concern with recent exacerbations States she is experiencing panic attacks daily Discussed that she will likely need to be on a daily controller - recommend SNRI given her history of SSRI-induced reduced libido Am providing script for Duloxetine 30mg  PO QD and recommend taper from Buspar during first few weeks Will discuss if there is need for breakthrough management with benzodiazapine at later visit as I suspect frequent panic attacks are more related to insufficiently controlled generalized anxiety rather than repeated discreet episodes  Discussed therapy today but patient is hesitant due to her full schedule and life demands.  Recommend follow up in 6 weeks to assess progress on Cymbalta and status with psychiatry referral.       Relevant Medications   DULoxetine (CYMBALTA) 30 MG capsule   Other Relevant Orders   Ambulatory referral to Psychiatry   Depression, major, single episode, severe ([place])    Chronic, recurrent concern  Patient is currently taking Buspar and Hydroxyzine for anxiety and depression but PHQ9 is 25 today with passive SI - denies plan to hurt herself or other, denies active SI Discussed controller options at length - patient states she has tried Celexa, Prozac and Sertraline in the past but experience marked libido decrease which exacerbated her symptoms further  She is amenable to trying SNRI for management - will start with Duloxetine 30 mg PO QD with taper from Buspar during first few weeks Patient may stop Hydroxyzine at this time as she states it primarily makes her sleepy Urgent psych referral due to PHQ9 score and  for Passive SI Recommend follow up in 6 weeks to assess progress If this regimen fails recommend attempt be  made to try Trintellix or will need to defer to psych recommendations       Relevant Medications   DULoxetine (CYMBALTA) 30 MG capsule   Other Relevant Orders   Ambulatory referral to Psychiatry   Headache disorder    Chronic, historic condition Patient reports she was previously told she has pseudotumor cerebri which can cause some of the eye movement concerns she reported today.  Review of her previous medical history did not reveal confirmed diagnosis but she was followed by Neurology for these headaches.  Unable to assess if she has been to Ophthalmology at this time Discussed plan to monitor at this time but may need evaluation with Neuro and Ophthalmology if symptoms continue  Recommend follow check in at next apt for continued symptoms.       Relevant Medications   DULoxetine (CYMBALTA) 30 MG capsule     Return in about 6 weeks (around 12/02/2021) for Anxiety and depression .   I, Esmee Fallaw E Guerry Covington, PA-C, have reviewed all documentation for this visit. The documentation on 10/21/21 for the exam, diagnosis, procedures, and orders are all accurate and complete.   Nechemia Chiappetta, Glennie Isle MPH [place], PA-C  [facility] [phone] (phone) [phone] (fax)  [place]

## 2021-11-09 ENCOUNTER — Telehealth: Payer: Medicaid Other | Admitting: Physician Assistant

## 2021-11-09 DIAGNOSIS — J4520 Mild intermittent asthma, uncomplicated: Secondary | ICD-10-CM | POA: Diagnosis not present

## 2021-11-09 MED ORDER — ALBUTEROL SULFATE HFA 108 (90 BASE) MCG/ACT IN AERS: 1.0000 | INHALATION_SPRAY | Freq: Four times a day (QID) | RESPIRATORY_TRACT | 0 refills | Status: DC | PRN

## 2021-11-09 NOTE — Progress Notes (Signed)
?Virtual Visit Consent  ? ?Terri [name], you are scheduled for a virtual visit with a [facility] provider today.   ?  ?Just as with appointments in the office, your consent must be obtained to participate.  Your consent will be active for this visit and any virtual visit you may have with one of our providers in the next 365 days.   ?  ?If you have a MyChart account, a copy of this consent can be sent to you electronically.  All virtual visits are billed to your insurance company just like a traditional visit in the office.   ? ?As this is a virtual visit, video technology does not allow for your provider to perform a traditional examination.  This may limit your provider's ability to fully assess your condition.  If your provider identifies any concerns that need to be evaluated in person or the need to arrange testing (such as labs, EKG, etc.), we will make arrangements to do so.   ?  ?Although advances in technology are sophisticated, we cannot ensure that it will always work on either your end or our end.  If the connection with a video visit is poor, the visit may have to be switched to a telephone visit.  With either a video or telephone visit, we are not always able to ensure that we have a secure connection.    ? ?I need to obtain your verbal consent now.   Are you willing to proceed with your visit today?  ?  ?Terri [name] has provided verbal consent on 11/09/2021 for a virtual visit (video or telephone). ?  ?Jarold Motto, PA  ? ?Date: 11/09/2021 12:16 PM ? ? ?Virtual Visit via Video Note  ? ?IJarold Motto, connected with  Terri [name]  ([id number], 05/19/1996) on 11/09/21 at 12:30 PM EDT by a video-enabled telemedicine application and verified that I am speaking with the correct person using two identifiers. ? ?Location: ?Patient: Virtual Visit Location Patient: Home ?Provider: Virtual Visit Location Provider: Home Office ?  ?I discussed the limitations of evaluation and  management by telemedicine and the availability of in person appointments. The patient expressed understanding and agreed to proceed.   ? ?History of Present Illness: ?Terri [name] is a 26 y.o. who identifies as a female who was assigned female at birth, and is being seen today for intermittent SOB associated with allergies. ? ?Patient had COVID-19 in Sep 2022. She was given an albuterol inhaler to use prn during this time. No prior hx of asthma. She used this inhaler without any difficulty. ? ?She has noticed that as the seasons have changed, she has had a little bit of wheezing in the mornings. She has recently ran out of this inhaler and would like a new one. Uses daily antihistamine. She is a current smoker. ? ?Denies: hx of asthma, concern for pregnancy, sudden SOB, chest pain, lightheadedness, swelling in legs, dizziness ? ?HPI: HPI  ?Problems:  ?Patient Active Problem List  ? Diagnosis Date Noted  ? Obesity during pregnancy, antepartum 06/24/2021  ? Insufficient prenatal care 06/24/2021  ? Late prenatal care affecting pregnancy in third trimester 06/24/2021  ? Marijuana use during pregnancy 06/24/2021  ? Left hand pain 10/30/2020  ? Arthralgia of both knees and bilateral hands.  10/30/2020  ? Family history of rheumatoid arthritis 10/30/2020  ? Headache disorder 10/30/2019  ? Blurred vision 10/16/2019  ? Eye pressure 10/16/2019  ? Supervision of other high risk pregnancy, antepartum  10/05/2019  ? Polyarthralgia 09/13/2019  ? Joint stiffness 09/13/2019  ? GAD (generalized anxiety disorder) 01/16/2019  ? Depression, major, single episode, severe ([facility]) 01/16/2019  ? PTSD (post-traumatic stress disorder) 01/16/2019  ? Tobacco use disorder 01/16/2019  ? 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  ?Allergies:  ?Allergies  ?Allergen Reactions  ? Sulfa Antibiotics Other (See Comments)  ?  Reaction: unknown.  ?Told allergic as child but believes she's taken since.  ? Zithromax [Azithromycin] Other (See Comments)  ?   Reaction: unknown Told reaction as a child  ? ?Medications:  ?Current Outpatient Medications:  ?  albuterol (VENTOLIN HFA) 108 (90 Base) MCG/ACT inhaler, Inhale 1-2 puffs into the lungs every 6 (six) hours as needed for wheezing or shortness of breath., Disp: 8 g, Rfl: 0 ?  busPIRone (BUSPAR) 5 MG tablet, TAKE 1 TABLET BY MOUTH THREE TIMES DAILY, Disp: 90 tablet, Rfl: 6 ?  DULoxetine (CYMBALTA) 30 MG capsule, Take 1 capsule (30 mg total) by mouth daily., Disp: 30 capsule, Rfl: 3 ?  hydrOXYzine (ATARAX) 50 MG tablet, TAKE 1 TABLET BY MOUTH THREE TIMES DAILY AS NEEDED FOR ANXIETY, Disp: 30 tablet, Rfl: 3 ?  levonorgestrel (KYLEENA) 19.5 MG IUD, 1 each by Intrauterine route once., Disp: , Rfl:  ? ?Observations/Objective: ?Patient is well-developed, well-nourished in no acute distress.  ?Resting comfortably  at home.  ?Head is normocephalic, atraumatic.  ?No labored breathing.  ?Speech is clear and coherent with logical content.  ?Patient is alert and oriented at baseline.  ? ? ?Assessment and Plan: ?1. Mild intermittent reactive airway disease without complication ?Overall controlled; no red flags ?Needs albuterol refill -- I have sent this in to her local pharmacy ?If requiring use more than a few days a week, recommend close follow-up with PCP for further evaluation and management ?If new/worsening symptoms, needs close follow-up with PCP ? ?Follow Up Instructions: ?I discussed the assessment and treatment plan with the patient. The patient was provided an opportunity to ask questions and all were answered. The patient agreed with the plan and demonstrated an understanding of the instructions.  A copy of instructions were sent to the patient via MyChart unless otherwise noted below.  ? ? ?The patient was advised to call back or seek an in-person evaluation if the symptoms worsen or if the condition fails to improve as anticipated. ? ?Time:  ?I spent 5-10 minutes with the patient via telehealth technology discussing  the above problems/concerns.   ? ?Jarold Motto, PA  ?

## 2021-12-08 ENCOUNTER — Ambulatory Visit: Payer: Medicaid Other | Admitting: Family Medicine

## 2022-01-30 ENCOUNTER — Other Ambulatory Visit: Payer: Self-pay | Admitting: Physician Assistant

## 2022-01-30 MED ORDER — ALBUTEROL SULFATE HFA 108 (90 BASE) MCG/ACT IN AERS: 1.0000 | INHALATION_SPRAY | Freq: Four times a day (QID) | RESPIRATORY_TRACT | 0 refills | Status: DC | PRN
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
## 2022-02-02 NOTE — Patient Instructions (Incomplete)
Breast Self-Awareness Breast self-awareness is knowing how your breasts look and feel. You need to: Check your breasts on a regular basis. Tell your doctor about any changes. Become familiar with the look and feel of your breasts. This can help you catch a breast problem while it is still small and can be treated. You should do breast self-exams even if you have breast implants. What you need: A mirror. A well-lit room. A pillow or other soft object. How to do a breast self-exam Follow these steps to do a breast self-exam: Look for changes  Take off all the clothes above your waist. Stand in front of a mirror in a room with good lighting. Put your hands down at your sides. Compare your breasts in the mirror. Look for any difference between them, such as: A difference in shape. A difference in size. Wrinkles, dips, and bumps in one breast and not the other. Look at each breast for changes in the skin, such as: Redness. Scaly areas. Skin that has gotten thicker. Dimpling. Open sores (ulcers). Look for changes in your nipples, such as: Fluid coming out of a nipple. Fluid around a nipple. Bleeding. Dimpling. Redness. A nipple that looks pushed in (retracted), or that has changed position. Feel for changes Lie on your back. Feel each breast. To do this: Pick a breast to feel. Place a pillow under the shoulder closest to that breast. Put the arm closest to that breast behind your head. Feel the nipple area of that breast using the hand of your other arm. Feel the area with the pads of your three middle fingers by making small circles with your fingers. Use light, medium, and firm pressure. Continue the overlapping circles, moving downward over the breast. Keep making circles with your fingers. Stop when you feel your ribs. Start making circles with your fingers again, this time going upward until you reach your collarbone. Then, make circles outward across your breast and into your  armpit area. Squeeze your nipple. Check for discharge and lumps. Repeat these steps to check your other breast. Sit or stand in the tub or shower. With soapy water on your skin, feel each breast the same way you did when you were lying down. Write down what you find Writing down what you find can help you remember what to tell your doctor. Write down: What is normal for each breast. Any changes you find in each breast. These include: The kind of changes you find. A tender or painful breast. Any lump you find. Write down its size and where it is. When you last had your monthly period (menstrual cycle). General tips If you are breastfeeding, the best time to check your breasts is after you feed your baby or after you use a breast pump. If you get monthly bleeding, the best time to check your breasts is 5-7 days after your monthly cycle ends. With time, you will become comfortable with the self-exam. You will also start to know if there are changes in your breasts. Contact a doctor if: You see a change in the shape or size of your breasts or nipples. You see a change in the skin of your breast or nipples, such as red or scaly skin. You have fluid coming from your nipples that is not normal. You find a new lump or thick area. You have breast pain. You have any concerns about your breast health. Summary Breast self-awareness includes looking for changes in your breasts and feeling for changes   within your breasts. You should do breast self-awareness in front of a mirror in a well-lit room. If you get monthly periods (menstrual cycles), the best time to check your breasts is 5-7 days after your period ends. Tell your doctor about any changes you see in your breasts. Changes include changes in size, changes on the skin, painful or tender breasts, or fluid from your nipples that is not normal. This information is not intended to replace advice given to you by your health care provider. Make sure  you discuss any questions you have with your health care provider. Document Revised: 06/19/2021 Document Reviewed: 06/19/2021 Elsevier Patient Education  2023 Elsevier Inc. Preventive Care 21-39 Years Old, Female Preventive care refers to lifestyle choices and visits with your health care provider that can promote health and wellness. Preventive care visits are also called wellness exams. What can I expect for my preventive care visit? Counseling During your preventive care visit, your health care provider may ask about your: Medical history, including: Past medical problems. Family medical history. Pregnancy history. Current health, including: Menstrual cycle. Method of birth control. Emotional well-being. Home life and relationship well-being. Sexual activity and sexual health. Lifestyle, including: Alcohol, nicotine or tobacco, and drug use. Access to firearms. Diet, exercise, and sleep habits. Work and work environment. Sunscreen use. Safety issues such as seatbelt and bike helmet use. Physical exam Your health care provider may check your: Height and weight. These may be used to calculate your BMI (body mass index). BMI is a measurement that tells if you are at a healthy weight. Waist circumference. This measures the distance around your waistline. This measurement also tells if you are at a healthy weight and may help predict your risk of certain diseases, such as type 2 diabetes and high blood pressure. Heart rate and blood pressure. Body temperature. Skin for abnormal spots. What immunizations do I need?  Vaccines are usually given at various ages, according to a schedule. Your health care provider will recommend vaccines for you based on your age, medical history, and lifestyle or other factors, such as travel or where you work. What tests do I need? Screening Your health care provider may recommend screening tests for certain conditions. This may include: Pelvic exam  and Pap test. Lipid and cholesterol levels. Diabetes screening. This is done by checking your blood sugar (glucose) after you have not eaten for a while (fasting). Hepatitis B test. Hepatitis C test. HIV (human immunodeficiency virus) test. STI (sexually transmitted infection) testing, if you are at risk. BRCA-related cancer screening. This may be done if you have a family history of breast, ovarian, tubal, or peritoneal cancers. Talk with your health care provider about your test results, treatment options, and if necessary, the need for more tests. Follow these instructions at home: Eating and drinking  Eat a healthy diet that includes fresh fruits and vegetables, whole grains, lean protein, and low-fat dairy products. Take vitamin and mineral supplements as recommended by your health care provider. Do not drink alcohol if: Your health care provider tells you not to drink. You are pregnant, may be pregnant, or are planning to become pregnant. If you drink alcohol: Limit how much you have to 0-1 drink a day. Know how much alcohol is in your drink. In the [place] one drink equals one 12 oz bottle of beer (355 mL), one 5 oz glass of wine (148 mL), or one 1 oz glass of hard liquor (44 mL). Lifestyle Brush your teeth every morning and   night with fluoride toothpaste. Floss one time each day. Exercise for at least 30 minutes 5 or more days each week. Do not use any products that contain nicotine or tobacco. These products include cigarettes, chewing tobacco, and vaping devices, such as e-cigarettes. If you need help quitting, ask your health care provider. Do not use drugs. If you are sexually active, practice safe sex. Use a condom or other form of protection to prevent STIs. If you do not wish to become pregnant, use a form of birth control. If you plan to become pregnant, see your health care provider for a prepregnancy visit. Find healthy ways to manage stress, such as: Meditation, yoga, or  listening to music. Journaling. Talking to a trusted person. Spending time with friends and family. Minimize exposure to UV radiation to reduce your risk of skin cancer. Safety Always wear your seat belt while driving or riding in a vehicle. Do not drive: If you have been drinking alcohol. Do not ride with someone who has been drinking. If you have been using any mind-altering substances or drugs. While texting. When you are tired or distracted. Wear a helmet and other protective equipment during sports activities. If you have firearms in your house, make sure you follow all gun safety procedures. Seek help if you have been physically or sexually abused. What's next? Go to your health care provider once a year for an annual wellness visit. Ask your health care provider how often you should have your eyes and teeth checked. Stay up to date on all vaccines. This information is not intended to replace advice given to you by your health care provider. Make sure you discuss any questions you have with your health care provider. Document Revised: 02/12/2021 Document Reviewed: 02/12/2021 Elsevier Patient Education  2023 Elsevier Inc.  

## 2022-02-04 ENCOUNTER — Encounter: Payer: Medicaid Other | Admitting: Obstetrics and Gynecology

## 2022-03-17 NOTE — Progress Notes (Unsigned)
MyChart Video Visit    Virtual Visit via Video Note   This visit type was conducted due to national recommendations for restrictions regarding the COVID-19 Pandemic (e.g. social distancing) in an effort to limit this patient's exposure and mitigate transmission in our community. This patient is at least at moderate risk for complications without adequate follow up. This format is felt to be most appropriate for this patient at this time. Physical exam was limited by quality of the video and audio technology used for the visit.   Patient location: *** Provider location: [facility]  I discussed the limitations of evaluation and management by telemedicine and the availability of in person appointments. The patient expressed understanding and agreed to proceed.  Patient: Terri [name]   DOB: 1995-11-17   25 y.o. Female  MRN: [id number] Visit Date: 03/18/2022  Today's healthcare provider: Alfredia Ferguson, PA-C   No chief complaint on file.  Subjective    HPI  Depression, Follow-up  She  was last seen for this 5 months ago. Changes made at last visit include start with Duloxetine 30 mg PO QD with taper from Buspar during first few weeks Patient may stop Hydroxyzine at this time as she states it primarily makes her sleepy Urgent psych referral due to PHQ9 score and  for Passive SI Recommend follow up in 6 weeks to assess progress If this regimen fails recommend attempt be made to try Trintellix or will need to defer to psych recommendations.   She reports {excellent/good/fair/poor:19665} compliance with treatment. She {is/is not:21021397} having side effects. ***  She reports {DESC; GOOD/FAIR/POOR:18685} tolerance of treatment. Current symptoms include: {Symptoms; depression:1002} She feels she is {improved/worse/unchanged:3041574} since last visit.     10/21/2021   10:42 AM 07/30/2021   10:11 AM 06/10/2021    8:47 AM  Depression screen PHQ 2/9  Decreased  Interest 3 1 0  Down, Depressed, Hopeless 3 0 0  PHQ - 2 Score 6 1 0  Altered sleeping 3 3   Tired, decreased energy 3 3   Change in appetite 3 0   Feeling bad or failure about yourself  3 0   Trouble concentrating 3 3   Moving slowly or fidgety/restless 3 0   Suicidal thoughts 1 0   PHQ-9 Score 25 10   Difficult doing work/chores Extremely dIfficult Somewhat difficult     -----------------------------------------------------------------------------------------    Medications: Outpatient Medications Prior to Visit  Medication Sig   albuterol (VENTOLIN HFA) 108 (90 Base) MCG/ACT inhaler Inhale 1-2 puffs into the lungs every 6 (six) hours as needed for wheezing or shortness of breath.   busPIRone (BUSPAR) 5 MG tablet TAKE 1 TABLET BY MOUTH THREE TIMES DAILY   DULoxetine (CYMBALTA) 30 MG capsule Take 1 capsule (30 mg total) by mouth daily.   hydrOXYzine (ATARAX) 50 MG tablet TAKE 1 TABLET BY MOUTH THREE TIMES DAILY AS NEEDED FOR ANXIETY   levonorgestrel (KYLEENA) 19.5 MG IUD 1 each by Intrauterine route once.   No facility-administered medications prior to visit.    Review of Systems  {Labs  Heme  Chem  Endocrine  Serology  Results Review (optional):23779}   Objective    There were no vitals taken for this visit.  {Show previous vital signs (optional):23777}   Physical Exam     Assessment & Plan     ***  No follow-ups on file.     I discussed the assessment and treatment plan with the patient. The patient was provided  an opportunity to ask questions and all were answered. The patient agreed with the plan and demonstrated an understanding of the instructions.   The patient was advised to call back or seek an in-person evaluation if the symptoms worsen or if the condition fails to improve as anticipated.  I provided *** minutes of non-face-to-face time during this encounter.  {provider attestation***:1}  Alfredia Ferguson, PA-C [facility] [phone] (phone) [phone] (fax)  [facility]

## 2022-03-18 ENCOUNTER — Encounter: Payer: Self-pay | Admitting: Physician Assistant

## 2022-03-18 ENCOUNTER — Telehealth (INDEPENDENT_AMBULATORY_CARE_PROVIDER_SITE_OTHER): Payer: Medicaid Other | Admitting: Physician Assistant

## 2022-03-18 DIAGNOSIS — F322 Major depressive disorder, single episode, severe without psychotic features: Secondary | ICD-10-CM

## 2022-03-18 DIAGNOSIS — F411 Generalized anxiety disorder: Secondary | ICD-10-CM | POA: Diagnosis not present

## 2022-03-18 MED ORDER — BUPROPION HCL ER (XL) 150 MG PO TB24: 150.0000 mg | ORAL_TABLET | Freq: Every day | ORAL | 1 refills | Status: DC

## 2022-03-18 NOTE — Assessment & Plan Note (Signed)
Continue hydroxyzine prn. See depression note for psychiatry recommendations. Gave resources for emergent mental health services.

## 2022-03-18 NOTE — Patient Instructions (Signed)
Emergency Mental Health Services:  [facility] (like an urgent care for mental health) [address], [place], [place] [address] [phone]  [facility] Several different mental health services, has a crisis center  Terri [name], Terri [name] [address] [place], [place] [address]  [phone] Same-Day Access Hours:  Monday, Wednesday and Friday, 8am - 3pm Crisis & Diversion Center: Walk-In Crisis Hours: 7 days/week, 8am - 12am -  [facility] [employer] -  Patent examiner Drop-Off (side door)  [email or web address]  Text or call [phone] for emergent mental health situations

## 2022-03-18 NOTE — Assessment & Plan Note (Addendum)
Stressed the importance of developing a relationship with a psychiatrist. Again referred. Really encouraged she utilize this resource, as at this point she has trial and errored many medications. Discussed possibility of a condition other than depression -- due to pattern of feeling well/unwell.  Discussed trial of wellbutrin; would start at 150 mg xr after 1 week can increase to 300 mg daily. Discussed SE, warned of ETOH use. F/u 6 weeks.

## 2022-05-05 ENCOUNTER — Telehealth (INDEPENDENT_AMBULATORY_CARE_PROVIDER_SITE_OTHER): Payer: Medicaid Other | Admitting: Physician Assistant

## 2022-05-05 ENCOUNTER — Encounter: Payer: Self-pay | Admitting: Physician Assistant

## 2022-05-05 ENCOUNTER — Telehealth: Payer: Self-pay | Admitting: Family Medicine

## 2022-05-05 DIAGNOSIS — F322 Major depressive disorder, single episode, severe without psychotic features: Secondary | ICD-10-CM

## 2022-05-05 DIAGNOSIS — F411 Generalized anxiety disorder: Secondary | ICD-10-CM

## 2022-05-05 NOTE — Assessment & Plan Note (Signed)
Can d/c hydroxyzine or use sparingly if increasing restlessness

## 2022-05-05 NOTE — Assessment & Plan Note (Signed)
Advised pt she can try to go back to the 150 mg of wellbutrin to see if this helps her depression while also decreasing the insomnia/anxiety feeling.  Again stressed the importance of psychiatry to determine the presence of other mental health disorders, suspicion for bipolar or other mood disorder

## 2022-05-05 NOTE — Progress Notes (Unsigned)
I,Sha'taria Tyson,acting as a Neurosurgeon for [employer], PA-C.,have documented all relevant documentation on the behalf of Alfredia Ferguson, PA-C,as directed by  Alfredia Ferguson, PA-C while in the presence of Alfredia Ferguson, PA-C.   Established patient visit   Patient: Terri [name]   DOB: [birth year]   26 y.o. Female  MRN: [id number] Visit Date: 05/05/2022  Today's healthcare provider: Alfredia Ferguson, PA-C   No chief complaint on file.  Subjective    HPI  Depression, Follow-up  She  was last seen for this 7 weeks ago. Changes made at last visit include discussed possibility of a condition other than depression -- due to pattern of feeling well/unwell.  Discussed trial of wellbutrin; would start at 150 mg xr after 1 week can increase to 300 mg daily. Pt feels depression is quite improved on 300 mg of wellbutrin. She feels her anxiety has worsened. Denies improvement with hydroxyzine anymore. Feels she doesn't sleep as well.       03/18/2022   11:13 AM 10/21/2021   10:42 AM 07/30/2021   10:11 AM  Depression screen PHQ 2/9  Decreased Interest 1 3 1   Down, Depressed, Hopeless 1 3 0  PHQ - 2 Score 2 6 1   Altered sleeping 3 3 3   Tired, decreased energy 0 3 3  Change in appetite 0 3 0  Feeling bad or failure about yourself  2 3 0  Trouble concentrating 3 3 3   Moving slowly or fidgety/restless 0 3 0  Suicidal thoughts 0 1 0  PHQ-9 Score 10 25 10   Difficult doing work/chores Not difficult at all Extremely dIfficult Somewhat difficult    -----------------------------------------------------------------------------------------   Medications: Outpatient Medications Prior to Visit  Medication Sig   albuterol (VENTOLIN HFA) 108 (90 Base) MCG/ACT inhaler Inhale 1-2 puffs into the lungs every 6 (six) hours as needed for wheezing or shortness of breath.   buPROPion (WELLBUTRIN XL) 150 MG 24 hr tablet Take 1 tablet (150 mg total) by mouth daily. If feeling well after one week can  double and take 300 mg.   hydrOXYzine (ATARAX) 50 MG tablet TAKE 1 TABLET BY MOUTH THREE TIMES DAILY AS NEEDED FOR ANXIETY   levonorgestrel (KYLEENA) 19.5 MG IUD 1 each by Intrauterine route once.   No facility-administered medications prior to visit.    Review of Systems  Constitutional:  Negative for fatigue and fever.  Respiratory:  Negative for cough and shortness of breath.   Cardiovascular:  Negative for chest pain and leg swelling.  Gastrointestinal:  Negative for abdominal pain.  Neurological:  Negative for dizziness and headaches.  Psychiatric/Behavioral:  Positive for agitation. The patient is nervous/anxious.       Objective    not currently breastfeeding.   Physical Exam Constitutional:      Appearance: She is not ill-appearing.  Neurological:     Mental Status: She is oriented to person, place, and time.      No results found for any visits on 05/05/22.  Assessment & Plan     Problem List Items Addressed This Visit       Other   GAD (generalized anxiety disorder)    Can d/c hydroxyzine or use sparingly if increasing restlessness      Relevant Orders   Ambulatory referral to Psychiatry   Depression, major, single episode, severe (HCC) - Primary    Advised pt she can try to go back to the 150 mg of wellbutrin to see if this helps her depression  while also decreasing the insomnia/anxiety feeling.  Again stressed the importance of psychiatry to determine the presence of other mental health disorders, suspicion for bipolar or other mood disorder       Relevant Orders   Ambulatory referral to Psychiatry    Return in about 3 months (around 08/04/2022) for CPE.      I, Alfredia Ferguson, PA-C have reviewed all documentation for this visit. The documentation on  05/05/2022  for the exam, diagnosis, procedures, and orders are all accurate and complete.  Alfredia Ferguson, PA-C [facility] [address] [place], [place], [address] Office:  [phone] Fax: [phone]   [facility]

## 2022-05-05 NOTE — Telephone Encounter (Signed)
Patient called, she states that she received a vm to call the office. After reviewing pt chart, no message was found to relay to patient. Please call patient back.

## 2022-05-06 ENCOUNTER — Encounter: Payer: Self-pay | Admitting: Physician Assistant

## 2022-06-27 ENCOUNTER — Telehealth: Payer: Medicaid Other | Admitting: Family

## 2022-06-27 ENCOUNTER — Other Ambulatory Visit: Payer: Self-pay | Admitting: Physician Assistant

## 2022-06-27 DIAGNOSIS — R399 Unspecified symptoms and signs involving the genitourinary system: Secondary | ICD-10-CM

## 2022-06-27 DIAGNOSIS — F322 Major depressive disorder, single episode, severe without psychotic features: Secondary | ICD-10-CM

## 2022-06-27 MED ORDER — CEPHALEXIN 500 MG PO CAPS: 500.0000 mg | ORAL_CAPSULE | Freq: Two times a day (BID) | ORAL | 0 refills | Status: DC

## 2022-06-27 NOTE — Progress Notes (Signed)
Virtual Visit Consent   Therisa Doyne Gift, you are scheduled for a virtual visit with a [place] provider today. Just as with appointments in the office, your consent must be obtained to participate. Your consent will be active for this visit and any virtual visit you may have with one of our providers in the next 365 days. If you have a MyChart account, a copy of this consent can be sent to you electronically.  As this is a virtual visit, video technology does not allow for your provider to perform a traditional examination. This may limit your provider's ability to fully assess your condition. If your provider identifies any concerns that need to be evaluated in person or the need to arrange testing (such as labs, EKG, etc.), we will make arrangements to do so. Although advances in technology are sophisticated, we cannot ensure that it will always work on either your end or our end. If the connection with a video visit is poor, the visit may have to be switched to a telephone visit. With either a video or telephone visit, we are not always able to ensure that we have a secure connection.  By engaging in this virtual visit, you consent to the provision of healthcare and authorize for your insurance to be billed (if applicable) for the services provided during this visit. Depending on your insurance coverage, you may receive a charge related to this service.  I need to obtain your verbal consent now. Are you willing to proceed with your visit today? Terri [name] has provided verbal consent on 06/27/2022 for a virtual visit (video or telephone). Jannifer Rodney, FNP  Date: 06/27/2022 1:32 PM  Virtual Visit via Video Note   I, Jannifer Rodney, connected with  Terri [name]  ([id number], 1995-11-09) on 06/27/22 at  1:30 PM EDT by a video-enabled telemedicine application and verified that I am speaking with the correct person using two identifiers.  Location: Patient: Virtual Visit  Location Patient: Home Provider: Virtual Visit Location Provider: Home Office   I discussed the limitations of evaluation and management by telemedicine and the availability of in person appointments. The patient expressed understanding and agreed to proceed.    History of Present Illness: Terri [name] is a 26 y.o. who identifies as a female who was assigned female at birth, and is being seen today for UTI symptoms.  HPI: Dysuria  This is a new problem. The current episode started in the past 7 days. The problem occurs intermittently. The problem has been unchanged. The quality of the pain is described as burning. The pain is at a severity of 4/10. The pain is mild. Associated symptoms include frequency, hesitancy and urgency. Pertinent negatives include no flank pain, hematuria, nausea, sweats or vomiting. She has tried increased fluids for the symptoms. The treatment provided mild relief.    Problems:  Patient Active Problem List   Diagnosis Date Noted   Obesity during pregnancy, antepartum 06/24/2021   Insufficient prenatal care 06/24/2021   Late prenatal care affecting pregnancy in third trimester 06/24/2021   Marijuana use during pregnancy 06/24/2021   Left hand pain 10/30/2020   Arthralgia of both knees and bilateral hands.  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  Told allergic as child but believes she's  taken since.   Zithromax [Azithromycin] Other (See Comments)    Reaction: unknown Told reaction as a child   Medications:  Current Outpatient Medications:    cephALEXin (KEFLEX) 500 MG capsule, Take 1 capsule (500 mg total) by mouth 2 (two) times daily., Disp: 14 capsule, Rfl: 0   albuterol (VENTOLIN HFA) 108 (90 Base) MCG/ACT inhaler, Inhale 1-2 puffs into the lungs every 6 (six) hours as needed for wheezing or shortness of breath., Disp: 8 g, Rfl: 0   buPROPion (WELLBUTRIN XL) 150 MG 24 hr tablet, Take 1 tablet (150 mg total) by mouth daily. If feeling well after one week can double and take 300 mg., Disp: 90 tablet, Rfl: 1   hydrOXYzine (ATARAX) 50 MG tablet, TAKE 1 TABLET BY MOUTH THREE TIMES DAILY AS NEEDED FOR ANXIETY, Disp: 30 tablet, Rfl: 3   levonorgestrel (KYLEENA) 19.5 MG IUD, 1 each by Intrauterine route once., Disp: , Rfl:   Observations/Objective: Patient is well-developed, well-nourished in no acute distress.  Resting comfortably  at home.  Head is normocephalic, atraumatic.  No labored breathing.  Speech is clear and coherent with logical content.  Patient is alert and oriented at baseline.    Assessment and Plan: 1. UTI symptoms - cephALEXin (KEFLEX) 500 MG capsule; Take 1 capsule (500 mg total) by mouth 2 (two) times daily.  Dispense: 14 capsule; Refill: 0  Force fluids AZO over the counter X2 days Follow up if symptoms worsen or do not improve   Follow Up Instructions: I discussed the assessment and treatment plan with the patient. The patient was provided an opportunity to ask questions and all were answered. The patient agreed with the plan and demonstrated an understanding of the instructions.  A copy of instructions were sent to the patient via MyChart unless otherwise noted below.     The patient was advised to call back or seek an in-person evaluation if the symptoms worsen or if the condition fails to improve as anticipated.  Time:  I spent 8 minutes  with the patient via telehealth technology discussing the above problems/concerns.    Evelina Dun, FNP

## 2022-06-29 NOTE — Telephone Encounter (Signed)
Requested medications are due for refill today.  yes  Requested medications are on the active medications list.  yes  Last refill. 03/18/2022  Future visit scheduled.   no  Notes to clinic.  Labs are expired    Requested Prescriptions  Pending Prescriptions Disp Refills   buPROPion (WELLBUTRIN XL) 150 MG 24 hr tablet [Pharmacy Med Name: buPROPion HCl ER (XL) 150 MG Oral Tablet Extended Release 24 Hour] 90 tablet 0    Sig: TAKE 1 TABLET BY MOUTH ONCE DAILY. IF FEELING WELL AFTER 1 WEEK CAN DOUBLE AND TAKE 300MG      Psychiatry: Antidepressants - bupropion Failed - 06/27/2022  1:21 AM      Failed - Cr in normal range and within 360 days    Creatinine  Date Value Ref Range Status  09/16/2014 0.60 0.60 - 1.30 mg/dL Final   Creatinine, Ser  Date Value Ref Range Status  05/30/2021 0.47 0.44 - 1.00 mg/dL Final         Failed - AST in normal range and within 360 days    AST  Date Value Ref Range Status  05/30/2021 14 (L) 15 - 41 U/L Final   SGOT(AST)  Date Value Ref Range Status  04/11/2014 17 15 - 37 Unit/L Final         Failed - ALT in normal range and within 360 days    ALT  Date Value Ref Range Status  05/30/2021 11 0 - 44 U/L Final   SGPT (ALT)  Date Value Ref Range Status  04/11/2014 10 (L) U/L Final    Comment:    14-63 NOTE: New Reference Range 03/20/14          Passed - Completed PHQ-2 or PHQ-9 in the last 360 days      Passed - Last BP in normal range    BP Readings from Last 1 Encounters:  10/21/21 137/88         Passed - Valid encounter within last 6 months    Recent Outpatient Visits           1 month ago Depression, major, single episode, severe ([place])   [employer], [place], PA-C   3 months ago Depression, major, single episode, severe ([place])   [employer], [place], PA-C   8 months ago GAD (generalized anxiety disorder)   [employer], Dani Gobble, PA-C   1 year ago Left hand pain    [facility] [facility], Kelby Aline, FNP   2 years ago No-show for appointment   [facility], Wendee Beavers, [place]

## 2022-06-29 NOTE — Telephone Encounter (Signed)
Please advise on refill request and directions.   Last ov was 05/05/2022 (seen by Mikey Kirschner, PA-C). Patient was started back on 150 mg of wellbutrin and referred to psychiatry.   No future OV scheduled.

## 2022-07-07 ENCOUNTER — Encounter: Payer: Self-pay | Admitting: Psychiatry

## 2022-07-07 ENCOUNTER — Ambulatory Visit (INDEPENDENT_AMBULATORY_CARE_PROVIDER_SITE_OTHER): Payer: Medicaid Other | Admitting: Psychiatry

## 2022-07-07 ENCOUNTER — Other Ambulatory Visit
Admission: RE | Admit: 2022-07-07 | Discharge: 2022-07-07 | Disposition: A | Discharge status: Home / Self Care | Payer: Medicaid Other | Source: Ambulatory Visit | Attending: Psychiatry | Admitting: Psychiatry

## 2022-07-07 VITALS — BP 124/86 | HR 83 | Temp 98.9°F | Ht 63.0 in | Wt 219.2 lb

## 2022-07-07 DIAGNOSIS — F1421 Cocaine dependence, in remission: Secondary | ICD-10-CM | POA: Diagnosis present

## 2022-07-07 DIAGNOSIS — F1211 Cannabis abuse, in remission: Secondary | ICD-10-CM

## 2022-07-07 DIAGNOSIS — F3161 Bipolar disorder, current episode mixed, mild: Secondary | ICD-10-CM | POA: Diagnosis not present

## 2022-07-07 DIAGNOSIS — F17200 Nicotine dependence, unspecified, uncomplicated: Secondary | ICD-10-CM | POA: Insufficient documentation

## 2022-07-07 DIAGNOSIS — F4010 Social phobia, unspecified: Secondary | ICD-10-CM | POA: Diagnosis not present

## 2022-07-07 LAB — TSH: TSH: 1.795 u[IU]/mL (ref 0.350–4.500)

## 2022-07-07 MED ORDER — HYDROXYZINE HCL 10 MG PO TABS: 10.0000 mg | ORAL_TABLET | Freq: Three times a day (TID) | ORAL | 0 refills | Status: DC | PRN

## 2022-07-07 MED ORDER — LAMOTRIGINE 25 MG PO TABS
25.0000 mg | ORAL_TABLET | ORAL | 0 refills | Status: DC
Start: 2022-07-07 — End: 2022-08-03

## 2022-07-07 MED ORDER — BUPROPION HCL ER (XL) 150 MG PO TB24: 150.0000 mg | ORAL_TABLET | Freq: Every day | ORAL | 0 refills | Status: DC

## 2022-07-07 NOTE — Patient Instructions (Signed)
Lamotrigine Tablets What is this medication? LAMOTRIGINE (la MOE tri jeen) prevents and controls seizures in people with epilepsy. It may also be used to treat bipolar disorder. It works by calming overactive nerves in your body. This medicine may be used for other purposes; ask your health care provider or pharmacist if you have questions. COMMON BRAND NAME(S): Lamictal, Subvenite What should I tell my care team before I take this medication? They need to know if you have any of these conditions: Heart disease History of irregular heartbeat Immune system problems Kidney disease Liver disease Low levels of folic acid in the blood Lupus Mental health condition Suicidal thoughts, plans, or attempt by you or a family member An unusual or allergic reaction to lamotrigine, other medications, foods, dyes, or preservatives Pregnant or trying to get pregnant Breastfeeding How should I use this medication? Take this medication by mouth with a glass of water. Follow the directions on the prescription label. Do not chew these tablets. If this medication upsets your stomach, take it with food or milk. Take your doses at regular intervals. Do not take your medication more often than directed. A special MedGuide will be given to you by the pharmacist with each new prescription and refill. Be sure to read this information carefully each time. Talk to your care team about the use of this medication in children. While this medication may be prescribed for children as young as 2 years for selected conditions, precautions do apply. Overdosage: If you think you have taken too much of this medicine contact a poison control center or emergency room at once. NOTE: This medicine is only for you. Do not share this medicine with others. What if I miss a dose? If you miss a dose, take it as soon as you can. If it is almost time for your next dose, take only that dose. Do not take double or extra doses. What may  interact with this medication? Atazanavir Certain medications for irregular heartbeat Certain medications for seizures, such as carbamazepine, phenobarbital, phenytoin, primidone, or valproic acid Estrogen or progestin hormones Lopinavir Rifampin Ritonavir This list may not describe all possible interactions. Give your health care provider a list of all the medicines, herbs, non-prescription drugs, or dietary supplements you use. Also tell them if you smoke, drink alcohol, or use illegal drugs. Some items may interact with your medicine. What should I watch for while using this medication? Visit your care team for regular checks on your progress. If you take this medication for seizures, wear a Medic Alert bracelet or necklace. Carry an identification card with information about your condition, medications, and care team. It is important to take this medication exactly as directed. When first starting treatment, your dose will need to be adjusted slowly. It may take weeks or months before your dose is stable. You should contact your care team if your seizures get worse or if you have any new types of seizures. Do not stop taking this medication unless instructed by your care team. Stopping your medication suddenly can increase your seizures or their severity. This medication may cause serious skin reactions. They can happen weeks to months after starting the medication. Contact your care team right away if you notice fevers or flu-like symptoms with a rash. The rash may be red or purple and then turn into blisters or peeling of the skin. You may also notice a red rash with swelling of the face, lips, or lymph nodes in your neck or under your   arms. This medication may affect your coordination, reaction time, or judgment. Do not drive or operate machinery until you know how this medication affects you. Sit up or stand slowly to reduce the risk of dizzy or fainting spells. Drinking alcohol with this  medication can increase the risk of these side effects. If you are taking this medication for bipolar disorder, it is important to report any changes in your mood to your care team. If your condition gets worse, you get mentally depressed, feel very hyperactive or manic, have difficulty sleeping, or have thoughts of hurting yourself or committing suicide, you need to get help from your care team right away. If you are a caregiver for someone taking this medication for bipolar disorder, you should also report these behavioral changes right away. The use of this medication may increase the chance of suicidal thoughts or actions. Pay special attention to how you are responding while on this medication. Your mouth may get dry. Chewing sugarless gum or sucking hard candy and drinking plenty of water may help. Contact your care team if the problem does not go away or is severe. If you become pregnant while using this medication, you may enroll in the [place] Antiepileptic Drug Pregnancy Registry by calling 1-888-233-2334. This registry collects information about the safety of antiepileptic medication use during pregnancy. This medication may cause a decrease in folic acid. You should make sure that you get enough folic acid while you are taking this medication. Discuss the foods you eat and the vitamins you take with your care team. What side effects may I notice from receiving this medication? Side effects that you should report to your care team as soon as possible: Allergic reactions--skin rash, itching, hives, swelling of the face, lips, tongue, or throat Change in vision Fever, neck pain or stiffness, sensitivity to light, headache, nausea, vomiting, confusion Heart rhythm changes--fast or irregular heartbeat, dizziness, feeling faint or lightheaded, chest pain, trouble breathing Infection--fever, chills, cough, or sore throat Liver injury--right upper belly pain, loss of appetite, nausea,  light-colored stool, dark yellow or brown urine, yellowing skin or eyes, unusual weakness or fatigue Low red blood cell count--unusual weakness or fatigue, dizziness, headache, trouble breathing Rash, fever, and swollen lymph nodes Redness, blistering, peeling or loosening of the skin, including inside the mouth Thoughts of suicide or self-harm, worsening mood, or feelings of depression Unusual bruising or bleeding Side effects that usually do not require medical attention (report to your care team if they continue or are bothersome): Diarrhea Dizziness Drowsiness Headache Nausea Stomach pain Tremors or shaking This list may not describe all possible side effects. Call your doctor for medical advice about side effects. You may report side effects to FDA at 1-800-FDA-1088. Where should I keep my medication? Keep out of the reach of children and pets. Store at 25 degrees C (77 degrees F). Protect from light. Get rid of any unused medication after the expiration date. To get rid of medications that are no longer needed or have expired: Take the medication to a medication take-back program. Check with your pharmacy or law enforcement to find a location. If you cannot return the medication, check the label or package insert to see if the medication should be thrown out in the garbage or flushed down the toilet. If you are not sure, ask your care team. If it is safe to put it in the trash, empty the medication out of the container. Mix the medication with cat litter, dirt, coffee grounds,   or other unwanted substance. Seal the mixture in a bag or container. Put it in the trash. NOTE: This sheet is a summary. It may not cover all possible information. If you have questions about this medicine, talk to your doctor, pharmacist, or health care provider.  2023 Elsevier/Gold Standard (2020-11-29 00:00:00)  
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
## 2022-07-08 LAB — URINE DRUGS OF ABUSE SCREEN W ALC, ROUTINE (REF LAB)
Amphetamines, Urine: NEGATIVE ng/mL
Barbiturate, Ur: NEGATIVE ng/mL
Benzodiazepine Quant, Ur: NEGATIVE ng/mL
Cannabinoid Quant, Ur: NEGATIVE ng/mL
Cocaine (Metab.): NEGATIVE ng/mL
Ethanol U, Quan: NEGATIVE %
Methadone Screen, Urine: NEGATIVE ng/mL
Opiate Quant, Ur: NEGATIVE ng/mL
Phencyclidine, Ur: NEGATIVE ng/mL
Propoxyphene, Urine: NEGATIVE ng/mL

## 2022-08-03 ENCOUNTER — Other Ambulatory Visit: Payer: Self-pay | Admitting: Psychiatry

## 2022-08-03 DIAGNOSIS — F4010 Social phobia, unspecified: Secondary | ICD-10-CM

## 2022-08-03 DIAGNOSIS — F3161 Bipolar disorder, current episode mixed, mild: Secondary | ICD-10-CM

## 2022-08-13 ENCOUNTER — Telehealth: Payer: Self-pay | Admitting: Psychiatry

## 2022-08-13 DIAGNOSIS — F4010 Social phobia, unspecified: Secondary | ICD-10-CM

## 2022-08-13 MED ORDER — HYDROXYZINE HCL 10 MG PO TABS: 10.0000 mg | ORAL_TABLET | Freq: Three times a day (TID) | ORAL | 1 refills | Status: DC | PRN

## 2022-08-13 NOTE — Telephone Encounter (Signed)
Medication refill sent for hydroxyzine.

## 2022-08-17 ENCOUNTER — Telehealth: Payer: Medicaid Other | Admitting: Physician Assistant

## 2022-08-17 DIAGNOSIS — R6889 Other general symptoms and signs: Secondary | ICD-10-CM

## 2022-08-17 MED ORDER — FLUTICASONE PROPIONATE 50 MCG/ACT NA SUSP: 2.0000 | Freq: Every day | NASAL | 0 refills | Status: DC

## 2022-08-17 MED ORDER — PSEUDOEPH-BROMPHEN-DM 30-2-10 MG/5ML PO SYRP
5.0000 mL | ORAL_SOLUTION | Freq: Four times a day (QID) | ORAL | 0 refills | Status: DC | PRN
Start: 2022-08-17 — End: 2022-10-21

## 2022-08-17 MED ORDER — BENZONATATE 100 MG PO CAPS: 100.0000 mg | ORAL_CAPSULE | Freq: Three times a day (TID) | ORAL | 0 refills | Status: DC | PRN

## 2022-08-17 NOTE — Progress Notes (Signed)
E visit for Flu like symptoms   We are sorry that you are not feeling well.  Here is how we plan to help! Based on what you have shared with me it looks like you may have flu-like symptoms that should be watched but do not seem to indicate anti-viral treatment.  Influenza or "the flu" is   an infection caused by a respiratory virus. The flu virus is highly contagious and persons who did not receive their yearly flu vaccination may "catch" the flu from close contact.  We have anti-viral medications to treat the viruses that cause this infection. They are not a "cure" and only shorten the course of the infection. These prescriptions are most effective when they are given within the first 2 days of "flu" symptoms. Antiviral medication are indicated if you have a high risk of complications from the flu. You should  also consider an antiviral medication if you are in close contact with someone who is at risk. These medications can help patients avoid complications from the flu  but have side effects that you should know. Possible side effects from Tamiflu or oseltamivir include nausea, vomiting, diarrhea, dizziness, headaches, eye redness, sleep problems or other respiratory symptoms. You should not take Tamiflu if you have an allergy to oseltamivir or any to the ingredients in Tamiflu.  Based upon your symptoms and potential risk factors I recommend that you follow the flu symptoms recommendation that I have listed below.  This is an infection that is most likely caused by a virus. There are no specific treatments other than to help you with the symptoms until the infection runs its course.  We are sorry you are not feeling well.  Here is how we plan to help!   For nasal congestion, you may use an oral decongestants such as Mucinex D or if you have glaucoma or high blood pressure use plain Mucinex.  Saline nasal spray or nasal drops can help and can safely be used as often as needed for congestion.  For  your congestion, I have prescribed Fluticasone nasal spray one spray in each nostril twice a day  If you do not have a history of heart disease, hypertension, diabetes or thyroid disease, prostate/bladder issues or glaucoma, you may also use Sudafed to treat nasal congestion.  It is highly recommended that you consult with a pharmacist or your primary care physician to ensure this medication is safe for you to take.     If you have a cough, you may use cough suppressants such as Delsym and Robitussin.  If you have glaucoma or high blood pressure, you can also use Coricidin HBP.   For cough I have prescribed for you A prescription cough medication called Tessalon Perles 100 mg. You may take 1-2 capsules every 8 hours as needed for cough and Bromfed DM cough syrup Take 34mL every 6 hours as needed for cough.  If you have a sore or scratchy throat, use a saltwater gargle-  to  teaspoon of salt dissolved in a 4-ounce to 8-ounce glass of warm water.  Gargle the solution for approximately 15-30 seconds and then spit.  It is important not to swallow the solution.  You can also use throat lozenges/cough drops and Chloraseptic spray to help with throat pain or discomfort.  Warm or cold liquids can also be helpful in relieving throat pain.  For headache, pain or general discomfort, you can use Ibuprofen or Tylenol as directed.   Some authorities believe that  zinc sprays or the use of Echinacea may shorten the course of your symptoms.   HOME CARE Only take medications as instructed by your medical team. Be sure to drink plenty of fluids. Water is fine as well as fruit juices, sodas and electrolyte beverages. You may want to stay away from caffeine or alcohol. If you are nauseated, try taking small sips of liquids. How do you know if you are getting enough fluid? Your urine should be a pale yellow or almost colorless. Get rest. Taking a steamy shower or using a humidifier may help nasal congestion and ease  sore throat pain. You can place a towel over your head and breathe in the steam from hot water coming from a faucet. Using a saline nasal spray works much the same way. Cough drops, hard candies and sore throat lozenges may ease your cough. Avoid close contacts especially the very young and the elderly Cover your mouth if you cough or sneeze Always remember to wash your hands.   ANYONE WHO HAS FLU SYMPTOMS SHOULD: Stay home. The flu is highly contagious and going out or to work exposes others! Be sure to drink plenty of fluids. Water is fine as well as fruit juices, sodas and electrolyte beverages. You may want to stay away from caffeine or alcohol. If you are nauseated, try taking small sips of liquids. How do you know if you are getting enough fluid? Your urine should be a pale yellow or almost colorless. Get rest. Taking a steamy shower or using a humidifier may help nasal congestion and ease sore throat pain. Using a saline nasal spray works much the same way. Cough drops, hard candies and sore throat lozenges may ease your cough. Line up a caregiver. Have someone check on you regularly.   GET HELP RIGHT AWAY IF: You cannot keep down liquids or your medications. You become short of breath Your fell like you are going to pass out or loose consciousness. Your symptoms persist after you have completed your treatment plan MAKE SURE YOU  Understand these instructions. Will watch your condition. Will get help right away if you are not doing well or get worse.  Your e-visit answers were reviewed by a board certified advanced clinical practitioner to complete your personal care plan.  Depending on the condition, your plan could have included both over the counter or prescription medications.  If there is a problem please reply  once you have received a response from your provider.  Your safety is important to [place].  If you have drug allergies check your prescription carefully.    You can  use MyChart to ask questions about today's visit, request a non-urgent call back, or ask for a work or school excuse for 24 hours related to this e-Visit. If it has been greater than 24 hours you will need to follow up with your provider, or enter a new e-Visit to address those concerns.  You will get an e-mail in the next two days asking about your experience.  I hope that your e-visit has been valuable and will speed your recovery. Thank you for using e-visits.  I have spent 5 minutes in review of e-visit questionnaire, review and updating patient chart, medical decision making and response to patient.   Margaretann Loveless, PA-C

## 2022-08-28 ENCOUNTER — Emergency Department: Payer: Medicaid Other

## 2022-08-28 ENCOUNTER — Emergency Department
Admission: EM | Admit: 2022-08-28 | Discharge: 2022-08-28 | Disposition: A | Discharge status: Home / Self Care | Payer: Medicaid Other | Attending: Emergency Medicine | Admitting: Emergency Medicine

## 2022-08-28 ENCOUNTER — Other Ambulatory Visit: Payer: Self-pay

## 2022-08-28 DIAGNOSIS — X58XXXA Exposure to other specified factors, initial encounter: Secondary | ICD-10-CM | POA: Diagnosis not present

## 2022-08-28 DIAGNOSIS — S3992XA Unspecified injury of lower back, initial encounter: Secondary | ICD-10-CM | POA: Diagnosis present

## 2022-08-28 DIAGNOSIS — R109 Unspecified abdominal pain: Secondary | ICD-10-CM | POA: Insufficient documentation

## 2022-08-28 DIAGNOSIS — S39012A Strain of muscle, fascia and tendon of lower back, initial encounter: Secondary | ICD-10-CM | POA: Insufficient documentation

## 2022-08-28 DIAGNOSIS — Z9049 Acquired absence of other specified parts of digestive tract: Secondary | ICD-10-CM | POA: Diagnosis not present

## 2022-08-28 DIAGNOSIS — R161 Splenomegaly, not elsewhere classified: Secondary | ICD-10-CM | POA: Diagnosis not present

## 2022-08-28 LAB — URINALYSIS, ROUTINE W REFLEX MICROSCOPIC
Bilirubin Urine: NEGATIVE
Glucose, UA: NEGATIVE mg/dL
Hgb urine dipstick: NEGATIVE
Ketones, ur: NEGATIVE mg/dL
Leukocytes,Ua: NEGATIVE
Nitrite: NEGATIVE
Protein, ur: NEGATIVE mg/dL
Specific Gravity, Urine: 1.018 (ref 1.005–1.030)
pH: 5 (ref 5.0–8.0)

## 2022-08-28 LAB — POC URINE PREG, ED: Preg Test, Ur: NEGATIVE

## 2022-08-28 MED ORDER — CYCLOBENZAPRINE HCL 5 MG PO TABS: 5.0000 mg | ORAL_TABLET | Freq: Three times a day (TID) | ORAL | 0 refills | Status: DC | PRN

## 2022-08-28 MED ORDER — LIDOCAINE 5 % EX PTCH
1.0000 | MEDICATED_PATCH | Freq: Once | CUTANEOUS | Status: DC
  Administered 2022-08-28: 1 via TRANSDERMAL
  Filled 2022-08-28: qty 1

## 2022-08-28 MED ORDER — LIDOCAINE 5 % EX PTCH: 1.0000 | MEDICATED_PATCH | Freq: Two times a day (BID) | CUTANEOUS | 0 refills | Status: DC | PRN

## 2022-08-28 MED ORDER — PREDNISONE 20 MG PO TABS: 40.0000 mg | ORAL_TABLET | Freq: Every day | ORAL | 0 refills | Status: DC

## 2022-08-28 MED ORDER — CYCLOBENZAPRINE HCL 10 MG PO TABS
10.0000 mg | ORAL_TABLET | Freq: Once | ORAL | Status: AC
  Administered 2022-08-28: 10 mg via ORAL
  Filled 2022-08-28: qty 1

## 2022-08-28 NOTE — ED Provider Notes (Signed)
[facility] Emergency Department Provider Note     Event Date/Time   First MD Initiated Contact with Patient 08/28/22 2111     (approximate)   History   Back Pain   HPI  Terri [name] is a 26 y.o. female history of GAD, anxiety, pseudotumor cerebri, presents to the ED with complaints of flank pain for the last 3 days.  Patient denies history of kidney stones.  She also denies any urinary symptoms including urgency, frequency, or hematuria.  Denies any bladder or bowel incontinence and denies any recent injury, trauma, fall.  Patient does endorse starting on Lamictal about 6 to 8 weeks ago, and wonders if this pain could be a side effect.  Patient was treated for influenza 3 weeks ago. Denies FCS, NVD.   Physical Exam   Triage Vital Signs: ED Triage Vitals [08/28/22 2020]  Enc Vitals Group     BP (!) 139/98     Pulse Rate 94     Resp 18     Temp 98.5 F (36.9 C)     Temp Source Oral     SpO2 97 %     Weight      Height 5\' 3"  (1.6 m)     Head Circumference      Peak Flow      Pain Score 10     Pain Loc      Pain Edu?      Excl. in GC?     Most recent vital signs: Vitals:   08/28/22 2020  BP: (!) 139/98  Pulse: 94  Resp: 18  Temp: 98.5 F (36.9 C)  SpO2: 97%    General Awake, no distress. NAD CV:  Good peripheral perfusion.  RESP:  Normal effort.  ABD:  No distention.  MSK:  Spinal alignment without midline tenderness, spasm, deformity, or step-off.  Patient flecked range of motion of both knees bilaterally.  Mildly tender to palpation to the left SI joint region. NEURO: Cranial nerves II to XII grossly intact.  Normal LE DTRs bilaterally.  Normal toe dorsiflexion foot eversion on exam.  ED Results / Procedures / Treatments   Labs (all labs ordered are listed, but only abnormal results are displayed) Labs Reviewed  URINALYSIS, ROUTINE W REFLEX MICROSCOPIC - Abnormal; Notable for the following components:      Result Value    Color, Urine YELLOW (*)    APPearance HAZY (*)    All other components within normal limits  POC URINE PREG, ED   EKG  RADIOLOGY  I personally viewed and evaluated these images as part of my medical decision making, as well as reviewing the written report by the radiologist.  ED Provider Interpretation: no acute findings  CT Renal Stone Study  Result Date: 08/28/2022 CLINICAL DATA:  Left flank pain. EXAM: CT ABDOMEN AND PELVIS WITHOUT CONTRAST TECHNIQUE: Multidetector CT imaging of the abdomen and pelvis was performed following the standard protocol without IV contrast. RADIATION DOSE REDUCTION: This exam was performed according to the departmental dose-optimization program which includes automated exposure control, adjustment of the mA and/or kV according to patient size and/or use of iterative reconstruction technique. COMPARISON:  March 20, 2019 FINDINGS: Lower chest: No acute abnormality. Hepatobiliary: A 5 mm focus of parenchymal low attenuation is seen within the right lobe of the liver. Status post cholecystectomy. No biliary dilatation. Pancreas: Unremarkable. No pancreatic ductal dilatation or surrounding inflammatory changes. Spleen: The spleen is mildly enlarged, without focal abnormality.  Adrenals/Urinary Tract: Adrenal glands are unremarkable. Kidneys are normal, without renal calculi, focal lesion, or hydronephrosis. The urinary bladder is poorly distended and subsequently limited in evaluation. Stomach/Bowel: Stomach is within normal limits. Appendix appears normal. No evidence of bowel wall thickening, distention, or inflammatory changes. Vascular/Lymphatic: No significant vascular findings are present. No enlarged abdominal or pelvic lymph nodes. Reproductive: A properly positioned IUD is noted. Uterus and bilateral adnexa are unremarkable. Other: No abdominal wall hernia or abnormality. No abdominopelvic ascites. Musculoskeletal: No acute or significant osseous findings.  IMPRESSION: 1. No evidence of renal calculi or hydronephrosis. 2. Evidence of prior cholecystectomy. 3. Mild splenomegaly. Electronically Signed   By: Aram Candela M.D.   On: 08/28/2022 21:03     PROCEDURES:  Critical Care performed: No  Procedures   MEDICATIONS ORDERED IN ED: Medications - No data to display   IMPRESSION / MDM / ASSESSMENT AND PLAN / ED COURSE  I reviewed the triage vital signs and the nursing notes.                              Differential diagnosis includes, but is not limited to, kidney stone, pyelonephritis, hydronephrosis, UTI, lumbar strain, herpes zoster  Patient's presentation is most consistent with acute complicated illness / injury requiring diagnostic workup.  Patient's diagnosis is consistent with lumbar strain.  Allergies unclear this time as patient denies any recent injury, trauma, or fall.  Symptoms could represent a side effect of her recently prescribed Lamictal.  Otherwise without red flags on exam.  Reassuring lab workup and a negative CT based on my interpretation.  Patient will be discharged home with prescriptions for lidocaine patches, Flexeril, and prednisone course. Patient is to follow up with her primary provider as needed or otherwise directed. Patient is given ED precautions to return to the ED for any worsening or new symptoms.  FINAL CLINICAL IMPRESSION(S) / ED DIAGNOSES   Final diagnoses:  Strain of lumbar region, initial encounter     Rx / DC Orders   ED Discharge Orders     None        Note:  This document was prepared using Dragon voice recognition software and may include unintentional dictation errors.    Lissa Hoard, PA-C 08/28/22 2144    Minna Antis, MD 08/28/22 2204

## 2022-08-28 NOTE — Discharge Instructions (Signed)
Your exam and CT scan are normal and reassuring. Take the prescription meds as directed. Follow-up with your provider as needed.

## 2022-08-28 NOTE — ED Triage Notes (Signed)
Pt reports back and flank pain x 3 days. Reports feels similar to kidney pain she has had in past. Denies urinary symptoms. Denies numbness or tingling in extremities. Ambulatory in triage. Denies known fall or trauma. Pt reports treated for flu 3 wks ago and wondering if pain correlated with medicine.

## 2022-09-02 ENCOUNTER — Ambulatory Visit (INDEPENDENT_AMBULATORY_CARE_PROVIDER_SITE_OTHER): Payer: Medicaid Other | Admitting: Psychiatry

## 2022-09-02 ENCOUNTER — Encounter: Payer: Self-pay | Admitting: Psychiatry

## 2022-09-02 VITALS — BP 126/81 | HR 84 | Temp 98.2°F | Ht 63.0 in | Wt 219.6 lb

## 2022-09-02 DIAGNOSIS — F1421 Cocaine dependence, in remission: Secondary | ICD-10-CM | POA: Diagnosis not present

## 2022-09-02 DIAGNOSIS — F3161 Bipolar disorder, current episode mixed, mild: Secondary | ICD-10-CM | POA: Diagnosis not present

## 2022-09-02 DIAGNOSIS — F17200 Nicotine dependence, unspecified, uncomplicated: Secondary | ICD-10-CM | POA: Diagnosis not present

## 2022-09-02 DIAGNOSIS — F1211 Cannabis abuse, in remission: Secondary | ICD-10-CM

## 2022-09-02 DIAGNOSIS — F4010 Social phobia, unspecified: Secondary | ICD-10-CM | POA: Diagnosis not present

## 2022-09-02 MED ORDER — LAMOTRIGINE 25 MG PO TABS: 75.0000 mg | ORAL_TABLET | ORAL | 1 refills | Status: DC

## 2022-09-02 MED ORDER — BUPROPION HCL ER (XL) 150 MG PO TB24: 150.0000 mg | ORAL_TABLET | Freq: Every day | ORAL | 0 refills | Status: DC

## 2022-09-02 NOTE — Progress Notes (Signed)
BH MD OP Progress Note  09/02/2022 1:53 PM Terri [name]  MRN:  [id number]  Chief Complaint:  Chief Complaint  Patient presents with   Follow-up   Medication Refill   Depression   Anxiety   HPI: Terri [name] is a 27 year old Caucasian female, lives [place] f[place]c [place], currently unemployed, has a history of bipolar disorder, social anxiety disorder, tobacco use disorder, cocaine and cannabis use disorder in remission, pseudotumor cerebri per report, recent flulike symptoms was evaluated in office today.  Patient today reports she is currently improving on the Lamictal.  She is currently taking 25 mg twice a day and tolerating it well.  Patient reports she went to the emergency department with back pain recently however she started having her menstrual period the next day and realized that that may have had something to do with it.  Patient also reports her whole family was sick with flulike symptoms back-to-back and later on she also got sick prior to her emergency department visit.  She currently feels much better.  Her back pain has resolved as well.  Patient reports she continues to have mild irritability otherwise doing fairly well.  Denies any significant depression or manic symptoms.  She reports she has been able to cope with stress better than before.  Has not had any sadness in spite of everything that happened in the past few weeks.  That is a relief.  Patient continues to have social anxiety, has not been able to schedule an appointment with therapist.  Motivated to do so.  Patient appeared to be alert, oriented to person place time situation.  3 word memory immediate 3 out of 3, after 5 minutes 3 out of 3.  Patient was able to do calculation well.  Attention and focus seemed to be good.  Patient denies any other concerns today.  Visit Diagnosis:    ICD-10-CM   1. Bipolar 1 disorder, mixed, mild (HCC)  F31.61 lamoTRIgine (LAMICTAL) 25 MG tablet    buPROPion  (WELLBUTRIN XL) 150 MG 24 hr tablet    2. Social anxiety disorder  F40.10 lamoTRIgine (LAMICTAL) 25 MG tablet    3. Tobacco use disorder  F17.200     4. Cocaine use disorder, moderate, in sustained remission (HCC)  F14.21     5. Cannabis use disorder, mild, in sustained remission  F12.11       Past Psychiatric History: Reviewed past psychiatric history from progress note on 07/07/2022.  Past Medical History:  Past Medical History:  Diagnosis Date   Anxiety    No pertinent past medical history    Pseudotumor cerebri     Past Surgical History:  Procedure Laterality Date   CHOLECYSTECTOMY N/A 11/04/2018   Procedure: LAPAROSCOPIC CHOLECYSTECTOMY;  Surgeon: Henrene Dodge, MD;  Location: [facility] ORS;  Service: General;  Laterality: N/A;   DILATION AND EVACUATION N/A 07/13/2017   Procedure: DILATATION AND EVACUATION;  Surgeon: Ward, Elenora Fender, MD;  Location: [facility] ORS;  Service: Gynecology;  Laterality: N/A;   ERCP N/A 11/14/2018   Procedure: ENDOSCOPIC RETROGRADE CHOLANGIOPANCREATOGRAPHY (ERCP);  Surgeon: Midge Minium, MD;  Location: [facility] ENDOSCOPY;  Service: Endoscopy;  Laterality: N/A;    Family Psychiatric History: Reviewed family psychiatric history from progress note on 07/07/2022.  Family History:  Family History  Problem Relation Age of Onset   Drug abuse Mother    Arthritis Mother    Thyroid disease Mother    Depression Mother    Anxiety disorder Mother  Suicidality Mother    ALS Maternal Grandfather    Anxiety disorder Maternal Grandmother    Depression Maternal Grandmother    COPD Maternal Grandmother    ADD / ADHD Son    Breast cancer Neg Hx    Colon cancer Neg Hx     Social History: Reviewed social history from progress note on 07/07/2022. Social History   Socioeconomic History   Marital status: Single    Spouse name: Not on file   Number of children: 3   Years of education: Not on file   Highest education level: 12th grade  Occupational History    Occupation: SAHM  Tobacco Use   Smoking status: Every Day    Packs/day: 0.50    Years: 8.00    Total pack years: 4.00    Types: Cigarettes   Smokeless tobacco: Never  Vaping Use   Vaping Use: Never used  Substance and Sexual Activity   Alcohol use: Yes    Comment: social   Drug use: Yes    Types: Marijuana, Cocaine   Sexual activity: Yes    Partners: Male    Birth control/protection: I.U.D.  Other Topics Concern   Not on file  Social History Narrative   Not on file   Social Determinants of Health   Financial Resource Strain: Not on file  Food Insecurity: Not on file  Transportation Needs: Not on file  Physical Activity: Not on file  Stress: Not on file  Social Connections: Not on file    Allergies:  Allergies  Allergen Reactions   Sulfa Antibiotics Other (See Comments)    Reaction: unknown.  Told allergic as child but believes she's taken since.   Zithromax [Azithromycin] Other (See Comments)    Reaction: unknown Told reaction as a child    Metabolic Disorder Labs: Lab Results  Component Value Date   HGBA1C 5.3 06/10/2021   No results found for: "PROLACTIN" No results found for: "CHOL", "TRIG", "HDL", "CHOLHDL", "VLDL", "LDLCALC" Lab Results  Component Value Date   TSH 1.795 07/07/2022   TSH 0.995 01/17/2021    Therapeutic Level Labs: No results found for: "LITHIUM" No results found for: "VALPROATE" No results found for: "CBMZ"  Current Medications: Current Outpatient Medications  Medication Sig Dispense Refill   acetaminophen (TYLENOL) 325 MG tablet Take 650 mg by mouth every 6 (six) hours as needed for moderate pain or headache.     brompheniramine-pseudoephedrine-DM 30-2-10 MG/5ML syrup Take 5 mLs by mouth 4 (four) times daily as needed. 120 mL 0   fluticasone (FLONASE) 50 MCG/ACT nasal spray Place 2 sprays into both nostrils daily. 16 g 0   hydrOXYzine (ATARAX) 10 MG tablet Take 1 tablet (10 mg total) by mouth 3 (three) times daily as needed for  anxiety. 90 tablet 1   levonorgestrel (KYLEENA) 19.5 MG IUD 1 each by Intrauterine route once.     albuterol (VENTOLIN HFA) 108 (90 Base) MCG/ACT inhaler Inhale 1-2 puffs into the lungs every 6 (six) hours as needed for wheezing or shortness of breath. (Patient not taking: Reported on 09/02/2022) 8 g 0   buPROPion (WELLBUTRIN XL) 150 MG 24 hr tablet Take 1 tablet (150 mg total) by mouth daily. 90 tablet 0   cyclobenzaprine (FLEXERIL) 5 MG tablet Take 1 tablet (5 mg total) by mouth 3 (three) times daily as needed. (Patient not taking: Reported on 09/02/2022) 15 tablet 0   lamoTRIgine (LAMICTAL) 25 MG tablet Take 3 tablets (75 mg total) by mouth as directed. Take  2 tablets daily morning and 1 tablet daily in the evening. 90 tablet 1   No current facility-administered medications for this visit.     Musculoskeletal: Strength & Muscle Tone: within normal limits Gait & Station: normal Patient leans: N/A  Psychiatric Specialty Exam: Review of Systems  Psychiatric/Behavioral:  The patient is nervous/anxious.        Irritable  All other systems reviewed and are negative.   Blood pressure 126/81, pulse 84, temperature 98.2 F (36.8 C), temperature source Oral, height 5\' 3"  (1.6 m), weight 219 lb 9.6 oz (99.6 kg), SpO2 98 %, not currently breastfeeding.Body mass index is 38.9 kg/m.  General Appearance: Casual  Eye Contact:  Fair  Speech:  Clear and Coherent  Volume:  Normal  Mood:  Anxious and Irritable  Affect:  Appropriate  Thought Process:  Goal Directed and Descriptions of Associations: Intact  Orientation:  Full (Time, Place, and Person)  Thought Content: Logical   Suicidal Thoughts:  No  Homicidal Thoughts:  No  Memory:  Immediate;   Fair Recent;   Fair Remote;   Fair  Judgement:  Fair  Insight:  Fair  Psychomotor Activity:  Normal  Concentration:  Concentration: Fair and Attention Span: Fair  Recall:  [employer] of Knowledge: Fair  Language: Fair  Akathisia:  No  Handed:   Right  AIMS (if indicated): not done  Assets:  Communication Skills Desire for Improvement Housing Social Support Talents/Skills  ADL's:  Intact  Cognition: WNL  Sleep:  Fair   Screenings: GAD-7    Flowsheet Row Office Visit from 09/02/2022 in [facility] Office Visit from 07/07/2022 in [facility] Video Visit from 05/05/2022 in [facility] Office Visit from 08/05/2021 in [facility] Video Visit from 07/30/2021 in [facility]  Total GAD-7 Score 6 11 16 9 12       PHQ2-9    Flowsheet Row Office Visit from 09/02/2022 in [facility] Office Visit from 07/07/2022 in [facility] Video Visit from 03/18/2022 in [facility] Office Visit from 10/21/2021 in [facility] Video Visit from 07/30/2021 in [facility]  PHQ-2 Total Score 0 1 2 6 1   PHQ-9 Total Score 8 9 10 25 10       Flowsheet Row Office Visit from 09/02/2022 in [facility] ED from 08/28/2022 in [facility] Office Visit from 07/07/2022 in [facility]  C-SSRS RISK CATEGORY No Risk No Risk No Risk        Assessment and Plan: MELENDA WARNING is a 27 year old Caucasian female, lives with her fianc, has a history of bipolar disorder, multiple medical problems as noted, good response to Lamictal, will benefit from dosage readjustment.  Plan as noted below.  Plan Bipolar disorder type I mixed mild-improving Increase Lamictal to 75 mg p.o. daily Wellbutrin XL 150 mg p.o. daily-reduced dosage.  Social anxiety disorder-unstable Patient referred for CBT-pending.  I have communicated with staff. Hydroxyzine 10 mg p.o. 3 times daily as needed for anxiety attacks Will consider adding an SSRI/SNRI in the future as needed  Tobacco use disorder-unstable Provided  counseling for 5 minutes. Provided resources-APP - 'stayquitcoach' Patient is not interested in nicotine replacement therapy, chantix at this time  Cocaine use disorder in remission/cannabis use disorder in remission-Will monitor closely Reviewed and discussed labs-urine drug screen-07/07/2022-negative  Reviewed and discussed labs-07/07/2022-TSH-within normal limits.  Follow-up in clinic in 2 months or  sooner if needed. This note was generated in part or whole with voice recognition software. Voice recognition is usually quite accurate but there are transcription errors that can and very often do occur. I apologize for any typographical errors that were not detected and corrected.     Jomarie Longs, MD 09/03/2022, 9:29 AM

## 2022-09-02 NOTE — Patient Instructions (Signed)
Terri [name] QUIT NOW  APP ON YOUR SMART PHONE - 'Deltana'  Managing the Challenge of Quitting Smoking Quitting smoking is a physical and mental challenge. You may have cravings, withdrawal symptoms, and temptation to smoke. Before quitting, work with your health care provider to make a plan that can help you manage quitting. Making a plan before you quit may keep you from smoking when you have the urge to smoke while trying to quit. How to manage lifestyle changes Managing stress Stress can make you want to smoke, and wanting to smoke may cause stress. It is important to find ways to manage your stress. You could try some of the following: Practice relaxation techniques. Breathe slowly and deeply, in through your nose and out through your mouth. Listen to music. Soak in a bath or take a shower. Imagine a peaceful place or vacation. Get some support. Talk with family or friends about your stress. Join a support group. Talk with a counselor or therapist. Get some physical activity. Go for a walk, run, or bike ride. Play a favorite sport. Practice yoga.  Medicines Talk with your health care provider about medicines that might help you deal with cravings and make quitting easier for you. Relationships Social situations can be difficult when you are quitting smoking. To manage this, you can: Avoid parties and other social situations where people might be smoking. Avoid alcohol. Leave right away if you have the urge to smoke. Explain to your family and friends that you are quitting smoking. Ask for support and let them know you might be a bit grumpy. Plan activities where smoking is not an option. General instructions Be aware that many people gain weight after they quit smoking. However, not everyone does. To keep from gaining weight, have a plan in place before you quit, and stick to the plan after you quit. Your plan should include: Eating healthy snacks. When you have a craving, it may  help to: Eat popcorn, or try carrots, celery, or other cut vegetables. Chew sugar-free gum. Changing how you eat. Eat small portion sizes at meals. Eat 4-6 small meals throughout the day instead of 1-2 large meals a day. Be mindful when you eat. You should avoid watching television or doing other things that might distract you as you eat. Exercising regularly. Make time to exercise each day. If you do not have time for a long workout, do short bouts of exercise for 5-10 minutes several times a day. Do some form of strengthening exercise, such as weight lifting. Do some exercise that gets your heart beating and causes you to breathe deeply, such as walking fast, running, swimming, or biking. This is very important. Drinking plenty of water or other low-calorie or no-calorie drinks. Drink enough fluid to keep your urine pale yellow.  How to recognize withdrawal symptoms Your body and mind may experience discomfort as you try to get used to not having nicotine in your system. These effects are called withdrawal symptoms. They may include: Feeling hungrier than normal. Having trouble concentrating. Feeling irritable or restless. Having trouble sleeping. Feeling depressed. Craving a cigarette. These symptoms may surprise you, but they are normal to have when quitting smoking. To manage withdrawal symptoms: Avoid places, people, and activities that trigger your cravings. Remember why you want to quit. Get plenty of sleep. Avoid coffee and other drinks that contain caffeine. These may worsen some of your symptoms. How to manage cravings Come up with a plan for how to deal with your cravings.  The plan should include the following: A definition of the specific situation you want to deal with. An activity or action you will take to replace smoking. A clear idea for how this action will help. The name of someone who could help you with this. Cravings usually last for 5-10 minutes. Consider  taking the following actions to help you with your plan to deal with cravings: Keep your mouth busy. Chew sugar-free gum. Suck on hard candies or a straw. Brush your teeth. Keep your hands and body busy. Change to a different activity right away. Squeeze or play with a ball. Do an activity or a hobby, such as making bead jewelry, practicing needlepoint, or working with wood. Mix up your normal routine. Take a short exercise break. Go for a quick walk, or run up and down stairs. Focus on doing something kind or helpful for someone else. Call a friend or family member to talk during a craving. Join a support group. Contact a quitline. Where to find support To get help or find a support group: Call the [facility]'s Smoking Quitline: 1-800-QUIT-NOW (302) 159-6824) Text QUIT to SmokefreeTXT: 381017 Where to find more information Visit these websites to find more information on quitting smoking: U.S. Department of Health and Human Services: www.smokefree.gov American Lung Association: www.freedomfromsmoking.org Centers for Disease Control and Prevention (CDC): http://www.wolf.info/ American Heart Association: www.heart.org Contact a health care provider if: You want to change your plan for quitting. The medicines you are taking are not helping. Your eating feels out of control or you cannot sleep. You feel depressed or become very anxious. Summary Quitting smoking is a physical and mental challenge. You will face cravings, withdrawal symptoms, and temptation to smoke again. Preparation can help you as you go through these challenges. Try different techniques to manage stress, handle social situations, and prevent weight gain. You can deal with cravings by keeping your mouth busy (such as by chewing gum), keeping your hands and body busy, calling family or friends, or contacting a quitline for people who want to quit smoking. You can deal with withdrawal symptoms by avoiding places where  people smoke, getting plenty of rest, and avoiding drinks that contain caffeine. This information is not intended to replace advice given to you by your health care provider. Make sure you discuss any questions you have with your health care provider. Document Revised: 08/08/2021 Document Reviewed: 08/08/2021 Elsevier Patient Education  [place].

## 2022-09-12 ENCOUNTER — Telehealth: Payer: Medicaid Other | Admitting: Nurse Practitioner

## 2022-09-12 DIAGNOSIS — N3000 Acute cystitis without hematuria: Secondary | ICD-10-CM | POA: Diagnosis not present

## 2022-09-12 MED ORDER — NITROFURANTOIN MONOHYD MACRO 100 MG PO CAPS: 100.0000 mg | ORAL_CAPSULE | Freq: Two times a day (BID) | ORAL | 0 refills | Status: AC

## 2022-09-12 NOTE — Progress Notes (Signed)

## 2022-09-23 ENCOUNTER — Telehealth: Payer: Self-pay | Admitting: Licensed Clinical Social Worker

## 2022-09-23 ENCOUNTER — Ambulatory Visit (INDEPENDENT_AMBULATORY_CARE_PROVIDER_SITE_OTHER): Payer: Medicaid Other | Admitting: Licensed Clinical Social Worker

## 2022-09-23 DIAGNOSIS — F3161 Bipolar disorder, current episode mixed, mild: Secondary | ICD-10-CM | POA: Diagnosis not present

## 2022-09-23 DIAGNOSIS — F4010 Social phobia, unspecified: Secondary | ICD-10-CM
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
## 2022-09-23 NOTE — Telephone Encounter (Signed)
Attempted to contact patient regarding her concern about medications.  Had to leave a voicemail.

## 2022-09-23 NOTE — Telephone Encounter (Signed)
Pt expressed in session that she had a question about her hydroxyzine and stated that she did not feel it was working and wanted me to reach out with her concern. Thank you.

## 2022-09-25 ENCOUNTER — Telehealth: Payer: Self-pay | Admitting: Psychiatry

## 2022-09-25 NOTE — Telephone Encounter (Signed)
Attempted to contact patient again-date 09/25/2022.  Had to leave a voicemail again.

## 2022-10-03 IMAGING — US US ABDOMEN LIMITED RUQ/ASCITES
1 series · 14 of 25 positions shown · non-contrast
Comparison: 11/13/2018

CLINICAL DATA: Upper abdominal pain, nausea

EXAM:
ULTRASOUND ABDOMEN LIMITED RIGHT UPPER QUADRANT

[Series 1: us abdomen limited ruq (liver/gb) · 14 of 35 slices shown]
[im 1/35]
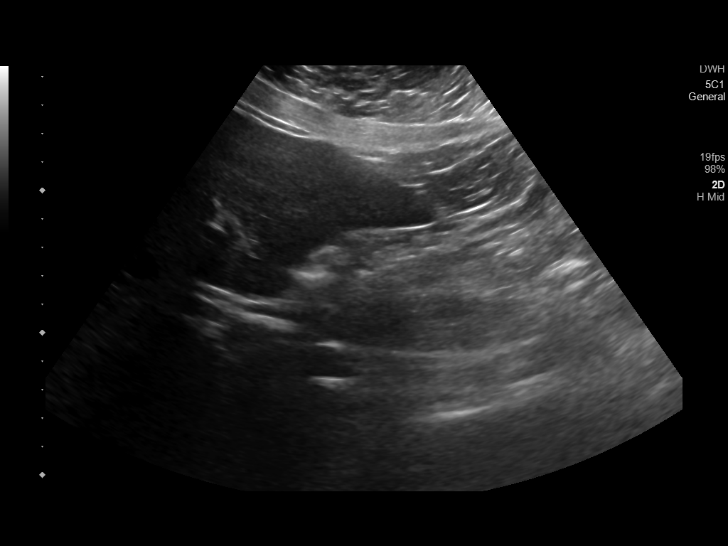
[im 3/35]
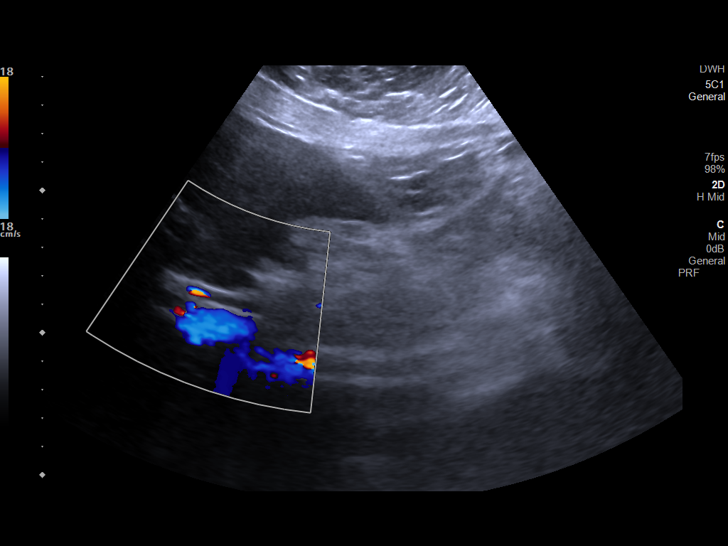
[im 6/35]
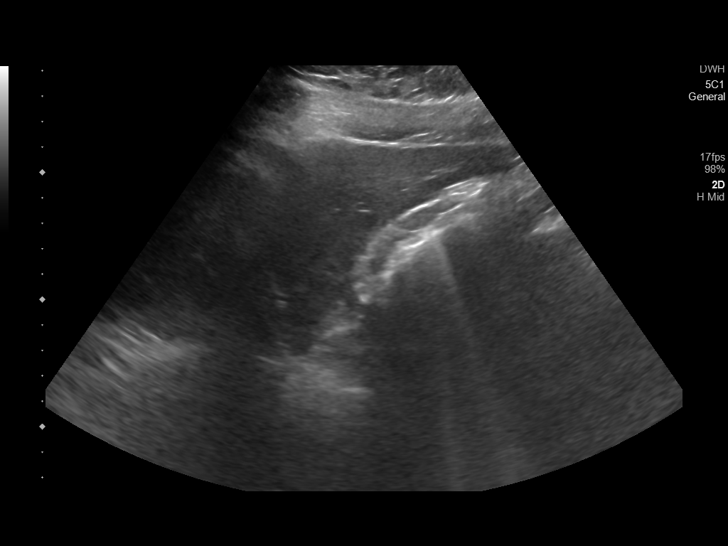
[im 9/35]
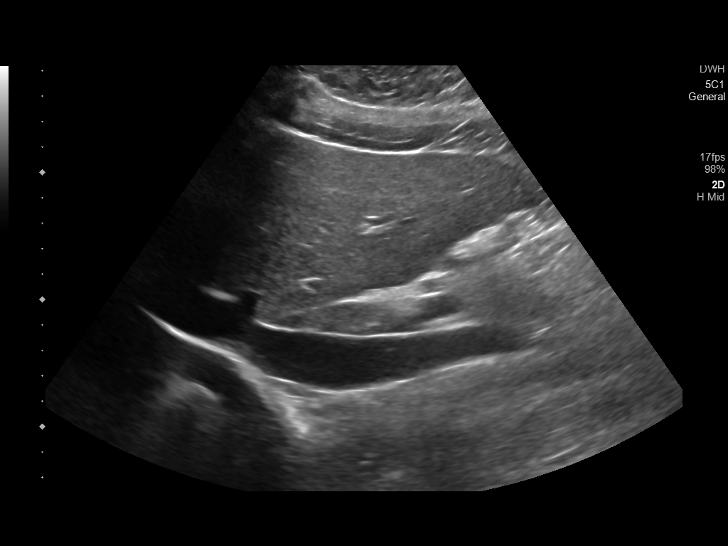
[im 12/35]
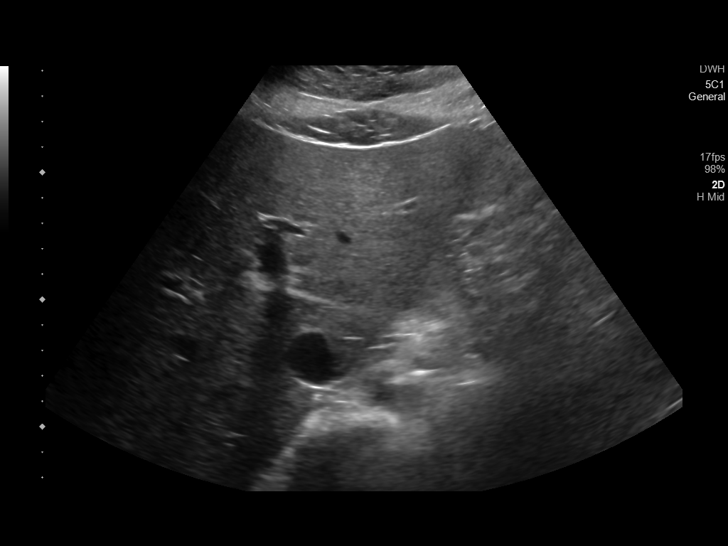
[im 13/35]
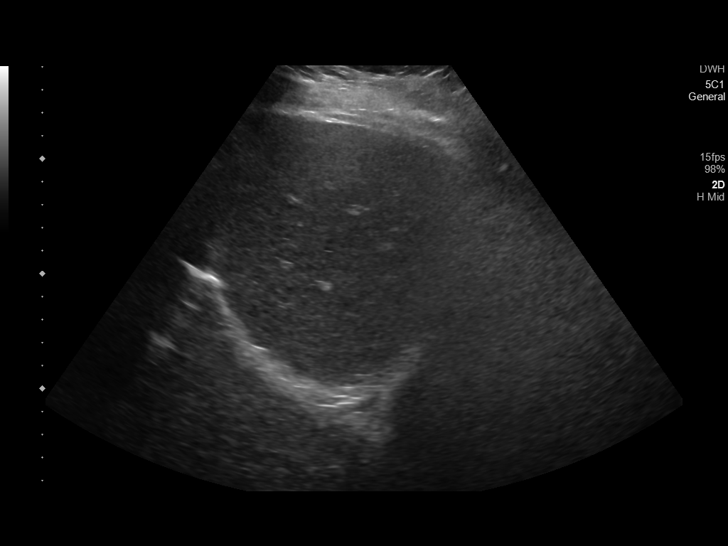
[im 16/35]
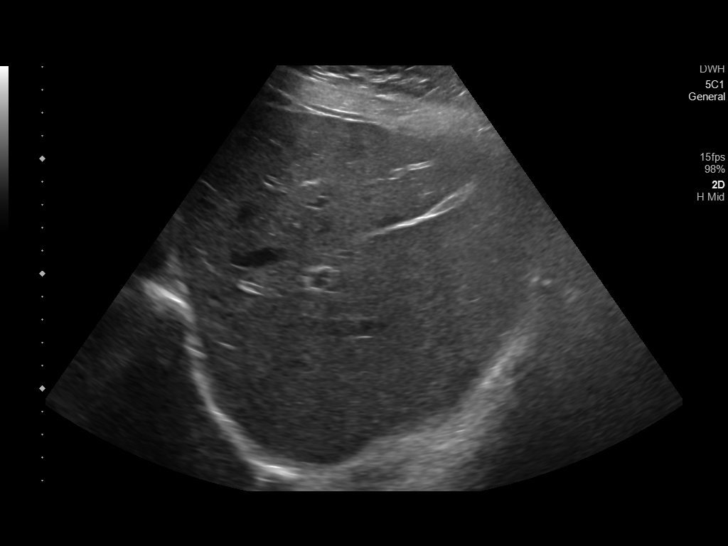
[im 19/35]
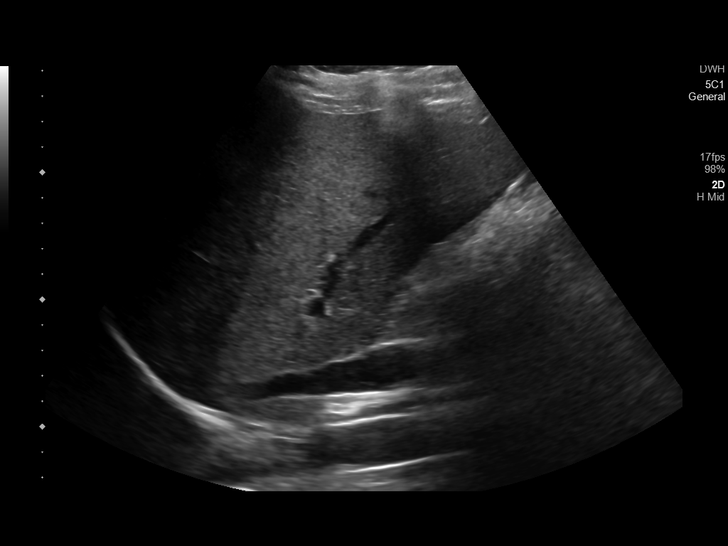
[im 22/35]
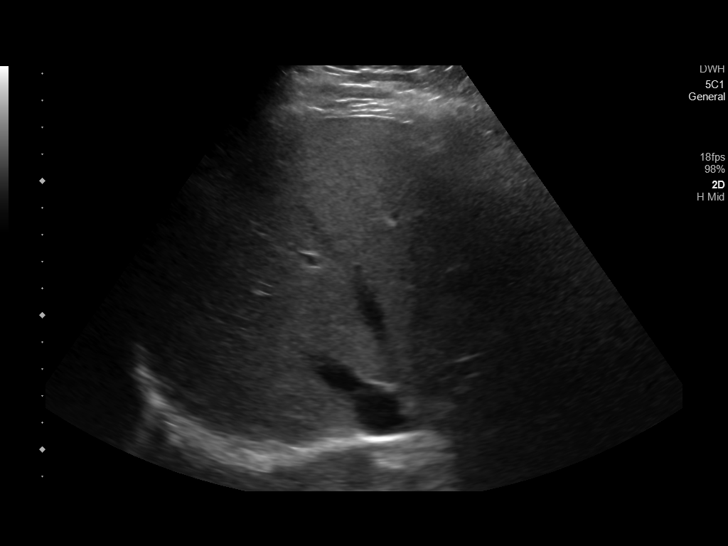
[im 23/35]
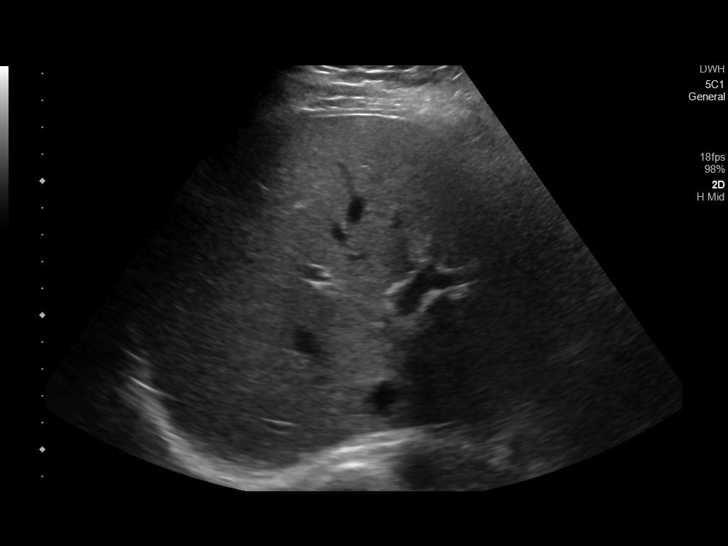
[im 26/35]
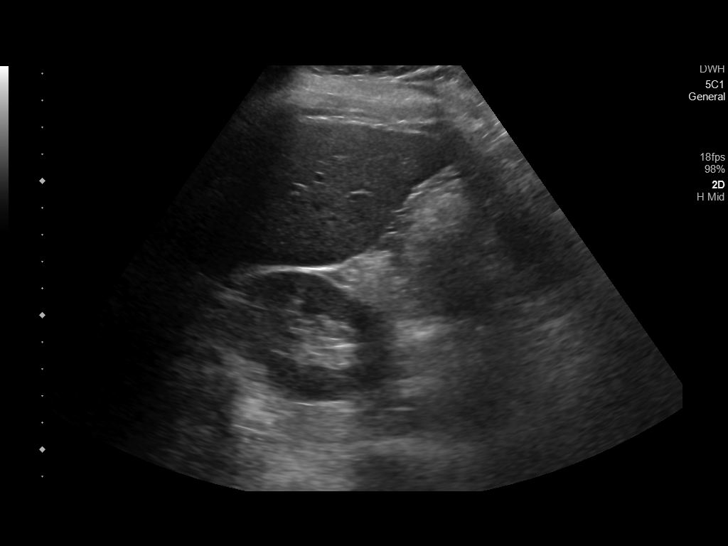
[im 29/35]
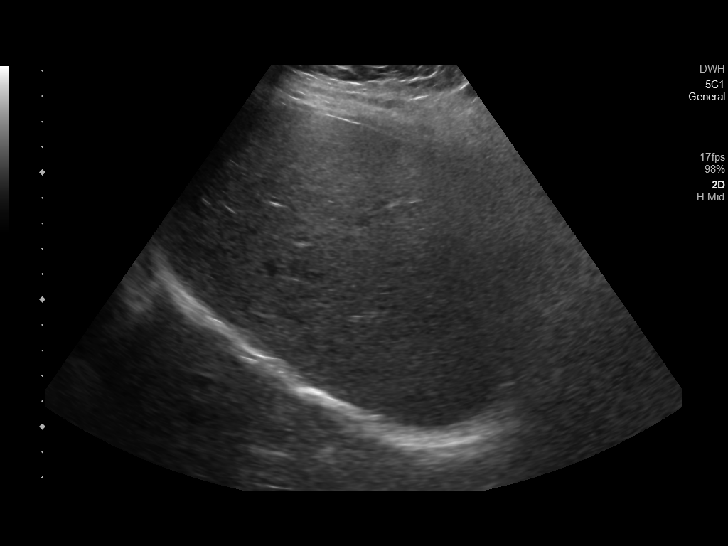
[im 32/35]
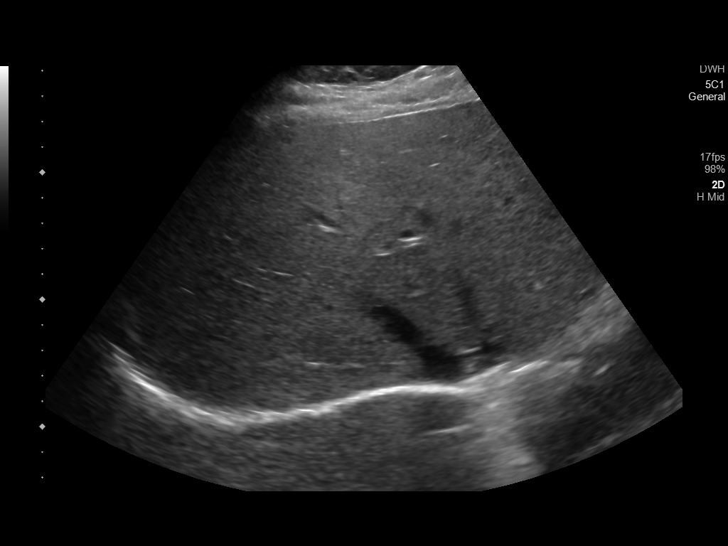
[im 35/35]
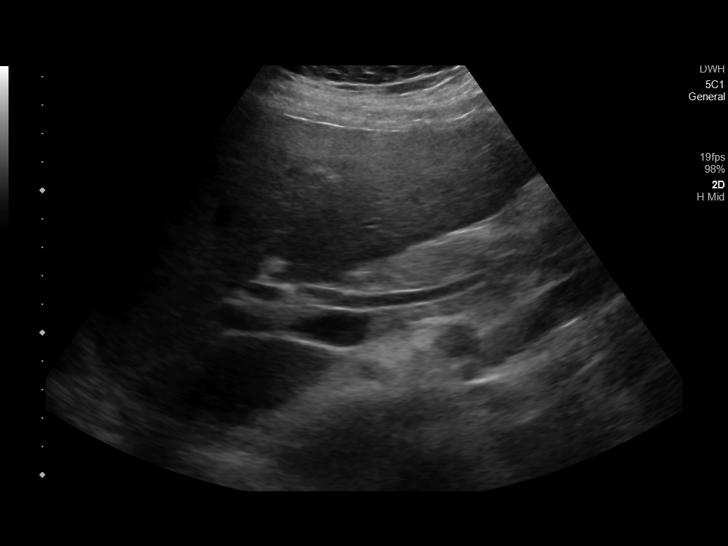

[14 of 25 positions shown; findings below may reference images not displayed]

FINDINGS: Gallbladder:

Absent

Common bile duct:

Diameter: 3-4 mm distally within the pancreatic head

Liver:

No focal lesion identified. Within normal limits in parenchymal
echogenicity. Portal vein is patent on color Doppler imaging with
normal direction of blood flow towards the liver.

Other: None.
IMPRESSION: Status post cholecystectomy.  Otherwise unremarkable examination

## 2022-10-04 ENCOUNTER — Telehealth: Payer: Self-pay | Admitting: Urgent Care

## 2022-10-04 DIAGNOSIS — N3000 Acute cystitis without hematuria: Secondary | ICD-10-CM

## 2022-10-04 MED ORDER — CEPHALEXIN 500 MG PO CAPS: 500.0000 mg | ORAL_CAPSULE | Freq: Two times a day (BID) | ORAL | 0 refills | Status: AC

## 2022-10-04 NOTE — Progress Notes (Signed)

## 2022-10-21 ENCOUNTER — Ambulatory Visit (INDEPENDENT_AMBULATORY_CARE_PROVIDER_SITE_OTHER): Payer: Medicaid Other | Admitting: Psychiatry

## 2022-10-21 ENCOUNTER — Encounter: Payer: Self-pay | Admitting: Psychiatry

## 2022-10-21 VITALS — BP 122/85 | HR 81 | Temp 98.5°F | Ht 63.0 in | Wt 219.0 lb

## 2022-10-21 DIAGNOSIS — F17200 Nicotine dependence, unspecified, uncomplicated: Secondary | ICD-10-CM | POA: Diagnosis not present

## 2022-10-21 DIAGNOSIS — F1421 Cocaine dependence, in remission: Secondary | ICD-10-CM

## 2022-10-21 DIAGNOSIS — F3177 Bipolar disorder, in partial remission, most recent episode mixed: Secondary | ICD-10-CM | POA: Diagnosis not present

## 2022-10-21 DIAGNOSIS — F4010 Social phobia, unspecified: Secondary | ICD-10-CM | POA: Diagnosis not present

## 2022-10-21 DIAGNOSIS — F1211 Cannabis abuse, in remission: Secondary | ICD-10-CM

## 2022-10-21 DIAGNOSIS — F3161 Bipolar disorder, current episode mixed, mild: Secondary | ICD-10-CM

## 2022-10-21 MED ORDER — PAROXETINE HCL 20 MG PO TABS: 10.0000 mg | ORAL_TABLET | ORAL | 1 refills | Status: DC

## 2022-10-21 MED ORDER — BUPROPION HCL 75 MG PO TABS: 75.0000 mg | ORAL_TABLET | Freq: Every morning | ORAL | 0 refills | Status: DC

## 2022-10-21 NOTE — Patient Instructions (Signed)
Paroxetine Tablets What is this medication? PAROXETINE (pa ROX e teen) treats depression, anxiety, obsessive-compulsive disorder (OCD), post-traumatic stress disorder (PTSD), and premenstrual dysphoric disorder (PMDD). It increases the amount of serotonin in the brain, a hormone that helps regulate mood. It belongs to a group of medications called SSRIs. This medicine may be used for other purposes; ask your health care provider or pharmacist if you have questions. COMMON BRAND NAME(S): Paxil, Pexeva What should I tell my care team before I take this medication? They need to know if you have any of these conditions: Bipolar disorder or a family history of bipolar disorder Bleeding disorders Glaucoma Heart disease Kidney disease Liver disease Low levels of sodium in the blood Seizures Suicidal thoughts, plans, or attempt; a previous suicide attempt by you or a family member Take MAOIs like Carbex, Eldepryl, Marplan, Nardil, and Parnate Take medications that treat or prevent blood clots Thyroid disease An unusual or allergic reaction to paroxetine, other medications, foods, dyes, or preservatives Pregnant or trying to get pregnant Breast-feeding How should I use this medication? Take this medication by mouth with a glass of water. Follow the directions on the prescription label. You can take it with or without food. Take your medication at regular intervals. Do not take your medication more often than directed. Do not stop taking this medication suddenly except upon the advice of your care team. Stopping this medication too quickly may cause serious side effects or your condition may worsen. A special MedGuide will be given to you by the pharmacist with each prescription and refill. Be sure to read this information carefully each time. Talk to your care team regarding the use of this medication in children. Special care may be needed. Overdosage: If you think you have taken too much of this  medicine contact a poison control center or emergency room at once. NOTE: This medicine is only for you. Do not share this medicine with others. What if I miss a dose? If you miss a dose, take it as soon as you can. If it is almost time for your next dose, take only that dose. Do not take double or extra doses. What may interact with this medication? Do not take this medication with any of the following: Linezolid MAOIs like Carbex, Eldepryl, Marplan, Nardil, and Parnate Methylene blue (injected into a vein) Pimozide Thioridazine This medication may also interact with the following: Alcohol Amphetamines Aspirin and aspirin-like medications Atomoxetine Certain medications for depression, anxiety, or psychotic disturbances Certain medications for irregular heart beat like propafenone, flecainide, encainide, and quinidine Certain medications for migraine headache like almotriptan, eletriptan, frovatriptan, naratriptan, rizatriptan, sumatriptan, zolmitriptan Cimetidine Digoxin Diuretics Fentanyl Fosamprenavir Furazolidone Isoniazid Lithium Medications that treat or prevent blood clots like warfarin, enoxaparin, and dalteparin Medications for sleep NSAIDs, medications for pain and inflammation, like ibuprofen or naproxen Phenobarbital Phenytoin Procarbazine Rasagiline Ritonavir Supplements like St. John's wort, kava kava, valerian Tamoxifen Tramadol Tryptophan This list may not describe all possible interactions. Give your health care provider a list of all the medicines, herbs, non-prescription drugs, or dietary supplements you use. Also tell them if you smoke, drink alcohol, or use illegal drugs. Some items may interact with your medicine. What should I watch for while using this medication? Tell your care team if your symptoms do not get better or if they get worse. Visit your care team for regular checks on your progress. Because it may take several weeks to see the full  effects of this medication, it is important  to continue your treatment as prescribed by your care team. Watch for new or worsening thoughts of suicide or depression. This includes sudden changes in mood, behaviors, or thoughts. These changes can happen at any time but are more common in the beginning of treatment or after a change in dose. Call your care team right away if you experience these thoughts or worsening depression. Manic episodes may happen in patients with bipolar disorder who take this medication. Watch for changes in feelings or behaviors such as feeling anxious, nervous, agitated, panicky, irritable, hostile, aggressive, impulsive, severely restless, overly excited and hyperactive, or trouble sleeping. These changes can happen at any time but are more common in the beginning of treatment or after a change in dose. Call your care team right away if you notice any of these symptoms. You may get drowsy or dizzy. Do not drive, use machinery, or do anything that needs mental alertness until you know how this medication affects you. Do not stand or sit up quickly, especially if you are an older patient. This reduces the risk of dizzy or fainting spells. Alcohol may interfere with the effect of this medication. Avoid alcoholic drinks. Your mouth may get dry. Chewing sugarless gum or sucking hard candy, and drinking plenty of water will help. Contact your care team if the problem does not go away or is severe. What side effects may I notice from receiving this medication? Side effects that you should report to your care team as soon as possible: Allergic reactions--skin rash, itching, hives, swelling of the face, lips, tongue, or throat Bleeding--bloody or black, tar-like stools, red or dark brown urine, vomiting blood or brown material that looks like coffee grounds, small, red or purple spots on skin, unusual bleeding or bruising Heart rhythm changes--fast or irregular heartbeat, dizziness,  feeling faint or lightheaded, chest pain, trouble breathing Low sodium level--muscle weakness, fatigue, dizziness, headache, confusion Serotonin syndrome--irritability, confusion, fast or irregular heartbeat, muscle stiffness, twitching muscles, sweating, high fever, seizures, chills, vomiting, diarrhea Sudden eye pain or change in vision such as blurry vision, seeing halos around lights, vision loss Thoughts of suicide or self-harm, worsening mood, feelings of depression Side effects that usually do not require medical attention (report to your care team if they continue or are bothersome): Change in sex drive or performance Diarrhea Excessive sweating Nausea Tremors or shaking Upset stomach This list may not describe all possible side effects. Call your doctor for medical advice about side effects. You may report side effects to FDA at 1-800-FDA-1088. Where should I keep my medication? Keep out of the reach of children and pets. Store at room temperature between 15 and 30 degrees C (59 and 86 degrees F). Keep container tightly closed. Throw away any unused medication after the expiration date. NOTE: This sheet is a summary. It may not cover all possible information. If you have questions about this medicine, talk to your doctor, pharmacist, or health care provider.  2023 Elsevier/Gold Standard (2020-08-07 00:00:00)

## 2022-10-21 NOTE — Progress Notes (Unsigned)
[place] MD OP Progress Note  10/21/2022 2:09 PM Terri [name]  MRN:  [id number]  Chief Complaint:  Chief Complaint  Patient presents with   Follow-up   Anxiety   Medication Refill   HPI: Terri [name] is a 27 year old Caucasian female, lives with her fianc in [place], has a history of bipolar disorder, social anxiety disorder, tobacco use disorder, cocaine and cannabis abuse in remission, pseudotumor cerebri per report, was evaluated in the office today.  Patient today reports since being on the Lamictal she has noticed that her depression has improved.  Patient however reports she continues to struggle with anxiety especially in social situations.  She is unable to be in group situations especially when she goes into a store that there is a lot of people that does make her anxious.  Patient also reports recent anxiety due to her situational stressors.  She does report having trouble concentrating especially at work.  She reports she has a 27-year-old baby and hence she is unable to sleep through the night.  She wonders whether she keeps herself up since she is worried about her baby.  She is not interested in sleep medications.  Patient denies any suicidality, homicidality or perceptual disturbances.  Reports she is motivated to stay in therapy.  Aware that her previous therapist left the practice however increased to schedule an appointment with a new therapist.  Patient denies any other concerns today.  Visit Diagnosis:    ICD-10-CM   1. Bipolar 1 disorder, mixed, mild (HCC)  F31.61 buPROPion (WELLBUTRIN) 75 MG tablet    2. Social anxiety disorder  F40.10 PARoxetine (PAXIL) 20 MG tablet    3. Tobacco use disorder  F17.200     4. Cocaine use disorder, moderate, in sustained remission (HCC)  F14.21     5. Cannabis use disorder, mild, in sustained remission  F12.11       Past Psychiatric History: Reviewed past psychiatric History from progress note on 07/07/2022.  Past  Medical History:  Past Medical History:  Diagnosis Date   Anxiety    No pertinent past medical history    Pseudotumor cerebri     Past Surgical History:  Procedure Laterality Date   CHOLECYSTECTOMY N/A 11/04/2018   Procedure: LAPAROSCOPIC CHOLECYSTECTOMY;  Surgeon: Olean Ree, MD;  Location: [facility] ORS;  Service: General;  Laterality: N/A;   DILATION AND EVACUATION N/A 07/13/2017   Procedure: DILATATION AND EVACUATION;  Surgeon: Ward, Honor Loh, MD;  Location: [facility] ORS;  Service: Gynecology;  Laterality: N/A;   ERCP N/A 11/14/2018   Procedure: ENDOSCOPIC RETROGRADE CHOLANGIOPANCREATOGRAPHY (ERCP);  Surgeon: Lucilla Lame, MD;  Location: [facility] ENDOSCOPY;  Service: Endoscopy;  Laterality: N/A;    Family Psychiatric History: Reviewed family psychiatric history from progress note on 07/07/2022.  Family History:  Family History  Problem Relation Age of Onset   Drug abuse Mother    Arthritis Mother    Thyroid disease Mother    Depression Mother    Anxiety disorder Mother    Suicidality Mother    ALS Maternal Grandfather    Anxiety disorder Maternal Grandmother    Depression Maternal Grandmother    COPD Maternal Grandmother    ADD / ADHD Son    Breast cancer Neg Hx    Colon cancer Neg Hx     Social History: Reviewed social history from progress note on 07/07/2022. Social History   Socioeconomic History   Marital status: Single    Spouse name: Not on file   Number  of children: 3   Years of education: Not on file   Highest education level: 12th grade  Occupational History   Occupation: SAHM  Tobacco Use   Smoking status: Every Day    Packs/day: 0.50    Years: 8.00    Total pack years: 4.00    Types: Cigarettes   Smokeless tobacco: Never  Vaping Use   Vaping Use: Never used  Substance and Sexual Activity   Alcohol use: Yes    Comment: social   Drug use: Yes    Types: Marijuana, Cocaine   Sexual activity: Yes    Partners: Male    Birth control/protection: I.U.D.  Other  Topics Concern   Not on file  Social History Narrative   Not on file   Social Determinants of Health   Financial Resource Strain: Not on file  Food Insecurity: Not on file  Transportation Needs: Not on file  Physical Activity: Not on file  Stress: Not on file  Social Connections: Not on file    Allergies:  Allergies  Allergen Reactions   Sulfa Antibiotics Other (See Comments)    Reaction: unknown.  Told allergic as child but believes she's taken since.   Zithromax [Azithromycin] Other (See Comments)    Reaction: unknown Told reaction as a child    Metabolic Disorder Labs: Lab Results  Component Value Date   HGBA1C 5.3 06/10/2021   No results found for: "PROLACTIN" No results found for: "CHOL", "TRIG", "HDL", "CHOLHDL", "VLDL", "LDLCALC" Lab Results  Component Value Date   TSH 1.795 07/07/2022   TSH 0.995 01/17/2021    Therapeutic Level Labs: No results found for: "LITHIUM" No results found for: "VALPROATE" No results found for: "CBMZ"  Current Medications: Current Outpatient Medications  Medication Sig Dispense Refill   acetaminophen (TYLENOL) 325 MG tablet Take 650 mg by mouth every 6 (six) hours as needed for moderate pain or headache.     buPROPion (WELLBUTRIN) 75 MG tablet Take 1 tablet (75 mg total) by mouth in the morning. 10 tablet 0   lamoTRIgine (LAMICTAL) 25 MG tablet Take 3 tablets (75 mg total) by mouth as directed. Take 2 tablets daily morning and 1 tablet daily in the evening. 90 tablet 1   levonorgestrel (KYLEENA) 19.5 MG IUD 1 each by Intrauterine route once.     PARoxetine (PAXIL) 20 MG tablet Take 0.5-1 tablets (10-20 mg total) by mouth as directed. Take 10 mg daily for 15 days and increase to 20 mg daily after that 30 tablet 1   No current facility-administered medications for this visit.     Musculoskeletal: Strength & Muscle Tone: within normal limits Gait & Station: normal Patient leans: N/A  Psychiatric Specialty Exam: Review of  Systems  Psychiatric/Behavioral:  Positive for decreased concentration and sleep disturbance. The patient is nervous/anxious.   All other systems reviewed and are negative.   Blood pressure 122/85, pulse 81, temperature 98.5 F (36.9 C), temperature source Skin, height 5' 3"$  (1.6 m), weight 219 lb (99.3 kg), not currently breastfeeding.Body mass index is 38.79 kg/m.  General Appearance: Casual  Eye Contact:  Fair  Speech:  Clear and Coherent  Volume:  Normal  Mood:  Anxious  Affect:  Congruent  Thought Process:  Goal Directed and Descriptions of Associations: Intact  Orientation:  Full (Time, Place, and Person)  Thought Content: Logical   Suicidal Thoughts:  No  Homicidal Thoughts:  No  Memory:  Immediate;   Fair Recent;   Fair Remote;   Fair  Judgement:  Fair  Insight:  Fair  Psychomotor Activity:  Normal  Concentration:  Concentration: Fair and Attention Span: Fair  Recall:  [employer] of Knowledge: Fair  Language: Fair  Akathisia:  No  Handed:  Right  AIMS (if indicated): not done  Assets:  Communication Skills Desire for Improvement Housing Social Support  ADL's:  Intact  Cognition: WNL  Sleep:  Poor   Screenings: GAD-7    Physiological scientist Office Visit from 09/02/2022 in [place] Office Visit from 07/07/2022 in [place] Video Visit from 05/05/2022 in [place] Office Visit from 08/05/2021 in [facility] Video Visit from 07/30/2021 in [facility]  Total GAD-7 Score 6 11 16 9 12      $ PHQ2-9    Flowsheet Row Counselor from 09/23/2022 in [place] Office Visit from 09/02/2022 in [place] Office Visit from 07/07/2022 in [place] Video Visit from 03/18/2022 in [place] Office Visit from  10/21/2021 in [place]  PHQ-2 Total Score 1 0 1 2 6  $ PHQ-9 Total Score 6 8 9 10 25      $ Flowsheet Row Counselor from 09/23/2022 in [place] Office Visit from 09/02/2022 in [place] ED from 08/28/2022 in [facility] Emergency Department at [place] No Risk No Risk No Risk        Assessment and Plan: FAHMIDA MERGEL is a 27 year old Caucasian female, lives with her fianc, has a history of bipolar disorder, social anxiety, was evaluated in the office today.  Patient continues to struggle with anxiety although depression, mood swings getting better.  Plan as noted below.  Plan Bipolar disorder type I mixed mild in partial remission Lamictal 75 mg p.o. daily Patient to continue to work on sleep hygiene techniques.  Not interested in sleep medications and she has a 33-year-old baby.   Social anxiety disorder-unstable Patient to establish care with a new therapist.  Previously was seeing [facility] Start Paxil 10 mg p.o. daily for 15 days and increased to 20 mg p.o. daily after that at supper. Reduce Wellbutrin 75 mg p.o. daily for 10 days and stop taking it. Discontinue hydroxyzine for side effects, noncompliance.  Tobacco use disorder-unstable Will monitor closely  Cocaine use disorder in remission/cannabis use disorder in remission Will monitor closely  Follow-up in clinic in 4 to 6 weeks or sooner if needed.  This note was generated in part or whole with voice recognition software. Voice recognition is usually quite accurate but there are transcription errors that can and very often do occur. I apologize for any typographical errors that were not detected and corrected.      Ursula Alert, MD 10/21/2022, 2:09 PM

## 2022-10-26 ENCOUNTER — Telehealth: Payer: Medicaid Other | Admitting: Nurse Practitioner

## 2022-10-26 DIAGNOSIS — K047 Periapical abscess without sinus: Secondary | ICD-10-CM | POA: Diagnosis not present

## 2022-10-26 MED ORDER — AMOXICILLIN-POT CLAVULANATE 875-125 MG PO TABS: 1.0000 | ORAL_TABLET | Freq: Two times a day (BID) | ORAL | 0 refills | Status: AC

## 2022-10-26 NOTE — Progress Notes (Signed)
E-Visit for Dental Pain  We are sorry that you are not feeling well.  Here is how we plan to help!  Based on what you have shared with me in the questionnaire, it sounds like you have an infected tooth  Augmentin 875-'125mg'$  twice a day for 7 days You can continue to use ibuprofen '800mg'$  up to every 8 hours as needed for pain, assure you are taking that with food as well   It is imperative that you see a dentist within 10 days of this eVisit to determine the cause of the dental pain and be sure it is adequately treated  A toothache or tooth pain is caused when the nerve in the root of a tooth or surrounding a tooth is irritated. Dental (tooth) infection, decay, injury, or loss of a tooth are the most common causes of dental pain. Pain may also occur after an extraction (tooth is pulled out). Pain sometimes originates from other areas and radiates to the jaw, thus appearing to be tooth pain.Bacteria growing inside your mouth can contribute to gum disease and dental decay, both of which can cause pain. A toothache occurs from inflammation of the central portion of the tooth called pulp. The pulp contains nerve endings that are very sensitive to pain. Inflammation to the pulp or pulpitis may be caused by dental cavities, trauma, and infection.    HOME CARE:   For toothaches: Over-the-counter pain medications such as acetaminophen or ibuprofen may be used. Take these as directed on the package while you arrange for a dental appointment. Avoid very cold or hot foods, because they may make the pain worse. You may get relief from biting on a cotton ball soaked in oil of cloves. You can get oil of cloves at most drug stores.  For jaw pain:  Aspirin may be helpful for problems in the joint of the jaw in adults. If pain happens every time you open your mouth widely, the temporomandibular joint (TMJ) may be the source of the pain. Yawning or taking a large bite of food may worsen the pain. An appointment  with your doctor or dentist will help you find the cause.     GET HELP RIGHT AWAY IF:  You have a high fever or chills If you have had a recent head or face injury and develop headache, light headedness, nausea, vomiting, or other symptoms that concern you after an injury to your face or mouth, you could have a more serious injury in addition to your dental injury. A facial rash associated with a toothache: This condition may improve with medication. Contact your doctor for them to decide what is appropriate. Any jaw pain occurring with chest pain: Although jaw pain is most commonly caused by dental disease, it is sometimes referred pain from other areas. People with heart disease, especially people who have had stents placed, people with diabetes, or those who have had heart surgery may have jaw pain as a symptom of heart attack or angina. If your jaw or tooth pain is associated with lightheadedness, sweating, or shortness of breath, you should see a doctor as soon as possible. Trouble swallowing or excessive pain or bleeding from gums: If you have a history of a weakened immune system, diabetes, or steroid use, you may be more susceptible to infections. Infections can often be more severe and extensive or caused by unusual organisms. Dental and gum infections in people with these conditions may require more aggressive treatment. An abscess may need draining  or IV antibiotics, for example.  MAKE SURE YOU   Understand these instructions. Will watch your condition. Will get help right away if you are not doing well or get worse.  Thank you for choosing an e-visit.  Your e-visit answers were reviewed by a board certified advanced clinical practitioner to complete your personal care plan. Depending upon the condition, your plan could have included both over the counter or prescription medications.  Please review your pharmacy choice. Make sure the pharmacy is open so you can pick up prescription  now. If there is a problem, you may contact your provider through [employer] and have the prescription routed to another pharmacy.  Your safety is important to [place]. If you have drug allergies check your prescription carefully.   For the next 24 hours you can use MyChart to ask questions about today's visit, request a non-urgent call back, or ask for a work or school excuse. You will get an email in the next two days asking about your experience. I hope that your e-visit has been valuable and will speed your recovery.   Meds ordered this encounter  Medications   amoxicillin-clavulanate (AUGMENTIN) 875-125 MG tablet    Sig: Take 1 tablet by mouth 2 (two) times daily for 7 days. Take with food    Dispense:  14 tablet    Refill:  0

## 2022-10-28 ENCOUNTER — Telehealth: Payer: Self-pay | Admitting: Licensed Clinical Social Worker

## 2022-10-28 ENCOUNTER — Ambulatory Visit (INDEPENDENT_AMBULATORY_CARE_PROVIDER_SITE_OTHER): Payer: Medicaid Other | Admitting: Licensed Clinical Social Worker

## 2022-10-28 DIAGNOSIS — Z91199 Patient's noncompliance with other medical treatment and regimen due to unspecified reason: Secondary | ICD-10-CM

## 2022-10-28 NOTE — Telephone Encounter (Signed)
LCSW called pt at 10:05 on 2/28 to inquire about missed appt. Pt answered the phone and reported that she had to admit her sister into the hospital and forgot appt. Pt and LCSW assisted pt in getting rescheduled for 3/7 at 8:00AM for a video call. Pt reports that video calls are a preference going forward.

## 2022-10-28 NOTE — Progress Notes (Signed)
LCSW called pt at 10:05 on 2/28 to inquire about appt. Pt answered the phone and reported that she had to admit her sister into the hospital and forgot appt. Pt and LCSW assisted pt in getting rescheduled for 3/7 at 8:00AM for a video call. Pt reports that video calls are a preference going forward.

## 2022-11-05 ENCOUNTER — Telehealth: Payer: Medicaid Other | Admitting: Licensed Clinical Social Worker

## 2022-11-05 ENCOUNTER — Encounter: Payer: Self-pay | Admitting: Licensed Clinical Social Worker

## 2022-11-11 NOTE — Progress Notes (Signed)
I spent approximately 5 minutes reviewing the patient's history, current symptoms and coordinating their care today.

## 2022-11-12 ENCOUNTER — Other Ambulatory Visit: Payer: Self-pay | Admitting: Psychiatry

## 2022-11-12 DIAGNOSIS — F3161 Bipolar disorder, current episode mixed, mild: Secondary | ICD-10-CM

## 2022-11-12 DIAGNOSIS — F4010 Social phobia, unspecified: Secondary | ICD-10-CM

## 2022-11-23 ENCOUNTER — Telehealth (INDEPENDENT_AMBULATORY_CARE_PROVIDER_SITE_OTHER): Payer: Medicaid Other | Admitting: Psychiatry

## 2022-11-23 DIAGNOSIS — F3177 Bipolar disorder, in partial remission, most recent episode mixed: Secondary | ICD-10-CM

## 2022-11-23 NOTE — Progress Notes (Signed)
No response to call or text or video invite  

## 2022-11-25 ENCOUNTER — Encounter (INDEPENDENT_AMBULATORY_CARE_PROVIDER_SITE_OTHER): Payer: Self-pay

## 2022-11-26 ENCOUNTER — Telehealth (INDEPENDENT_AMBULATORY_CARE_PROVIDER_SITE_OTHER): Payer: Medicaid Other | Admitting: Licensed Clinical Social Worker

## 2022-11-26 DIAGNOSIS — F3177 Bipolar disorder, in partial remission, most recent episode mixed: Secondary | ICD-10-CM | POA: Diagnosis not present

## 2022-11-26 DIAGNOSIS — F4010 Social phobia, unspecified: Secondary | ICD-10-CM | POA: Diagnosis not present

## 2022-12-01 NOTE — Progress Notes (Signed)
THERAPIST PROGRESS NOTE  Session Time: 1:00PM-1:21PM  Participation Level: Active  Behavioral Response: Casual, Neat, and Well GroomedAlertAnxious and Euthymic  Type of Therapy: Individual Therapy  Treatment Goals addressed:  Reduce overall frequency, intensity and duration of anxiety so that daily functioning is not impaired per pt self report 3 out of 5 sessions.   Alleviate depressive/manic symptoms and return to improved levels of effective functioning per pt self report 3 out of 5 sessions.   ProgressTowards Goals: Progressing  Interventions: CBT, DBT, Motivational Interviewing, Solution Focused, Strength-based, and Other: ACT  Virtual Visit via Video Note  I connected with Terri [name] on 12/01/22 at  1:00 PM EDT by a video enabled telemedicine application and verified that I am speaking with the correct person using two identifiers.  Location: Patient: located in pt home Provider: remote in [place], [place]   I discussed the limitations of evaluation and management by telemedicine and the availability of in person appointments. The patient expressed understanding and agreed to proceed.  I discussed the assessment and treatment plan with the patient. The patient was provided an opportunity to ask questions and all were answered. The patient agreed with the plan and demonstrated an understanding of the instructions.   The patient was advised to call back or seek an in-person evaluation if the symptoms worsen or if the condition fails to improve as anticipated.  I provided 21 minutes of non-face-to-face time during this encounter.   Blair Dolphin, LCSW   Summary: Terri [name] is a 27 y.o. female who presents with mixed sxs of anxiety and bipolar disorder. Sxs endorsed including but not limited to worry, difficulty controlling worry, mood fluctuations, irritability, impulsivity, and irregular sleep patters. Pt oriented to person, place, and time. Pt denies SI/HI  or A/V hallucinations. Pt was cooperative during visit and was engaged throughout the visit. Pt does not report any other concerns at the time of visit.  Pt provided hx of social anxiety and identified goals for therapy to be overcoming anxious sxs especially in social settings and managing mood fluctuations. Pt reported not enjoying being perceived. Explored pt trauma hx to assist pt in understanding safety of not being perceived.   Pt shared that she has recently been experiencing tension pains in her neck during panic attacks and moments of extreme stress. Pt acknowledged connection between physical and mental health.   Pt reviewed tx plan and confirmed that goals continue to be applicable to pt.   LCSW provided mood monitoring and treatment progress review in the context of this episode of treatment. LCSW reviewed the pt's mood status since last session.   Pt is continuing to apply interventions/techniques learned in session into daily life situations. Pt is currently on track to meet goals utilizing interventions that are discussed in session. Treatment to continue as indicated. Personal growth and progress toward goals noted above.  Continued Recommendations as followed: Self-care behaviors, positive social engagements, focusing on positive physical and emotional wellness, and focusing on life/work balance.    Suicidal/Homicidal: Nowithout intent/plan  Therapist Response:  Provided pt education re: acceptance. Discussed how to make acceptance accessible at all parts of pt's healing journey.   Approached pt with strengths based perspective to assist pt in exploring strengths in moments of feeling low.   LCSW practiced active listening to validate pt participation, build rapport, and create safe space for pt to feel heard as they are disclosing their thoughts and feelings.   LCSW utilized therapeutic conversation skills informed  by CBT, DBT, and ACT to expose pt to multiple ways of  thinking about healing and to provide pt to access to multiple interventions.  LCSW introduced pt to Acceptance and Commitment Therapy. Pt engaged in discussion on how to explore what they must accept in order to commit to what they have identified as important. Introduced pt to values directed goal exploration in order to identify goals of importance.   Introduced pt to Dialectical Behavior Therapy and the importance of acceptance and change. Discussed concept of radical acceptance and also assisted pt in learning barriers to engaging in radical acceptance in journey towards change. Discussed importance of leaning into the dialectic and taught pt "and also" statements to assist in engaging with the bothness of change.   Plan: Return again in 3 weeks.  Diagnosis: Bipolar disorder, in partial remission, most recent episode mixed ([place])  Social anxiety disorder    10/21/2022    2:10 PM 09/23/2022    4:57 PM 09/02/2022    2:08 PM 07/07/2022    1:25 PM  GAD 7 : Generalized Anxiety Score  Nervous, Anxious, on Edge 2 3 1 3   Control/stop worrying 1 1 0 1  Worry too much - different things 1 1 1  0  Trouble relaxing 2  0 2  Restless 1 0 0 2  Easily annoyed or irritable 3 3 3 3   Afraid - awful might happen 0 0 1 0  Total GAD 7 Score 10  6 11   Anxiety Difficulty Very difficult Somewhat difficult Somewhat difficult Somewhat difficult       10/21/2022    2:09 PM 09/23/2022    4:56 PM 09/02/2022    2:07 PM  Depression screen PHQ 2/9  Decreased Interest 0 1 0  Down, Depressed, Hopeless 1 0 0  PHQ - 2 Score 1 1 0  Altered sleeping 3 3 2   Tired, decreased energy 1 0 3  Change in appetite 0 0 0  Feeling bad or failure about yourself  0 0 0  Trouble concentrating 3 2 3   Moving slowly or fidgety/restless 0 0 0  Suicidal thoughts 0 0 0  PHQ-9 Score 8 6 8   Difficult doing work/chores Not difficult at all      Collaboration of Care: Psychiatrist AEB Dr. Shea Evans  Patient/Guardian was advised Release  of Information must be obtained prior to any record release in order to collaborate their care with an outside provider. Patient/Guardian was advised if they have not already done so to contact the registration department to sign all necessary forms in order for [place] to release information regarding their care.   Consent: Patient/Guardian gives verbal consent for treatment and assignment of benefits for services provided during this visit. Patient/Guardian expressed understanding and agreed to proceed.   Blair Dolphin, LCSW 12/01/2022

## 2022-12-07 ENCOUNTER — Ambulatory Visit: Payer: Medicaid Other | Admitting: Physician Assistant

## 2022-12-07 VITALS — BP 115/91 | HR 85 | Ht 63.0 in | Wt 222.9 lb

## 2022-12-07 DIAGNOSIS — R590 Localized enlarged lymph nodes: Secondary | ICD-10-CM

## 2022-12-07 DIAGNOSIS — M542 Cervicalgia: Secondary | ICD-10-CM

## 2022-12-07 MED ORDER — METHYLPREDNISOLONE 4 MG PO TBPK: ORAL_TABLET | ORAL | 0 refills | Status: DC

## 2022-12-07 MED ORDER — CYCLOBENZAPRINE HCL 5 MG PO TABS: 5.0000 mg | ORAL_TABLET | Freq: Three times a day (TID) | ORAL | 1 refills | Status: DC | PRN

## 2022-12-07 NOTE — Progress Notes (Signed)
I,Sha'taria Tyson,acting as a Neurosurgeon for [employer], PA-C.,have documented all relevant documentation on the behalf of Alfredia Ferguson, PA-C,as directed by  Alfredia Ferguson, PA-C while in the presence of Alfredia Ferguson, PA-C.   Established patient visit   Patient: Terri [name]   DOB: 10/02/1995   26 y.o. Female  MRN: [id number] Visit Date: 12/07/2022  Today's healthcare provider: Alfredia Ferguson, PA-C   Cc. Headache/neck pain/ lump on neck  Subjective     Reports headache back of neck, back of head x 1 months. Reports it is present every day. She points to the base of her head. When the headache started she was rubbing her neck to help and noticed a bump on the right side of her neck/head. Denies pain in this spot.  Denies radiating pain, numbness, vision changes, lightheadedness. Some associated nausea.  Denies MVA or other trauma.   Medications: Outpatient Medications Prior to Visit  Medication Sig   acetaminophen (TYLENOL) 325 MG tablet Take 650 mg by mouth every 6 (six) hours as needed for moderate pain or headache.   lamoTRIgine (LAMICTAL) 25 MG tablet TAKE 3 TABLETS BY MOUTH AS DIRECTED. TAKE 2 TABLETS MORNING AND 1 TABLET IN THE EVENING   levonorgestrel (KYLEENA) 19.5 MG IUD 1 each by Intrauterine route once.   PARoxetine (PAXIL) 20 MG tablet Take 0.5-1 tablets (10-20 mg total) by mouth as directed. Take 10 mg daily for 15 days and increase to 20 mg daily after that   buPROPion (WELLBUTRIN) 75 MG tablet Take 1 tablet (75 mg total) by mouth in the morning.   No facility-administered medications prior to visit.    Review of Systems  Musculoskeletal:  Positive for neck pain.  Neurological:  Positive for headaches.     Objective    BP (!) 115/91 (BP Location: Right Arm, Patient Position: Sitting, Cuff Size: Large)   Pulse 85   Ht 5\' 3"  (1.6 m)   Wt 222 lb 14.4 oz (101.1 kg)   LMP 11/30/2022   SpO2 97%   Breastfeeding No   BMI 39.48 kg/m    Physical  Exam Vitals reviewed.  Constitutional:      Appearance: She is not ill-appearing.  HENT:     Head: Normocephalic.  Eyes:     Conjunctiva/sclera: Conjunctivae normal.  Neck:     Comments: Some tension R side of neck. No visible lesions or rashes. R occiput/almost post auricular there is a ~1 cm soft mobile mass.  Pt denies worsening neck pain with any upper extremity/shoulder movement.  Cardiovascular:     Rate and Rhythm: Normal rate.  Pulmonary:     Effort: Pulmonary effort is normal. No respiratory distress.  Neurological:     General: No focal deficit present.     Mental Status: She is alert and oriented to person, place, and time.  Psychiatric:        Mood and Affect: Mood normal.        Behavior: Behavior normal.      No results found for any visits on 12/07/22.  Assessment & Plan     Neck pain 2. Cervical adenopathy  Will treat with medrol dose pack, cyclobenzaprine 5 mg tid.  Advised okay to take ibuprofen 600 mg q 6 hours prn the first few days. Tylneol is ok too.  If no changes please f/u in office. Any fevers f/u in office.  Small shotty LN appreciated. D/t odd placement, will confirm with ultrasound  Return if symptoms worsen  or fail to improve.      I, Alfredia Ferguson, PA-C have reviewed all documentation for this visit. The documentation on 12/07/22 for the exam, diagnosis, procedures, and orders are all accurate and complete.  Alfredia Ferguson, PA-C [facility] [address] [place], [place], [address] Office: [phone] Fax: [phone]   [facility]

## 2022-12-14 ENCOUNTER — Ambulatory Visit: Payer: Self-pay

## 2022-12-14 ENCOUNTER — Telehealth: Payer: Medicaid Other | Admitting: Physician Assistant

## 2022-12-14 DIAGNOSIS — M542 Cervicalgia: Secondary | ICD-10-CM | POA: Diagnosis not present

## 2022-12-14 MED ORDER — PREDNISONE 10 MG PO TABS: ORAL_TABLET | ORAL | 0 refills | Status: AC

## 2022-12-14 NOTE — Patient Instructions (Signed)
Terri Doyne Feutz, thank you for joining [name], PA-C for today's virtual visit.  While this provider is not your primary care provider (PCP), if your PCP is located in our provider database this encounter information will be shared with them immediately following your visit.   A [facility] MyChart account gives you access to today's visit and all your visits, tests, and labs performed at [facility] " click here if you don't have a [facility] MyChart account or go to [email or web address]  Consent: (Patient) Terri [name] provided verbal consent for this virtual visit at the beginning of the encounter.  Current Medications:  Current Outpatient Medications:    predniSONE (DELTASONE) 10 MG tablet, Take 4 tablets (40 mg total) by mouth daily with breakfast for 2 days, THEN 3 tablets (30 mg total) daily with breakfast for 4 days, THEN 2 tablets (20 mg total) daily with breakfast for 2 days, THEN 1 tablet (10 mg total) daily with breakfast for 2 days., Disp: 26 tablet, Rfl: 0   acetaminophen (TYLENOL) 325 MG tablet, Take 650 mg by mouth every 6 (six) hours as needed for moderate pain or headache., Disp: , Rfl:    buPROPion (WELLBUTRIN) 75 MG tablet, Take 1 tablet (75 mg total) by mouth in the morning., Disp: 10 tablet, Rfl: 0   cyclobenzaprine (FLEXERIL) 5 MG tablet, Take 1 tablet (5 mg total) by mouth 3 (three) times daily as needed for muscle spasms., Disp: 30 tablet, Rfl: 1   lamoTRIgine (LAMICTAL) 25 MG tablet, TAKE 3 TABLETS BY MOUTH AS DIRECTED. TAKE 2 TABLETS MORNING AND 1 TABLET IN THE EVENING, Disp: 90 tablet, Rfl: 0   levonorgestrel (KYLEENA) 19.5 MG IUD, 1 each by Intrauterine route once., Disp: , Rfl:    PARoxetine (PAXIL) 20 MG tablet, Take 0.5-1 tablets (10-20 mg total) by mouth as directed. Take 10 mg daily for 15 days and increase to 20 mg daily after that, Disp: 30 tablet, Rfl: 1   Medications ordered in this encounter:  Meds ordered this  encounter  Medications   predniSONE (DELTASONE) 10 MG tablet    Sig: Take 4 tablets (40 mg total) by mouth daily with breakfast for 2 days, THEN 3 tablets (30 mg total) daily with breakfast for 4 days, THEN 2 tablets (20 mg total) daily with breakfast for 2 days, THEN 1 tablet (10 mg total) daily with breakfast for 2 days.    Dispense:  26 tablet    Refill:  0    Order Specific Question:   Supervising Provider    Answer:   Merrilee Jansky [id number]     *If you need refills on other medications prior to your next appointment, please contact your pharmacy*  Follow-Up: Call back or seek an in-person evaluation if the symptoms worsen or if the condition fails to improve as anticipated.  [facility] [phone]  Other Instructions   If you have been instructed to have an in-person evaluation today at a local Urgent Care facility, please use the link below. It will take you to a list of all of our available [facility], including address, phone number and hours of operation. Please do not delay care.  [facility]  If you or a family member do not have a primary care provider, use the link below to schedule a visit and establish care. When you choose a  primary care physician or advanced practice provider, you gain a long-term partner  in health. Find a Primary Care Provider  Learn more about 's in-office and virtual care options: [facility]

## 2022-12-14 NOTE — Progress Notes (Signed)
Virtual Visit Consent   Terri [name], you are scheduled for a virtual visit with a [facility] provider today. Just as with appointments in the office, your consent must be obtained to participate. Your consent will be active for this visit and any virtual visit you may have with one of our providers in the next 365 days. If you have a MyChart account, a copy of this consent can be sent to you electronically.  As this is a virtual visit, video technology does not allow for your provider to perform a traditional examination. This may limit your provider's ability to fully assess your condition. If your provider identifies any concerns that need to be evaluated in person or the need to arrange testing (such as labs, EKG, etc.), we will make arrangements to do so. Although advances in technology are sophisticated, we cannot ensure that it will always work on either your end or our end. If the connection with a video visit is poor, the visit may have to be switched to a telephone visit. With either a video or telephone visit, we are not always able to ensure that we have a secure connection.  By engaging in this virtual visit, you consent to the provision of healthcare and authorize for your insurance to be billed (if applicable) for the services provided during this visit. Depending on your insurance coverage, you may receive a charge related to this service.  I need to obtain your verbal consent now. Are you willing to proceed with your visit today? Terri [name] has provided verbal consent on 12/14/2022 for a virtual visit (video or telephone). Terri [name], [place]  Date: 12/14/2022 2:52 PM  Virtual Visit via Video Note   I, Terri [name], connected with  Terri [name]  ([id number], [birth year]) on 12/14/22 at  2:15 PM EDT by a video-enabled telemedicine application and verified that I am speaking with the correct person using two identifiers.  Location: Patient: Virtual  Visit Location Patient: Home Provider: Virtual Visit Location Provider: Home Office   I discussed the limitations of evaluation and management by telemedicine and the availability of in person appointments. The patient expressed understanding and agreed to proceed.    History of Present Illness: Terri [name] is a 27 y.o. who identifies as a female who was assigned female at birth, and is being seen today for increased level of pain in her neck with reductin in her prednisone dose as she is completing course of medication. Was evaluated last week at PCP and thought to have enlarged lymph node with tenderness. No other signs of infection. Was unsure if was actually a lymph node s [place] was ordered (scheduled for tomorrow morning). Was given muscle relaxant which she started first without any improvement, and a steroid pack which she started with notation of substantial improvement. Denies fever, chills. Normal ROM of neck albeit with pain. Denies any redness ror warmth at site of this "lymph node". Contacted her PCP but unable to get in at a time she could be seen today.    HPI: HPI  Problems:  Patient Active Problem List   Diagnosis Date Noted   Social anxiety disorder 07/07/2022   Cocaine use disorder, moderate, in sustained remission 07/07/2022   Cannabis use disorder, mild, in sustained remission 07/07/2022   Obesity during pregnancy, antepartum 06/24/2021   Insufficient prenatal care 06/24/2021   Late prenatal care affecting pregnancy in third trimester 06/24/2021   Marijuana use during pregnancy 06/24/2021  Left hand pain 10/30/2020   Arthralgia of both knees and bilateral hands.  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  Told allergic as child but believes she's taken since.   Zithromax [Azithromycin] Other (See Comments)    Reaction: unknown Told reaction as a child   Medications:  Current Outpatient Medications:    predniSONE (DELTASONE) 10 MG tablet, Take 4 tablets (40 mg total) by mouth daily with breakfast for 2 days, THEN 3 tablets (30 mg total) daily with breakfast for 4 days, THEN 2 tablets (20 mg total) daily with breakfast for 2 days, THEN 1 tablet (10 mg total) daily with breakfast for 2 days., Disp: 26 tablet, Rfl: 0   acetaminophen (TYLENOL) 325 MG tablet, Take 650 mg by mouth every 6 (six) hours as needed for moderate pain or headache., Disp: , Rfl:    buPROPion (WELLBUTRIN) 75 MG tablet, Take 1 tablet (75 mg total) by mouth in the morning., Disp: 10 tablet, Rfl: 0   cyclobenzaprine (FLEXERIL) 5 MG tablet, Take 1 tablet (5 mg total) by mouth 3 (three) times daily as needed for muscle spasms., Disp: 30 tablet, Rfl: 1   lamoTRIgine (LAMICTAL) 25 MG tablet, TAKE 3 TABLETS BY MOUTH AS DIRECTED. TAKE 2 TABLETS MORNING AND 1 TABLET IN THE EVENING, Disp: 90 tablet, Rfl: 0   levonorgestrel (KYLEENA) 19.5 MG IUD, 1 each by Intrauterine route once., Disp: , Rfl:    PARoxetine (PAXIL) 20 MG tablet, Take 0.5-1 tablets (10-20 mg total) by mouth as directed. Take 10 mg daily for 15 days and increase to 20 mg daily after that, Disp: 30 tablet, Rfl: 1  Observations/Objective: Patient is well-developed, well-nourished in no acute distress.  Resting comfortably at home.  Head is normocephalic, atraumatic.  No labored breathing.  Speech is clear and coherent with logical content.  Patient is alert and  oriented at baseline.   Assessment and Plan: 1. Neck pain - predniSONE (DELTASONE) 10 MG tablet; Take 4 tablets (40 mg total) by mouth daily with breakfast for 2 days, THEN 3 tablets (30 mg total) daily with breakfast for 4 days, THEN 2 tablets (20 mg total) daily with breakfast for 2 days, THEN 1 tablet (10 mg total) daily with breakfast for 2 days.  Dispense: 26 tablet; Refill: 0  Scheduled for imaging tomorrow. Will have her stop current dose. Will start prednisone taper -- slower taper to get her through imaging and for her PCP to get results and determine next steps. OTC medications reviewed.   Follow Up Instructions: I discussed the assessment and treatment plan with the patient. The patient was provided an opportunity to ask questions and all were answered. The patient agreed with the plan and demonstrated an understanding of the instructions.  A copy of instructions were sent to the patient via MyChart unless otherwise noted below.   The patient was advised to call back or seek an in-person evaluation if the symptoms worsen or if the condition fails  to improve as anticipated.  Time:  I spent 10 minutes with the patient via telehealth technology discussing the above problems/concerns.    Terri Climes, PA-C

## 2022-12-14 NOTE — Telephone Encounter (Signed)
Chief Complaint: neck pain Symptoms: mass to back of neck, headache, 7/10 pain Frequency: Pain started yesterday and has gotten worse today Pertinent Negatives: Patient denies other symptoms Disposition: [] ED /[] Urgent Care (no appt availability in office) / [] Appointment(In office/virtual)/ [x]  [facility]/ [] Home Care/ [] Refused Recommended Disposition /[] [facility]/ []  Follow-up with PCP Additional Notes: Patient wanting to know if she could be prescribed another round of prednisone. She says she has an ultrasound tomorrow to find out what the growth is on her neck. Advised per last OV note to f/u in office, offered appt today at 1500, but patient says she will not be able to make that due to getting her kids off the bus at that time, offered morning appt, she says she will not have transportation tomorrow, scheduled virtual UC visit today at 1415 after calling to speak to Syl[place]rena, [facility] in the office and advising no other available appts other than the 1500 with Dyke Maes.    Reason for Disposition  [1] MODERATE neck pain (e.g., interferes with normal activities) AND [2] present > 3 days  Answer Assessment - Initial Assessment Questions 1. ONSET: "When did the pain begin?"      Yesterday evening 2. LOCATION: "Where does it hurt?"      Back of the neck on the right side 3. PATTERN "Does the pain come and go, or has it been constant since it started?"      Constant  4. SEVERITY: "How bad is the pain?"  (Scale 1-10; or mild, moderate, severe)   - NO PAIN (0): no pain or only slight stiffness    - MILD (1-3): doesn't interfere with normal activities    - MODERATE (4-7): interferes with normal activities or awakens from sleep    - SEVERE (8-10):  excruciating pain, unable to do any normal activities      7 5. RADIATION: "Does the pain go anywhere else, shoot into your arms?"     Head 6. CORD SYMPTOMS: "Any weakness or numbness of the arms or legs?"     No 7. CAUSE: "What do  you think is causing the neck pain?"     The swelling 8. OTHER SYMPTOMS: "Do you have any other symptoms?" (e.g., headache, fever, chest pain, difficulty breathing, neck swelling)     No  Protocols used: Neck Pain or Stiffness-A-AH

## 2022-12-15 ENCOUNTER — Ambulatory Visit
Admission: RE | Admit: 2022-12-15 | Discharge: 2022-12-15 | Disposition: A | Discharge status: Home / Self Care | Payer: Medicaid Other | Source: Ambulatory Visit | Attending: Physician Assistant | Admitting: Physician Assistant

## 2022-12-15 DIAGNOSIS — R590 Localized enlarged lymph nodes: Secondary | ICD-10-CM | POA: Diagnosis not present

## 2022-12-15 DIAGNOSIS — R221 Localized swelling, mass and lump, neck: Secondary | ICD-10-CM | POA: Diagnosis not present

## 2022-12-18 ENCOUNTER — Telehealth (INDEPENDENT_AMBULATORY_CARE_PROVIDER_SITE_OTHER): Payer: Medicaid Other | Admitting: Licensed Clinical Social Worker

## 2022-12-18 DIAGNOSIS — F4010 Social phobia, unspecified: Secondary | ICD-10-CM | POA: Diagnosis not present

## 2022-12-18 DIAGNOSIS — F3177 Bipolar disorder, in partial remission, most recent episode mixed: Secondary | ICD-10-CM

## 2022-12-18 NOTE — Progress Notes (Signed)
THERAPIST PROGRESS NOTE  Session Time: 8:00AM-8:35AM  Participation Level: Active  Behavioral Response: CasualAlertAnxious  Type of Therapy: Individual Therapy  Treatment Goals addressed:  Reduce overall frequency, intensity and duration of anxiety so that daily functioning is not impaired per pt self report 3 out of 5 sessions.   Alleviate depressive/manic symptoms and return to improved levels of effective functioning per pt self report 3 out of 5 sessions.   ProgressTowards Goals: Progressing  Interventions: CBT, DBT, Motivational Interviewing, Strength-based, and Other: ACT  Virtual Visit via Video Note  I connected with Terri [name] on 12/18/22 at  8:00 AM EDT by a video enabled telemedicine application and verified that I am speaking with the correct person using two identifiers.  Location: Patient: located in pt home Provider: working remotely in [place], [place]   I discussed the limitations of evaluation and management by telemedicine and the availability of in person appointments. The patient expressed understanding and agreed to proceed.  I discussed the assessment and treatment plan with the patient. The patient was provided an opportunity to ask questions and all were answered. The patient agreed with the plan and demonstrated an understanding of the instructions.   The patient was advised to call back or seek an in-person evaluation if the symptoms worsen or if the condition fails to improve as anticipated.  I provided 35 minutes of non-face-to-face time during this encounter.   Geoffry Paradise, LCSW  Summary: Terri [name] is a 27 y.o. female who presents with sxs of anxiety related to Bipolar disorder and hx of trauma. Sxs endorsed including but not limited to worry, difficulty controlling worry, fatigue, irritability, mistrust, and negative self affect. Pt oriented to person, place, and time. Pt denies SI/HI or A/V hallucinations. Pt was cooperative  during visit and was engaged throughout the visit. Pt does not report any other concerns at the time of visit.  Pt reported that her daughter is home sick. Pt reported that she is working to make this a self care weekend for herself and her daughter.   Pt reported that she is going to a market she enjoys over the weekend and is nervious and anxious about being perceived. Assisted pt in understanding how this anxiety is informed by her childhood. Pt reported tendency to rush through experiences where she feels inconvenient. Invited pt to practice curiosity when she feels she is bothering someone by picking something in the room she is in to be curious about to ground self. Invited pt to ask anxiety "why questions" to better understand anxiety's logic and discern if pt agrees with anxiety's logic. Introduced pt to emotion regulation work and provided psychoed re: role of emotions and how to develop working relationship with emotions. Shared with patient that getting rid of emotions may be dangerous as they do serve a purpose for pt. Discussed importance of working alongside emotions as opposed to being controlled by emotions.   LCSW provided mood monitoring and treatment progress review in the context of this episode of treatment. LCSW reviewed the pt's mood status since last session.   Pt is continuing to apply interventions/techniques learned in session into daily life situations. Pt is currently on track to meet goals utilizing interventions that are discussed in session. Treatment to continue as indicated. Personal growth and progress toward goals noted above.  Continued Recommendations as followed: Self-care behaviors, positive social engagements, focusing on positive physical and emotional wellness, and focusing on life/work balance.     Suicidal/Homicidal: Nowithout  intent/plan  Therapist Response:  Provided pt education re: acceptance. Discussed how to make acceptance accessible at all parts of  pt's healing journey.   Approached pt with strengths based perspective to assist pt in exploring strengths in moments of feeling low.   LCSW practiced active listening to validate pt participation, build rapport, and create safe space for pt to feel heard as they are disclosing their thoughts and feelings.   LCSW utilized therapeutic conversation skills informed by CBT, DBT, and ACT to expose pt to multiple ways of thinking about healing and to provide pt to access to multiple interventions.  Introduced pt to emotion regulation work and provided psychoed re: role of emotions and how to develop working relationship with emotions. Shared with patient that getting rid of emotions may be dangerous as they do serve a purpose for pt. Discussed importance of working alongside emotions as opposed to being controlled by emotions.    Plan: Return again in 2 weeks.  Diagnosis: Bipolar disorder, in partial remission, most recent episode mixed  Social anxiety disorder    10/21/2022    2:10 PM 09/23/2022    4:57 PM 09/02/2022    2:08 PM 07/07/2022    1:25 PM  GAD 7 : Generalized Anxiety Score  Nervous, Anxious, on Edge Control/stop worrying 1 1 0 1  Worry too much - different things 0  Trouble relaxing 2  0 2  Restless 1 0 0 2  Easily annoyed or irritable Afraid - awful might happen 0 0 1 0  Total GAD 7 Score Anxiety Difficulty Very difficult Somewhat difficult Somewhat difficult Somewhat difficult      10/21/2022    2:09 PM 09/23/2022    4:56 PM 09/02/2022    2:07 PM  Depression screen PHQ 2/9  Decreased Interest 0 1 0  Down, Depressed, Hopeless 1 0 0  PHQ - 2 Score 1 1 0  Altered sleeping Tired, decreased energy 1 0 3  Change in appetite 0 0 0  Feeling bad or failure about yourself  0 0 0  Trouble concentrating Moving slowly or fidgety/restless 0 0 0  Suicidal thoughts 0 0 0  PHQ-9 Score Difficult doing work/chores Not difficult at  all      Collaboration of Care: Psychiatrist AEB Dr. Elna Breslow  Patient/Guardian was advised Release of Information must be obtained prior to any record release in order to collaborate their care with an outside provider. Patient/Guardian was advised if they have not already done so to contact the registration department to sign all necessary forms in order for [place] to release information regarding their care.   Consent: Patient/Guardian gives verbal consent for treatment and assignment of benefits for services provided during this visit. Patient/Guardian expressed understanding and agreed to proceed.   Geoffry Paradise, LCSW 12/18/2022

## 2022-12-23 ENCOUNTER — Ambulatory Visit: Payer: Medicaid Other | Admitting: Physician Assistant

## 2022-12-25 ENCOUNTER — Other Ambulatory Visit: Payer: Self-pay | Admitting: Psychiatry

## 2022-12-25 DIAGNOSIS — F4010 Social phobia, unspecified: Secondary | ICD-10-CM

## 2022-12-25 DIAGNOSIS — F3161 Bipolar disorder, current episode mixed, mild: Secondary | ICD-10-CM

## 2022-12-25 MED ORDER — LAMOTRIGINE 25 MG PO TABS: 75.0000 mg | ORAL_TABLET | Freq: Every day | ORAL | 0 refills | Status: DC

## 2022-12-25 NOTE — Telephone Encounter (Signed)
From: Therisa Doyne Fuster To: Office of Jomarie Longs, MD Sent: 12/25/2022 7:53 AM EDT Subject: Medication Renewal Request  Refills have been requested for the following medications:   lamoTRIgine (LAMICTAL) 25 MG tablet [Rex Oesterle]  Preferred pharmacy: [id number] - [place] ([place]), [place] - [address] Delivery method: [employer]

## 2022-12-25 NOTE — Telephone Encounter (Signed)
I have sent Lamictal to pharmacy. °

## 2022-12-31 ENCOUNTER — Telehealth: Payer: Self-pay | Admitting: Licensed Clinical Social Worker

## 2022-12-31 ENCOUNTER — Telehealth (INDEPENDENT_AMBULATORY_CARE_PROVIDER_SITE_OTHER): Payer: Self-pay | Admitting: Licensed Clinical Social Worker

## 2022-12-31 DIAGNOSIS — Z91199 Patient's noncompliance with other medical treatment and regimen due to unspecified reason: Secondary | ICD-10-CM

## 2022-12-31 NOTE — Progress Notes (Signed)
LCSW resent link to pt appt via text and email at 8:05AM. LCSW called pt and left a vm at 8:12AM inquiring about pt attendance today and encouraging pt to call front desk re: scheduling.  

## 2022-12-31 NOTE — Telephone Encounter (Signed)
LCSW resent link to pt appt via text and email at 8:05AM. LCSW called pt and left a vm at 8:12AM inquiring about pt attendance today and encouraging pt to call front desk re: scheduling.

## 2023-01-14 ENCOUNTER — Ambulatory Visit (INDEPENDENT_AMBULATORY_CARE_PROVIDER_SITE_OTHER): Payer: Medicaid Other | Admitting: Licensed Clinical Social Worker

## 2023-01-14 DIAGNOSIS — Z91199 Patient's noncompliance with other medical treatment and regimen due to unspecified reason: Secondary | ICD-10-CM

## 2023-01-14 NOTE — Progress Notes (Signed)
LCSW resent appt links via text and email at 8:05AM and attempted to call pt to inquire about pt attendance at today's appt at 8:10AM. Left vm inviting pt to reschedule with front desk should she desire to continue tx.

## 2023-01-26 ENCOUNTER — Telehealth: Payer: Medicaid Other | Admitting: Nurse Practitioner

## 2023-01-26 DIAGNOSIS — R300 Dysuria: Secondary | ICD-10-CM

## 2023-01-26 NOTE — Progress Notes (Signed)
Because we have treated two UTIs virtually in the past 6 months, I feel your condition warrants further evaluation and I recommend that you be seen for a face to face visit.  Please contact your primary care physician practice to be seen. Many offices offer virtual options to be seen via video if you are not comfortable going in person to a medical facility at this time.  When this becomes recurrent it is best to have a urine sample for culture collected, you can have this done at your primary care office or at an Urgent Care  NOTE: You will NOT be charged for this eVisit.  If you do not have a PCP, [facility] offers a free physician referral service available at [phone]. Our trained staff has the experience, knowledge and resources to put you in touch with a physician who is right for you.    If you are having a true medical emergency please call 911.   Your e-visit answers were reviewed by a board certified advanced clinical practitioner to complete your personal care plan.  Thank you for using e-Visits.

## 2023-02-04 ENCOUNTER — Ambulatory Visit
Admission: EM | Admit: 2023-02-04 | Discharge: 2023-02-04 | Disposition: A | Discharge status: Home / Self Care | Payer: Medicaid Other | Attending: Emergency Medicine | Admitting: Emergency Medicine

## 2023-02-04 DIAGNOSIS — N390 Urinary tract infection, site not specified: Secondary | ICD-10-CM

## 2023-02-04 DIAGNOSIS — Z3202 Encounter for pregnancy test, result negative: Secondary | ICD-10-CM | POA: Insufficient documentation

## 2023-02-04 LAB — POCT URINALYSIS DIP (MANUAL ENTRY)
Glucose, UA: NEGATIVE mg/dL
Ketones, POC UA: NEGATIVE mg/dL
Nitrite, UA: POSITIVE — AB
Protein Ur, POC: 100 mg/dL — AB
Spec Grav, UA: 1.025 (ref 1.010–1.025)
Urobilinogen, UA: 2 E.U./dL — AB
pH, UA: 7 (ref 5.0–8.0)

## 2023-02-04 LAB — POCT URINE PREGNANCY: Preg Test, Ur: NEGATIVE

## 2023-02-04 MED ORDER — CEPHALEXIN 500 MG PO CAPS: 500.0000 mg | ORAL_CAPSULE | Freq: Two times a day (BID) | ORAL | 0 refills | Status: AC

## 2023-02-04 NOTE — ED Provider Notes (Signed)
Renaldo Fiddler    CSN: [id number] Arrival date & time: 02/04/23  1047      History   Chief Complaint Chief Complaint  Patient presents with   Urinary Frequency    Suspected UTI. In need of antibiotics. - Entered by patient    HPI Terri [name] is a 27 y.o. female.  Patient presents with 3-day history of dysuria and urinary frequency.  No fever, abdominal pain, hematuria, vaginal discharge, flank pain, or other symptoms.  No OTC medications taken.  Patient had an e-visit on 01/26/2023; diagnosed with dysuria; instructed to be seen in person because she had been treated for two UTIs virtually in the past 6 months.  She reports history of recurrent UTIs.  The history is provided by the patient and medical records.    Past Medical History:  Diagnosis Date   Anxiety    No pertinent past medical history    Pseudotumor cerebri     Patient Active Problem List   Diagnosis Date Noted   Social anxiety disorder 07/07/2022   Cocaine use disorder, moderate, in sustained remission (HCC) 07/07/2022   Cannabis use disorder, mild, in sustained remission 07/07/2022   Obesity during pregnancy, antepartum 06/24/2021   Insufficient prenatal care 06/24/2021   Late prenatal care affecting pregnancy in third trimester 06/24/2021   Marijuana use during pregnancy 06/24/2021   Left hand pain 10/30/2020   Arthralgia of both knees and bilateral hands.  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    Past Surgical History:  Procedure  Laterality Date   CHOLECYSTECTOMY N/A 11/04/2018   Procedure: LAPAROSCOPIC CHOLECYSTECTOMY;  Surgeon: Henrene Dodge, MD;  Location: [facility] ORS;  Service: General;  Laterality: N/A;   DILATION AND EVACUATION N/A 07/13/2017   Procedure: DILATATION AND EVACUATION;  Surgeon: Ward, Elenora Fender, MD;  Location: [facility] ORS;  Service: Gynecology;  Laterality: N/A;   ERCP N/A 11/14/2018   Procedure: ENDOSCOPIC RETROGRADE CHOLANGIOPANCREATOGRAPHY (ERCP);  Surgeon: Midge Minium, MD;  Location: [facility];  Service: Endoscopy;  Laterality: N/A;    OB History     Gravida  5   Para  3   Term  3   Preterm      AB  2   Living  3      SAB  1   IAB      Ectopic      Multiple  0   Live Births  3            Home Medications    Prior to Admission medications   Medication Sig Start Date End Date Taking? Authorizing Provider  cephALEXin (KEFLEX) 500 MG capsule Take 1 capsule (500 mg total) by mouth 2 (two) times daily for 5 days. 02/04/23 02/09/23 Yes Mickie Bail, NP  acetaminophen (TYLENOL) 325 MG tablet Take 650 mg by mouth every 6 (six) hours as needed for moderate pain or headache.    [provider]  buPROPion (WELLBUTRIN) 75 MG tablet Take 1 tablet (75 mg total) by mouth in the morning. 10/21/22   Jomarie Longs, MD  cyclobenzaprine (FLEXERIL)  5 MG tablet Take 1 tablet (5 mg total) by mouth 3 (three) times daily as needed for muscle spasms. 12/07/22   Alfredia Ferguson, PA-C  lamoTRIgine (LAMICTAL) 25 MG tablet Take 3 tablets (75 mg total) by mouth daily. 12/25/22   Jomarie Longs, MD  levonorgestrel (KYLEENA) 19.5 MG IUD 1 each by Intrauterine route once.    [provider]  PARoxetine (PAXIL) 20 MG tablet Take 0.5-1 tablets (10-20 mg total) by mouth as directed. Take 10 mg daily for 15 days and increase to 20 mg daily after that 10/21/22   Jomarie Longs, MD    Family History Family History  Problem Relation Age of Onset   Drug abuse Mother    Arthritis Mother     Thyroid disease Mother    Depression Mother    Anxiety disorder Mother    Suicidality Mother    ALS Maternal Grandfather    Anxiety disorder Maternal Grandmother    Depression Maternal Grandmother    COPD Maternal Grandmother    ADD / ADHD Son    Breast cancer Neg Hx    Colon cancer Neg Hx     Social History Social History   Tobacco Use   Smoking status: Every Day    Packs/day: 0.50    Years: 8.00    Additional pack years: 0.00    Total pack years: 4.00    Types: Cigarettes   Smokeless tobacco: Never  Vaping Use   Vaping Use: Never used  Substance Use Topics   Alcohol use: Yes    Comment: social   Drug use: Yes    Types: Marijuana, Cocaine     Allergies   Sulfa antibiotics and Zithromax [azithromycin]   Review of Systems Review of Systems  Constitutional:  Negative for chills and fever.  Gastrointestinal:  Negative for abdominal pain, nausea and vomiting.  Genitourinary:  Positive for dysuria and frequency. Negative for flank pain, hematuria, pelvic pain and vaginal discharge.     Physical Exam Triage Vital Signs ED Triage Vitals  Enc Vitals Group     BP      Pulse      Resp      Temp      Temp src      SpO2      Weight      Height      Head Circumference      Peak Flow      Pain Score      Pain Loc      Pain Edu?      Excl. in GC?    No data found.  Updated Vital Signs BP 114/78   Pulse 98   Temp 97.9 F (36.6 C)   Resp 18   SpO2 96%   Visual Acuity Right Eye Distance:   Left Eye Distance:   Bilateral Distance:    Right Eye Near:   Left Eye Near:    Bilateral Near:     Physical Exam Vitals and nursing note reviewed.  Constitutional:      General: She is not in acute distress.    Appearance: She is well-developed.  HENT:     Mouth/Throat:     Mouth: Mucous membranes are moist.  Cardiovascular:     Rate and Rhythm: Normal rate and regular rhythm.  Pulmonary:     Effort: Pulmonary effort is normal. No respiratory distress.   Abdominal:     General: Bowel sounds are normal.     Palpations: Abdomen  is soft.     Tenderness: There is no abdominal tenderness. There is no right CVA tenderness, left CVA tenderness, guarding or rebound.  Musculoskeletal:     Cervical back: Neck supple.  Skin:    General: Skin is warm and dry.  Neurological:     Mental Status: She is alert.  Psychiatric:        Mood and Affect: Mood normal.        Behavior: Behavior normal.      UC Treatments / Results  Labs (all labs ordered are listed, but only abnormal results are displayed) Labs Reviewed  POCT URINALYSIS DIP (MANUAL ENTRY) - Abnormal; Notable for the following components:      Result Value   Clarity, UA cloudy (*)    Bilirubin, UA small (*)    Blood, UA trace-intact (*)    Protein Ur, POC =100 (*)    Urobilinogen, UA 2.0 (*)    Nitrite, UA Positive (*)    Leukocytes, UA Large (3+) (*)    All other components within normal limits  URINE CULTURE  POCT URINE PREGNANCY    EKG   Radiology No results found.  Procedures Procedures (including critical care time)  Medications Ordered in UC Medications - No data to display  Initial Impression / Assessment and Plan / UC Course  I have reviewed the triage vital signs and the nursing notes.  Pertinent labs & imaging results that were available during my care of the patient were reviewed by me and considered in my medical decision making (see chart for details).    UTI, Negative pregnant test.  Treating with Keflex. Urine culture pending. Discussed with patient that we will call her if the urine culture shows the need to change or discontinue the antibiotic. Instructed her to follow-up with her PCP if her symptoms are not improving. Patient agrees to plan of care.     Final Clinical Impressions(s) / UC Diagnoses   Final diagnoses:  Urinary tract infection without hematuria, site unspecified  Negative pregnancy test     Discharge Instructions      Take the  antibiotic as directed.  The urine culture is pending.  We will call you if it shows the need to change or discontinue your antibiotic.    Follow up with your primary care provider if your symptoms are not improving.        ED Prescriptions     Medication Sig Dispense Auth. Provider   cephALEXin (KEFLEX) 500 MG capsule Take 1 capsule (500 mg total) by mouth 2 (two) times daily for 5 days. 10 capsule Mickie Bail, NP      PDMP not reviewed this encounter.   Mickie Bail, NP 02/04/23 1136

## 2023-02-04 NOTE — Discharge Instructions (Addendum)
Take the antibiotic as directed.  The urine culture is pending.  We will call you if it shows the need to change or discontinue your antibiotic.    Follow up with your primary care provider if your symptoms are not improving.    

## 2023-02-04 NOTE — ED Triage Notes (Signed)
Patient to Urgent Care with complaints of urinary frequency/ dysuria/ dark urine that started three days ago.  Reports hx of recurrent UTIs. Denies any vaginal discharge.

## 2023-02-07 LAB — URINE CULTURE: Culture: >=100000 — AB

## 2023-03-05 ENCOUNTER — Telehealth: Payer: Medicaid Other | Admitting: Emergency Medicine

## 2023-03-05 DIAGNOSIS — N390 Urinary tract infection, site not specified: Secondary | ICD-10-CM

## 2023-03-05 NOTE — Progress Notes (Signed)
Virtual Visit Consent   Terri [name], you are scheduled for a virtual visit with a [facility] provider today. Just as with appointments in the office, your consent must be obtained to participate. Your consent will be active for this visit and any virtual visit you may have with one of our providers in the next 365 days. If you have a MyChart account, a copy of this consent can be sent to you electronically.  As this is a virtual visit, video technology does not allow for your provider to perform a traditional examination. This may limit your provider's ability to fully assess your condition. If your provider identifies any concerns that need to be evaluated in person or the need to arrange testing (such as labs, EKG, etc.), we will make arrangements to do so. Although advances in technology are sophisticated, we cannot ensure that it will always work on either your end or our end. If the connection with a video visit is poor, the visit may have to be switched to a telephone visit. With either a video or telephone visit, we are not always able to ensure that we have a secure connection.  By engaging in this virtual visit, you consent to the provision of healthcare and authorize for your insurance to be billed (if applicable) for the services provided during this visit. Depending on your insurance coverage, you may receive a charge related to this service.  I need to obtain your verbal consent now. Are you willing to proceed with your visit today? [name] has provided verbal consent on 03/05/2023 for a virtual visit (video or telephone). Roxy Horseman, PA-C  Date: 03/05/2023 10:19 AM  Virtual Visit via Video Note   I, Roxy Horseman, connected with  Terri [name]  ([id number], [birth year]) on 03/05/23 at 10:15 AM EDT by a video-enabled telemedicine application and verified that I am speaking with the correct person using two identifiers.  Location: Patient: Virtual Visit  Location Patient: Home Provider: Virtual Visit Location Provider: Home Office   I discussed the limitations of evaluation and management by telemedicine and the availability of in person appointments. The patient expressed understanding and agreed to proceed.    History of Present Illness: Terri [name] is a 27 y.o. who identifies as a female who was assigned female at birth, and is being seen today for UTI.  States that she was seen at urgent care about a month ago.  States that she was treated with Keflex and had a culture done.  She states that she has frequent UTIs.  She asks if she needs to be on another antibiotic or a stronger antibiotic.  HPI: HPI  Problems:  Patient Active Problem List   Diagnosis Date Noted   Social anxiety disorder 07/07/2022   Cocaine use disorder, moderate, in sustained remission (HCC) 07/07/2022   Cannabis use disorder, mild, in sustained remission 07/07/2022   Obesity during pregnancy, antepartum 06/24/2021   Insufficient prenatal care 06/24/2021   Late prenatal care affecting pregnancy in third trimester 06/24/2021   Marijuana use during pregnancy 06/24/2021   Left hand pain 10/30/2020   Arthralgia of both knees and bilateral hands.  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  Told allergic as child but believes she's taken since.   Zithromax [Azithromycin] Other (See Comments)     Reaction: unknown Told reaction as a child   Medications:  Current Outpatient Medications:    acetaminophen (TYLENOL) 325 MG tablet, Take 650 mg by mouth every 6 (six) hours as needed for moderate pain or headache., Disp: , Rfl:    buPROPion (WELLBUTRIN) 75 MG tablet, Take 1 tablet (75 mg total) by mouth in the morning., Disp: 10 tablet, Rfl: 0   cyclobenzaprine (FLEXERIL) 5 MG tablet, Take 1 tablet (5 mg total) by mouth 3 (three) times daily as needed for muscle spasms., Disp: 30 tablet, Rfl: 1   lamoTRIgine (LAMICTAL) 25 MG tablet, Take 3 tablets (75 mg total) by mouth daily., Disp: 90 tablet, Rfl: 0   levonorgestrel (KYLEENA) 19.5 MG IUD, 1 each by Intrauterine route once., Disp: , Rfl:    PARoxetine (PAXIL) 20 MG tablet, Take 0.5-1 tablets (10-20 mg total) by mouth as directed. Take 10 mg daily for 15 days and increase to 20 mg daily after that, Disp: 30 tablet, Rfl: 1  Observations/Objective: Patient is well-developed, well-nourished in no acute distress.  Resting comfortably  at home.  Head is normocephalic, atraumatic.  No labored breathing.  Speech is clear and coherent with logical content.  Patient is alert and oriented at baseline.    Assessment and Plan: 1. Recurrent UTI   - You need to follow-up with primary care or urology for recurrent UTIs.  As we discussed in our video call, recurrent UTIs cannot be managed with a virtual visit.  Please follow-up as directed.  Follow Up Instructions: I discussed the assessment and treatment plan with the patient. The patient was provided an opportunity to ask questions and all were answered. The patient agreed with the plan and demonstrated an understanding of the instructions.  A copy of instructions were sent to the patient via MyChart unless otherwise noted below.     The patient was advised to call back or seek an in-person evaluation if the symptoms worsen or if the condition fails to improve as anticipated.  Time:  I spent 10  minutes with the patient via telehealth technology discussing the above problems/concerns.    Roxy Horseman, PA-C

## 2023-03-05 NOTE — Patient Instructions (Signed)
  Terri [name], thank you for joining Roxy Horseman, PA-C for today's virtual visit.  While this provider is not your primary care provider (PCP), if your PCP is located in our provider database this encounter information will be shared with them immediately following your visit.   Please follow-up with your PCP or urology.  The urology office contact info is:  [facility] [address] [place], [place] [phone]  You will NOT be billed for this encounter.  A [facility] MyChart account gives you access to today's visit and all your visits, tests, and labs performed at [facility] " click here if you don't have a [facility] MyChart account or go to [email or web address]  Consent: (Patient) Terri Doyne Fauth provided verbal consent for this virtual visit at the beginning of the encounter.  Current Medications:  Current Outpatient Medications:    acetaminophen (TYLENOL) 325 MG tablet, Take 650 mg by mouth every 6 (six) hours as needed for moderate pain or headache., Disp: , Rfl:    buPROPion (WELLBUTRIN) 75 MG tablet, Take 1 tablet (75 mg total) by mouth in the morning., Disp: 10 tablet, Rfl: 0   cyclobenzaprine (FLEXERIL) 5 MG tablet, Take 1 tablet (5 mg total) by mouth 3 (three) times daily as needed for muscle spasms., Disp: 30 tablet, Rfl: 1   lamoTRIgine (LAMICTAL) 25 MG tablet, Take 3 tablets (75 mg total) by mouth daily., Disp: 90 tablet, Rfl: 0   levonorgestrel (KYLEENA) 19.5 MG IUD, 1 each by Intrauterine route once., Disp: , Rfl:    PARoxetine (PAXIL) 20 MG tablet, Take 0.5-1 tablets (10-20 mg total) by mouth as directed. Take 10 mg daily for 15 days and increase to 20 mg daily after that, Disp: 30 tablet, Rfl: 1   Medications ordered in this encounter:  No orders of the defined types were placed in this encounter.    *If you need refills on other medications prior to your next appointment, please contact your pharmacy*  Follow-Up: Call back  or seek an in-person evaluation if the symptoms worsen or if the condition fails to improve as anticipated.  [facility] [phone]  Other Instructions    If you have been instructed to have an in-person evaluation today at a local Urgent Care facility, please use the link below. It will take you to a list of all of our available [facility], including address, phone number and hours of operation. Please do not delay care.  [facility]  If you or a family member do not have a primary care provider, use the link below to schedule a visit and establish care. When you choose a [facility] primary care physician or advanced practice provider, you gain a long-term partner in health. Find a Primary Care Provider  Learn more about [facility] in-office and virtual care options:  - Get Care Now

## 2023-03-16 ENCOUNTER — Telehealth: Payer: Self-pay | Admitting: Psychiatry

## 2023-03-16 DIAGNOSIS — F4010 Social phobia, unspecified: Secondary | ICD-10-CM

## 2023-03-16 DIAGNOSIS — F3161 Bipolar disorder, current episode mixed, mild: Secondary | ICD-10-CM

## 2023-03-16 MED ORDER — LAMOTRIGINE 25 MG PO TABS: 75.0000 mg | ORAL_TABLET | Freq: Every day | ORAL | 1 refills | Status: DC

## 2023-03-16 NOTE — Telephone Encounter (Signed)
Contacted patient by phone to discuss concerns that she likely not compliant on the Lamictal and if she ran out of Lamictal for more than 2 days we may have to start at 25 mg daily.  Patient reports she had some extra supplies which she got from family member who was on the same medication, she was out of town and was able to use the supply.  She hence has been taking the same dosage and has not ran out.  I have sent lamotrigine 75 mg # 90 tablets with a refill to pharmacy at [facility].  Patient advised to keep her appointment which is coming up.

## 2023-04-12 ENCOUNTER — Other Ambulatory Visit: Payer: Self-pay

## 2023-04-12 ENCOUNTER — Emergency Department
Admission: EM | Admit: 2023-04-12 | Discharge: 2023-04-12 | Disposition: A | Discharge status: Home / Self Care | Payer: No Typology Code available for payment source | Attending: Emergency Medicine | Admitting: Emergency Medicine

## 2023-04-12 DIAGNOSIS — S161XXA Strain of muscle, fascia and tendon at neck level, initial encounter: Secondary | ICD-10-CM | POA: Insufficient documentation

## 2023-04-12 DIAGNOSIS — Y92481 Parking lot as the place of occurrence of the external cause: Secondary | ICD-10-CM | POA: Insufficient documentation

## 2023-04-12 DIAGNOSIS — M542 Cervicalgia: Secondary | ICD-10-CM | POA: Diagnosis present

## 2023-04-12 MED ORDER — IBUPROFEN 800 MG PO TABS
800.0000 mg | ORAL_TABLET | Freq: Once | ORAL | Status: AC
  Administered 2023-04-12: 800 mg via ORAL
  Filled 2023-04-12: qty 1

## 2023-04-12 MED ORDER — CYCLOBENZAPRINE HCL 5 MG PO TABS: 5.0000 mg | ORAL_TABLET | Freq: Three times a day (TID) | ORAL | 0 refills | Status: DC | PRN

## 2023-04-12 MED ORDER — IBUPROFEN 600 MG PO TABS: 600.0000 mg | ORAL_TABLET | Freq: Four times a day (QID) | ORAL | 0 refills | Status: DC | PRN

## 2023-04-12 MED ORDER — CYCLOBENZAPRINE HCL 10 MG PO TABS
10.0000 mg | ORAL_TABLET | Freq: Once | ORAL | Status: AC
  Administered 2023-04-12: 10 mg via ORAL
  Filled 2023-04-12: qty 1
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
## 2023-04-12 NOTE — Discharge Instructions (Signed)
Please apply ice to the sore muscles on the right side of your neck.  You may also transition ice with heat.  Please take Tylenol, ibuprofen and Flexeril as needed for your symptoms and follow-up with orthopedics or primary care provider in 1 week if no improvement

## 2023-04-21 ENCOUNTER — Telehealth: Payer: Medicaid Other | Admitting: Emergency Medicine

## 2023-04-21 DIAGNOSIS — K0889 Other specified disorders of teeth and supporting structures: Secondary | ICD-10-CM | POA: Diagnosis not present

## 2023-04-21 MED ORDER — AMOXICILLIN-POT CLAVULANATE 875-125 MG PO TABS: 1.0000 | ORAL_TABLET | Freq: Two times a day (BID) | ORAL | 0 refills | Status: DC

## 2023-04-21 NOTE — Progress Notes (Signed)
E-Visit for Dental Pain  We are sorry that you are not feeling well.  Here is how we plan to help!  Based on what you have shared with me in the questionnaire, it sounds like you have a broken tooth causing pain.   I am prescribing an antibiotic as sometimes dentists require you to have been treated for possible infection before seeing you. Please take:  Augmentin 875-125mg  twice a day for 7 days  Call you dentist first thing in the morning.  It is imperative that you see a dentist within 10 days of this eVisit to determine the cause of the dental pain and be sure it is adequately treated  A toothache or tooth pain is caused when the nerve in the root of a tooth or surrounding a tooth is irritated. Dental (tooth) infection, decay, injury, or loss of a tooth are the most common causes of dental pain. Pain may also occur after an extraction (tooth is pulled out). Pain sometimes originates from other areas and radiates to the jaw, thus appearing to be tooth pain.Bacteria growing inside your mouth can contribute to gum disease and dental decay, both of which can cause pain. A toothache occurs from inflammation of the central portion of the tooth called pulp. The pulp contains nerve endings that are very sensitive to pain. Inflammation to the pulp or pulpitis may be caused by dental cavities, trauma, and infection.    HOME CARE:   For toothaches: Over-the-counter pain medications such as acetaminophen or ibuprofen may be used. Take these as directed on the package while you arrange for a dental appointment. Avoid very cold or hot foods, because they may make the pain worse. You may get relief from biting on a cotton ball soaked in oil of cloves. You can get oil of cloves at most drug stores.  For jaw pain:  Aspirin may be helpful for problems in the joint of the jaw in adults. If pain happens every time you open your mouth widely, the temporomandibular joint (TMJ) may be the source of the pain.  Yawning or taking a large bite of food may worsen the pain. An appointment with your doctor or dentist will help you find the cause.     GET HELP RIGHT AWAY IF:  You have a high fever or chills If you have had a recent head or face injury and develop headache, light headedness, nausea, vomiting, or other symptoms that concern you after an injury to your face or mouth, you could have a more serious injury in addition to your dental injury. A facial rash associated with a toothache: This condition may improve with medication. Contact your doctor for them to decide what is appropriate. Any jaw pain occurring with chest pain: Although jaw pain is most commonly caused by dental disease, it is sometimes referred pain from other areas. People with heart disease, especially people who have had stents placed, people with diabetes, or those who have had heart surgery may have jaw pain as a symptom of heart attack or angina. If your jaw or tooth pain is associated with lightheadedness, sweating, or shortness of breath, you should see a doctor as soon as possible. Trouble swallowing or excessive pain or bleeding from gums: If you have a history of a weakened immune system, diabetes, or steroid use, you may be more susceptible to infections. Infections can often be more severe and extensive or caused by unusual organisms. Dental and gum infections in people with these conditions may  require more aggressive treatment. An abscess may need draining or IV antibiotics, for example.  MAKE SURE YOU   Understand these instructions. Will watch your condition. Will get help right away if you are not doing well or get worse.  Thank you for choosing an e-visit.  Your e-visit answers were reviewed by a board certified advanced clinical practitioner to complete your personal care plan. Depending upon the condition, your plan could have included both over the counter or prescription medications.  Please review your  pharmacy choice. Make sure the pharmacy is open so you can pick up prescription now. If there is a problem, you may contact your provider through [employer] and have the prescription routed to another pharmacy.  Your safety is important to [place]. If you have drug allergies check your prescription carefully.   For the next 24 hours you can use MyChart to ask questions about today's visit, request a non-urgent call back, or ask for a work or school excuse. You will get an email in the next two days asking about your experience. I hope that your e-visit has been valuable and will speed your recovery.  I have spent 5 minutes in review of e-visit questionnaire, review and updating patient chart, medical decision making and response to patient.   Rica Mast, PhD, FNP-BC

## 2023-04-24 IMAGING — CT CT ANGIO CHEST
2 of 11 series · 15 of 46 positions shown · IV contrast (APPLIED)
Comparison: None.

CLINICAL DATA: PE suspected, high prob 36wk pregnant, recent COVID,
cp/sob

EXAM:
CT ANGIOGRAPHY CHEST WITH CONTRAST
TECHNIQUE: Multidetector CT imaging of the chest was performed using the
standard protocol during bolus administration of intravenous
contrast. Multiplanar CT image reconstructions and MIPs were
obtained to evaluate the vascular anatomy.
CONTRAST:  75mL OMNIPAQUE IOHEXOL 350 MG/ML SOLN

[Series 5: thins · axial · 0.68mm/px · z∈[-423,-282]mm · 12 of 211 slices shown]
[im 17/211  lung]
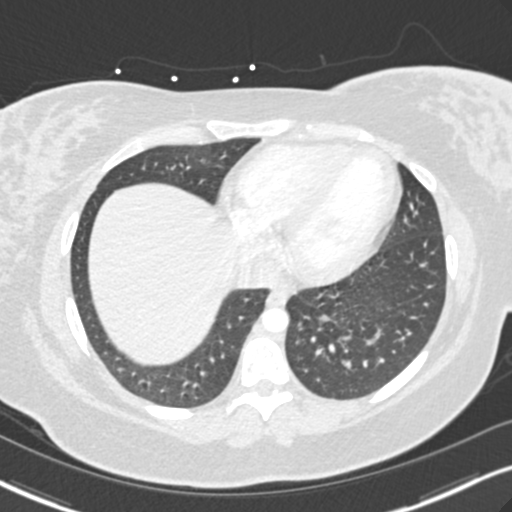
[im 33/211  soft-tissue]
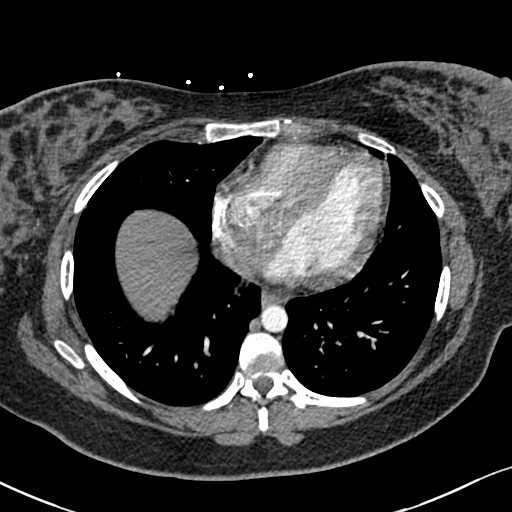
[im 49/211  lung]
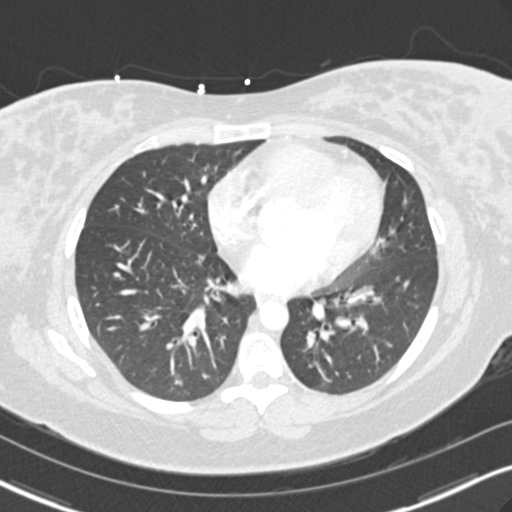
[im 65/211  soft-tissue]
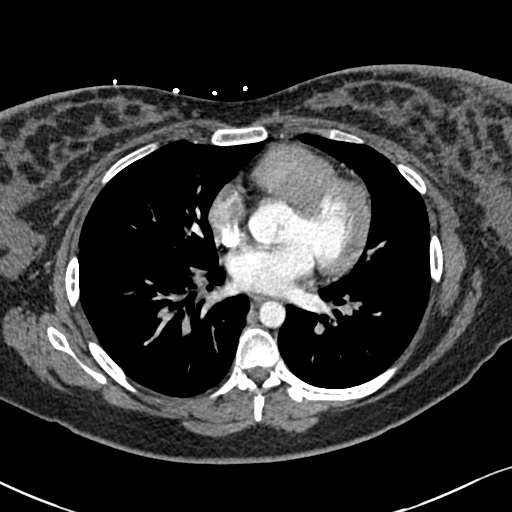
[im 81/211  lung]
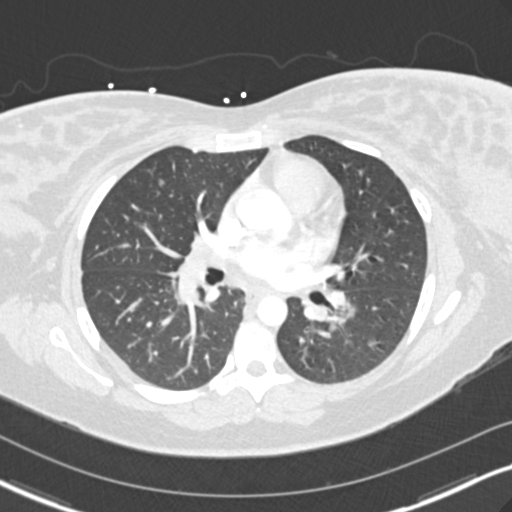
[im 97/211  soft-tissue]
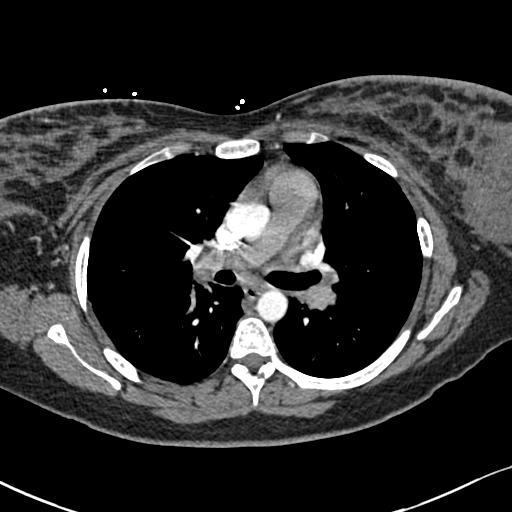
[im 114/211  lung]
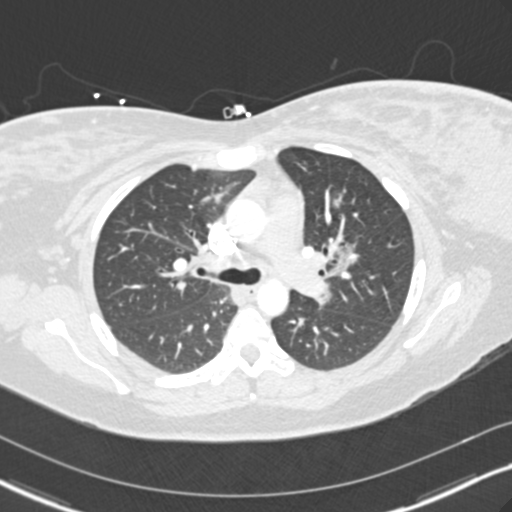
[im 130/211  soft-tissue]
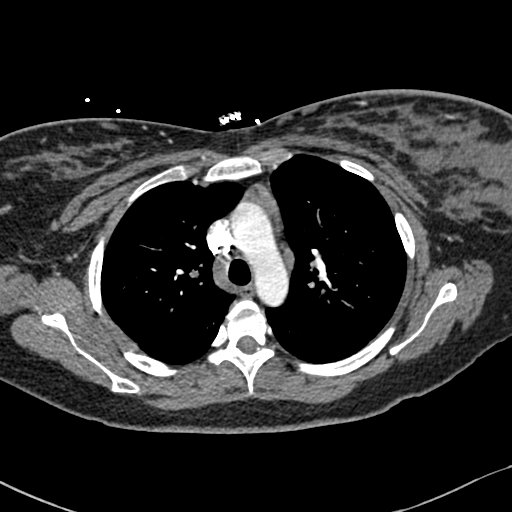
[im 146/211  lung]
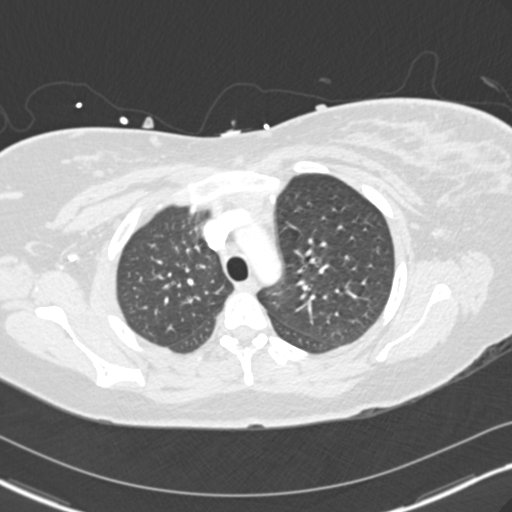
[im 162/211  soft-tissue]
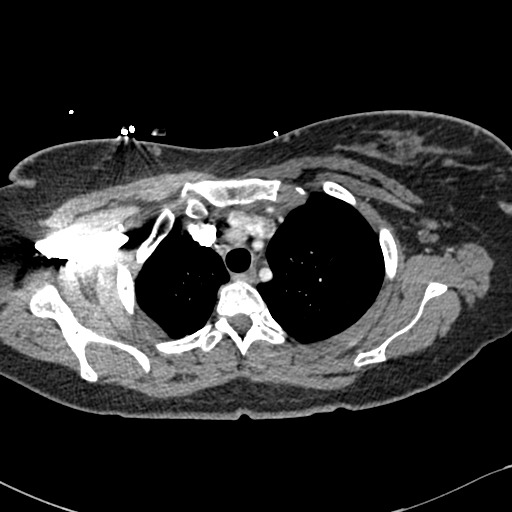
[im 178/211  lung]
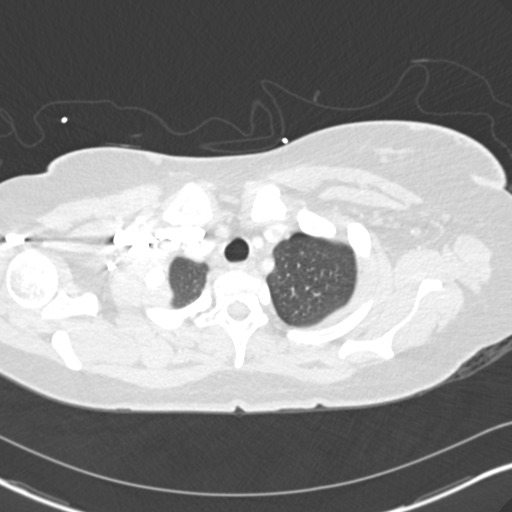
[im 194/211  soft-tissue]
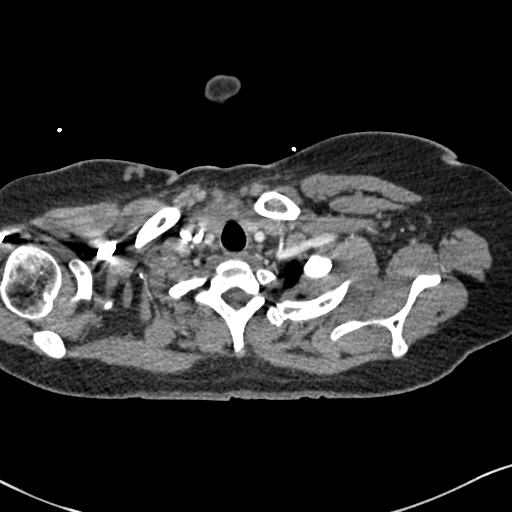

[Series 14: coronal mpr · coronal · 0.28mm/px · 3 of 89 slices shown]
[im 23/89  soft-tissue]
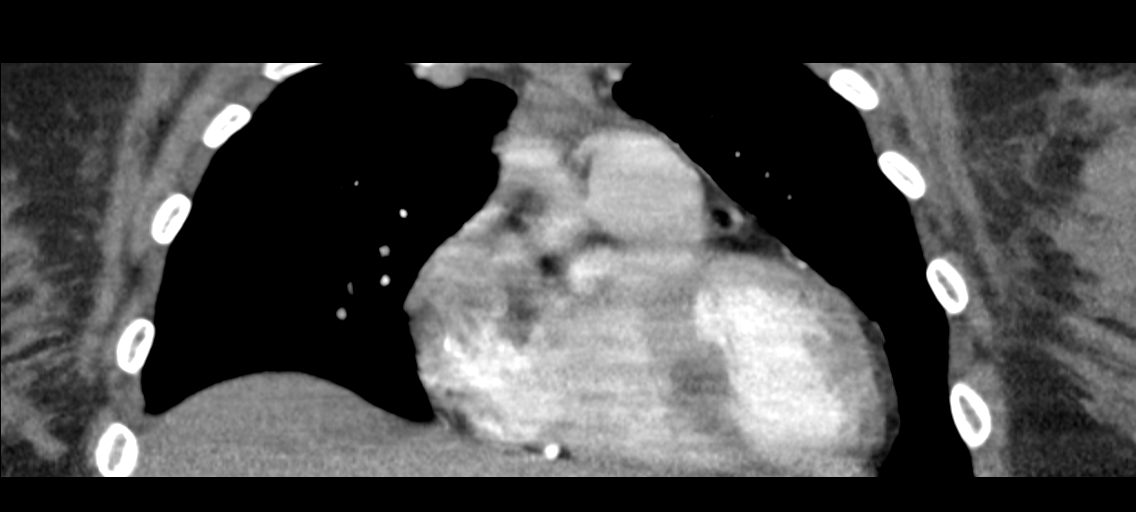
[im 45/89  soft-tissue]
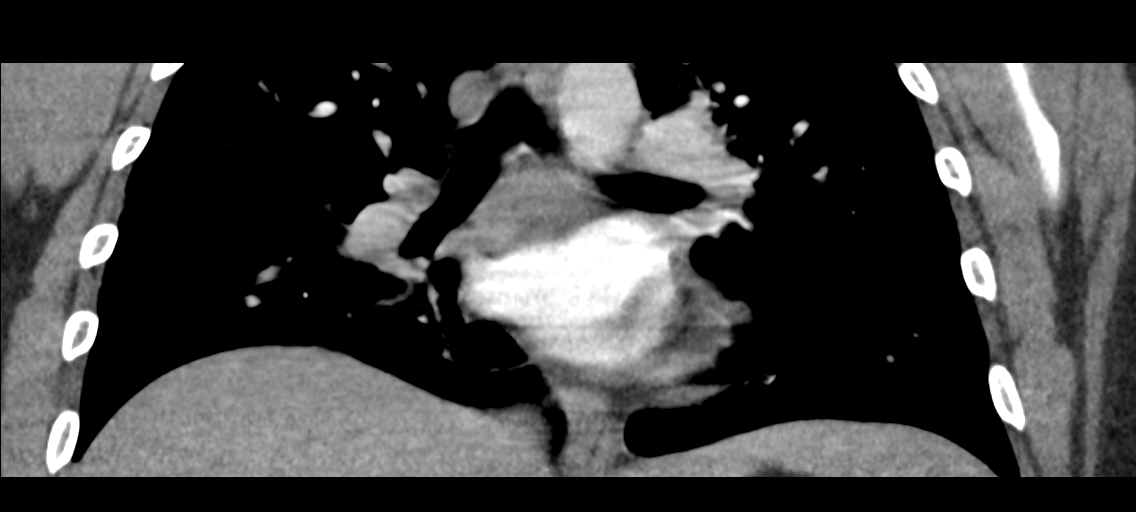
[im 67/89  soft-tissue]
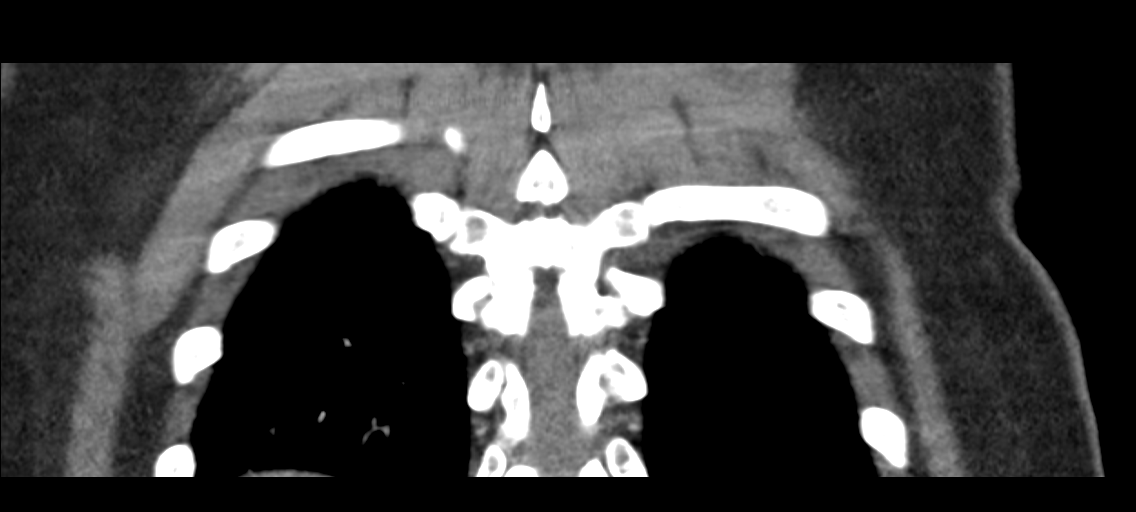

[15 of 46 positions shown; findings below may reference images not displayed]

FINDINGS: Cardiovascular: Initial study nondiagnostic for evaluation of
pulmonary arteries. Improved but still suboptimal pulmonary arterial
enhancement on repeat study. There is no central embolus. Limited
evaluation at lobar level and beyond. Normal heart size. No
pericardial effusion.

Mediastinum/Nodes: No enlarged lymph nodes. Included thyroid is
unremarkable. Included esophagus is unremarkable.

Lungs/Pleura: No mass or consolidation. No pleural effusion or
pneumothorax. A portion of the lung bases is not included.

Upper Abdomen: No acute abnormality.

Musculoskeletal: No acute osseous abnormality.

Review of the MIP images confirms the above findings.
IMPRESSION: Initial study nondiagnostic. Repeat study again with suboptimal
bolus timing. No central embolus but limited evaluation at lobar
level and beyond.

Otherwise no acute abnormality.

## 2023-05-05 ENCOUNTER — Encounter: Payer: Self-pay | Admitting: Psychiatry

## 2023-05-05 ENCOUNTER — Ambulatory Visit
Admission: RE | Admit: 2023-05-05 | Discharge: 2023-05-05 | Disposition: A | Discharge status: Home / Self Care | Payer: Medicaid Other | Source: Ambulatory Visit | Attending: Psychiatry | Admitting: Psychiatry

## 2023-05-05 ENCOUNTER — Ambulatory Visit (INDEPENDENT_AMBULATORY_CARE_PROVIDER_SITE_OTHER): Payer: Medicaid Other | Admitting: Psychiatry

## 2023-05-05 VITALS — BP 139/91 | HR 92 | Temp 98.1°F | Ht 63.0 in | Wt 235.8 lb

## 2023-05-05 DIAGNOSIS — F3178 Bipolar disorder, in full remission, most recent episode mixed: Secondary | ICD-10-CM | POA: Diagnosis not present

## 2023-05-05 DIAGNOSIS — F4010 Social phobia, unspecified: Secondary | ICD-10-CM

## 2023-05-05 DIAGNOSIS — F1421 Cocaine dependence, in remission: Secondary | ICD-10-CM | POA: Diagnosis not present

## 2023-05-05 DIAGNOSIS — F17200 Nicotine dependence, unspecified, uncomplicated: Secondary | ICD-10-CM

## 2023-05-05 DIAGNOSIS — F1211 Cannabis abuse, in remission: Secondary | ICD-10-CM

## 2023-05-05 DIAGNOSIS — Z79899 Other long term (current) drug therapy: Secondary | ICD-10-CM | POA: Insufficient documentation

## 2023-05-05 MED ORDER — LAMOTRIGINE 25 MG PO TABS: 75.0000 mg | ORAL_TABLET | Freq: Every day | ORAL | 1 refills | Status: DC

## 2023-05-05 NOTE — Progress Notes (Unsigned)
BH MD OP Progress Note  05/05/2023 3:31 PM Terri [name]  MRN:  [id number]  Chief Complaint:  Chief Complaint  Patient presents with   Follow-up   Anxiety   mood swings   Medication Refill   HPI: Terri [name] is a 27 year old Caucasian female, lives with her fianc in [place], has a history of bipolar disorder, social anxiety disorder, tobacco use disorder, cocaine and cannabis abuse in remission, pseudotumor cerebri per report was evaluated in office today.  Patient today reports she is currently doing fairly well with regards to her depression symptoms.  Denies any mania or hypomanic symptoms.  She is compliant on the Lamictal.  May have noticed heart palpitations in the past however not sure if Lamictal caused it or not.  She currently denies it.  Patient however continues to struggle with anxiety symptoms.  She reports she struggles with social anxiety and does not like to be around people.  She has a new job right now and works at [employer] as a Engineer, water.  She reports she is not around a lot of people and that has been helpful.  Work is going well.  She also has other situational anxiety usually triggered by situational stressors.  She used to be in therapy with Ms. Allayne Butcher, however has been noncompliant.  Reports therapy sessions were not beneficial.  She is open to establishing care with a new therapist, agrees to check with her health insurance.  Patient reports she does not tolerate SSRIs well.  She had stopped taking the Paxil since it made her more depressed.  Patient denies any suicidality, homicidality or perceptual disturbances.  Patient reports sleep is restless at times.  Her baby currently sleeps through the night.  She does not know what is triggering her sleep problems likely anxiety.  Patient reports she quit smoking a couple of months ago.  Currently staying away from any illicit substances and alcohol.  Denies any other concerns today.  Visit  Diagnosis:    ICD-10-CM   1. Bipolar disorder, in full remission, most recent episode mixed (HCC)  F31.78    Type I    2. Social anxiety disorder  F40.10 lamoTRIgine (LAMICTAL) 25 MG tablet    3. Tobacco use disorder  F17.200    In remission    4. Cocaine use disorder, moderate, in sustained remission (HCC)  F14.21     5. Cannabis use disorder, mild, in sustained remission  F12.11     6. High risk medication use  Z79.899 EKG 12-Lead      Past Psychiatric History: I have reviewed past psychiatric history from progress note on 07/07/2022.  Past Medical History:  Past Medical History:  Diagnosis Date   Anxiety    No pertinent past medical history    Pseudotumor cerebri     Past Surgical History:  Procedure Laterality Date   CHOLECYSTECTOMY N/A 11/04/2018   Procedure: LAPAROSCOPIC CHOLECYSTECTOMY;  Surgeon: Henrene Dodge, MD;  Location: [facility] ORS;  Service: General;  Laterality: N/A;   DILATION AND EVACUATION N/A 07/13/2017   Procedure: DILATATION AND EVACUATION;  Surgeon: Ward, Elenora Fender, MD;  Location: [facility] ORS;  Service: Gynecology;  Laterality: N/A;   ERCP N/A 11/14/2018   Procedure: ENDOSCOPIC RETROGRADE CHOLANGIOPANCREATOGRAPHY (ERCP);  Surgeon: Midge Minium, MD;  Location: [facility] ENDOSCOPY;  Service: Endoscopy;  Laterality: N/A;    Family Psychiatric History: I have reviewed family psychiatric history from progress note on 07/07/2022.  Family History:  Family History  Problem Relation Age of  Onset   Drug abuse Mother    Arthritis Mother    Thyroid disease Mother    Depression Mother    Anxiety disorder Mother    Suicidality Mother    ALS Maternal Grandfather    Anxiety disorder Maternal Grandmother    Depression Maternal Grandmother    COPD Maternal Grandmother    ADD / ADHD Son    Breast cancer Neg Hx    Colon cancer Neg Hx     Social History: I have reviewed social history from progress note on 07/07/2022. Social History   Socioeconomic History   Marital  status: Significant Other    Spouse name: Not on file   Number of children: 3   Years of education: Not on file   Highest education level: 12th grade  Occupational History   Occupation: SAHM  Tobacco Use   Smoking status: Former    Current packs/day: 0.50    Average packs/day: 0.5 packs/day for 8.0 years (4.0 ttl pk-yrs)    Types: Cigarettes   Smokeless tobacco: Never  Vaping Use   Vaping status: Never Used  Substance and Sexual Activity   Alcohol use: Yes    Comment: social   Drug use: Yes    Types: Marijuana, Cocaine   Sexual activity: Yes    Partners: Male    Birth control/protection: I.U.D.  Other Topics Concern   Not on file  Social History Narrative   Not on file   Social Determinants of Health   Financial Resource Strain: High Risk (12/07/2022)   Overall Financial Resource Strain (CARDIA)    Difficulty of Paying Living Expenses: Hard  Food Insecurity: Food Insecurity Present (12/07/2022)   Hunger Vital Sign    Worried About Running Out of Food in the Last Year: Often true    Ran Out of Food in the Last Year: Sometimes true  Transportation Needs: Unmet Transportation Needs (12/07/2022)   PRAPARE - Administrator, Civil Service (Medical): Yes    Lack of Transportation (Non-Medical): No  Physical Activity: Insufficiently Active (12/07/2022)   Exercise Vital Sign    Days of Exercise per Week: 2 days    Minutes of Exercise per Session: 30 min  Stress: Stress Concern Present (12/07/2022)   [employer] of Occupational Health - Occupational Stress Questionnaire    Feeling of Stress : To some extent  Social Connections: Moderately Isolated (12/07/2022)   Social Connection and Isolation Panel [NHANES]    Frequency of Communication with Friends and Family: More than three times a week    Frequency of Social Gatherings with Friends and Family: Once a week    Attends Religious Services: Never    Database administrator or Organizations: No    Attends Museum/gallery exhibitions officer: Not on file    Marital Status: Living with partner    Allergies:  Allergies  Allergen Reactions   Sulfa Antibiotics Other (See Comments)    Reaction: unknown.  Told allergic as child but believes she's taken since.   Zithromax [Azithromycin] Other (See Comments)    Reaction: unknown Told reaction as a child    Metabolic Disorder Labs: Lab Results  Component Value Date   HGBA1C 5.3 06/10/2021   No results found for: "PROLACTIN" No results found for: "CHOL", "TRIG", "HDL", "CHOLHDL", "VLDL", "LDLCALC" Lab Results  Component Value Date   TSH 1.795 07/07/2022   TSH 0.995 01/17/2021    Therapeutic Level Labs: No results found for: "LITHIUM" No results found for: "  VALPROATE" No results found for: "CBMZ"  Current Medications: Current Outpatient Medications  Medication Sig Dispense Refill   ibuprofen (ADVIL) 600 MG tablet Take 1 tablet (600 mg total) by mouth every 6 (six) hours as needed for moderate pain. 30 tablet 0   levonorgestrel (KYLEENA) 19.5 MG IUD 1 each by Intrauterine route once.     acetaminophen (TYLENOL) 325 MG tablet Take 650 mg by mouth every 6 (six) hours as needed for moderate pain or headache.     lamoTRIgine (LAMICTAL) 25 MG tablet Take 3 tablets (75 mg total) by mouth daily. 90 tablet 1   No current facility-administered medications for this visit.     Musculoskeletal: Strength & Muscle Tone: within normal limits Gait & Station: normal Patient leans: N/A  Psychiatric Specialty Exam: Review of Systems  Psychiatric/Behavioral:  The patient is nervous/anxious.     Blood pressure (!) 139/91, pulse 92, temperature 98.1 F (36.7 C), temperature source Skin, height 5\' 3"  (1.6 m), weight 235 lb 12.8 oz (107 kg), not currently breastfeeding.Body mass index is 41.77 kg/m.  General Appearance: Fairly Groomed  Eye Contact:  Fair  Speech:  Clear and Coherent  Volume:  Normal  Mood:  Anxious  Affect:  Congruent  Thought Process:   Goal Directed and Descriptions of Associations: Intact  Orientation:  Full (Time, Place, and Person)  Thought Content: Logical   Suicidal Thoughts:  No  Homicidal Thoughts:  No  Memory:  Immediate;   Fair Recent;   Fair Remote;   Fair  Judgement:  Fair  Insight:  Fair  Psychomotor Activity:  Normal  Concentration:  Concentration: Fair and Attention Span: Fair  Recall:  [employer] of Knowledge: Fair  Language: Fair  Akathisia:  No  Handed:  Right  AIMS (if indicated): not done  Assets:  Communication Skills Desire for Improvement Housing Intimacy Social Support  ADL's:  Intact  Cognition: WNL  Sleep:   Restless   Screenings: GAD-7    Flowsheet Row Office Visit from 10/21/2022 in [facility] Office Visit from 09/02/2022 in [facility] Office Visit from 07/07/2022 in [facility] Video Visit from 05/05/2022 in [facility] Office Visit from 08/05/2021 in [facility]  Total GAD-7 Score 10 6 11 16 9       PHQ2-9    Flowsheet Row Office Visit from 10/21/2022 in [facility] Counselor from 09/23/2022 in [facility] Office Visit from 09/02/2022 in [facility] Office Visit from 07/07/2022 in [facility] Video Visit from 03/18/2022 in [facility]  PHQ-2 Total Score 1 1 0 1 2  PHQ-9 Total Score 8 6 8 9 10       Flowsheet Row ED from 04/12/2023 in [facility] at [facility] ED from 02/04/2023 in [facility] at [facility] Visit from 10/21/2022 in [facility]  C-SSRS RISK CATEGORY No Risk No Risk No Risk        Assessment and Plan: Terri [name] is a 27 year old  Caucasian female, lives with her fianc, has a history of bipolar disorder, social anxiety was evaluated in office today.  Patient has been noncompliant with follow-up appointments, currently returns with anxiety symptoms, sleep problems, will benefit from medication readjustment, also psychotherapy referral.  Plan as noted below.  Plan  Bipolar disorder type I mixed mild in remission Lamotrigine 75 mg p.o. daily  Social sightly disorder-unstable Patient will benefit from establishing care with a therapist, provided resources in the community, patient also to establish care with the therapist by contacting her health insurance plan. Discontinue Paxil for side effects. We will consider starting low dose of nortriptyline at bedtime.  However patient will need an EKG prior to that.  High risk medication use-will order EKG.  Nortriptyline can have an effect on heart/patient to call [phone] get EKG completed.  Tobacco use disorder in remission Patient quit smoking 2 months ago.     Collaboration of Care: Collaboration of Care: Referral or follow-up with counselor/therapist AEB patient encouraged to establish care with a therapist.  Patient/Guardian was advised Release of Information must be obtained prior to any record release in order to collaborate their care with an outside provider. Patient/Guardian was advised if they have not already done so to contact the registration department to sign all necessary forms in order for [place] to release information regarding their care.   Consent: Patient/Guardian gives verbal consent for treatment and assignment of benefits for services provided during this visit. Patient/Guardian expressed understanding and agreed to proceed.   Follow-up in clinic in 6 to 8 weeks or sooner if needed.  This note was generated in part or whole with voice recognition software. Voice recognition is usually quite accurate but there are transcription errors that can and very  often do occur. I apologize for any typographical errors that were not detected and corrected.    Jomarie Longs, MD 05/05/2023, 3:31 PM

## 2023-05-05 NOTE — Patient Instructions (Signed)
Please call for EKG - [phone]    Nortriptyline Capsules What is this medication? NORTRIPTYLINE (nor TRIP ti leen) treats depression. It increases the amount of serotonin and norepinephrine in the brain, substances that help regulate mood. It belongs to a group of medications called tricyclic antidepressants (TCAs). This medicine may be used for other purposes; ask your health care provider or pharmacist if you have questions. COMMON BRAND NAME(S): Aventyl, Pamelor What should I tell my care team before I take this medication? They need to know if you have any of these conditions: Bipolar disorder Brugada syndrome Frequently drink alcohol Glaucoma Heart or blood vessel conditions History of heart attack or stroke Liver disease Schizophrenia Seizures Suicidal thoughts, plans, or attempt by you or a family member Thyroid disease Trouble passing urine An unusual or allergic reaction to nortriptyline, other medications, foods, dyes, or preservatives Pregnant or trying to get pregnant Breastfeeding How should I use this medication? Take this medication by mouth with a glass of water. Follow the directions on the prescription label. Take your doses at regular intervals. Do not take it more often than directed. Do not stop taking this medication suddenly except upon the advice of your care team. Stopping this medication too quickly may cause serious side effects or your condition may worsen. A special MedGuide will be given to you by the pharmacist with each prescription and refill. Be sure to read this information carefully each time. Talk to your care team about the use of this medication in children. Special care may be needed. Overdosage: If you think you have taken too much of this medicine contact a poison control center or emergency room at once. NOTE: This medicine is only for you. Do not share this medicine with others. What if I miss a dose? If you miss a dose, take it as  soon as you can. If it is almost time for your next dose, take only that dose. Do not take double or extra doses. What may interact with this medication? Do not take this medication with any of the following: Cisapride Dronedarone Linezolid MAOIs, such as Marplan, Nardil, and Parnate Methylene blue (injected into a vein) Pimozide Thioridazine This medication may also interact with the following: Alcohol Antihistamines for allergy, cough, and cold Atropine Buspirone Certain medications for bladder problems, such as oxybutynin or tolterodine Certain medications for depression, such as amitriptyline, fluoxetine, sertraline Certain medications for headaches, such as sumatriptan or rizatriptan Certain medications for Parkinson disease, such as benztropine or trihexyphenidyl Certain medications for stomach problems, such as dicyclomine or hyoscyamine Certain medications for travel sickness, such as scopolamine Chlorpropamide Cimetidine Fentanyl Ipratropium Lithium Other medications that cause heart rhythm changes, such as dofetilide Quinidine Reserpine St. John's wort Thyroid medication Tramadol Tryptophan This list may not describe all possible interactions. Give your health care provider a list of all the medicines, herbs, non-prescription drugs, or dietary supplements you use. Also tell them if you smoke, drink alcohol, or use illegal drugs. Some items may interact with your medicine. What should I watch for while using this medication? Visit your care team for regular checks on your progress. It may be some time before you see the benefit from this medication. This medication may cause thoughts of suicide or depression. This includes sudden changes in mood, behaviors, or thoughts. These changes can happen at any time but are more common in the beginning of treatment or after a change in dose. Call your care team right away if you experience these  thoughts or worsening  depression. This medication may affect your coordination, reaction time, or judgment. Do not drive or operate machinery until you know how this medication affects you. Sit up or stand slowly to reduce the risk of dizzy or fainting spells. Drinking alcohol with this medication can increase the risk of these side effects. Your mouth may get dry. Chewing sugarless gum or sucking hard candy and drinking plenty of water may help. Contact your care team if the problem does not go away or is severe. This medication may cause dry eyes and blurred vision. If you wear contact lenses, you may feel some discomfort. Lubricating eye drops may help. See your care team if the problem does not go away or is severe. This medication will cause constipation. If you do not have a bowel movement for 3 days, call your care team. This medication can make you more sensitive to the sun. Keep out of the sun. If you cannot avoid being in the sun, wear protective clothing and sunscreen. Do not use sun lamps, tanning beds, or tanning booths. What side effects may I notice from receiving this medication? Side effects that you should report to your care team as soon as possible: Allergic reactions--skin rash, itching, hives, swelling of the face, lips, tongue, or throat Heart rhythm changes--fast or irregular heartbeat, dizziness, feeling faint or lightheaded, chest pain, trouble breathing Irritability, confusion, fast or irregular heartbeat, muscle stiffness, twitching muscles, sweating, high fever, seizure, chills, vomiting, diarrhea, which may be signs of serotonin syndrome Seizures Sudden eye pain or change in vision such as blurry vision, seeing halos around lights, vision loss Thoughts of suicide or self-harm, worsening mood, feelings of depression Trouble passing urine Side effects that usually do not require medical attention (report to your care team if they continue or are bothersome): Change in sex drive or  performance Constipation Dizziness Drowsiness Dry mouth Tremors or shaking This list may not describe all possible side effects. Call your doctor for medical advice about side effects. You may report side effects to FDA at 1-800-FDA-1088. Where should I keep my medication? Keep out of the reach of children. Store at room temperature between 15 and 30 degrees C (59 and 86 degrees F). Keep container tightly closed. Throw away any unused medication after the expiration date. NOTE: This sheet is a summary. It may not cover all possible information. If you have questions about this medicine, talk to your doctor, pharmacist, or health care provider.  2024 Elsevier/Gold Standard (2022-11-25 00:00:00)

## 2023-05-06 ENCOUNTER — Telehealth: Payer: Self-pay

## 2023-05-06 DIAGNOSIS — F3178 Bipolar disorder, in full remission, most recent episode mixed: Secondary | ICD-10-CM

## 2023-05-06 DIAGNOSIS — F4010 Social phobia, unspecified: Secondary | ICD-10-CM

## 2023-05-06 MED ORDER — NORTRIPTYLINE HCL 10 MG PO CAPS: 10.0000 mg | ORAL_CAPSULE | Freq: Every day | ORAL | 0 refills | Status: DC

## 2023-05-06 NOTE — Telephone Encounter (Signed)
pt called status of the ekg she had done yesterday. pt was last seen yesterday and next appt 10-11

## 2023-05-06 NOTE — Telephone Encounter (Signed)
I have reviewed EKG dated 05/05/2023-normal sinus rhythm.  Will start nortriptyline 10 mg at bedtime as discussed at her visit.  Patient was provided medication education.

## 2023-05-10 NOTE — Telephone Encounter (Signed)
pt was called she stated that she seen message in mychart she taking the medication and it seems to be working good. no issues with medication so far.

## 2023-05-26 ENCOUNTER — Ambulatory Visit
Admission: EM | Admit: 2023-05-26 | Discharge: 2023-05-26 | Disposition: A | Discharge status: Home / Self Care | Payer: Medicaid Other | Attending: Emergency Medicine | Admitting: Emergency Medicine

## 2023-05-26 DIAGNOSIS — R0602 Shortness of breath: Secondary | ICD-10-CM

## 2023-05-26 DIAGNOSIS — B349 Viral infection, unspecified: Secondary | ICD-10-CM | POA: Diagnosis not present

## 2023-05-26 MED ORDER — VENTOLIN HFA 108 (90 BASE) MCG/ACT IN AERS: 1.0000 | INHALATION_SPRAY | Freq: Four times a day (QID) | RESPIRATORY_TRACT | 0 refills | Status: DC | PRN

## 2023-05-26 NOTE — ED Provider Notes (Signed)
Terri [name]    CSN: [id number] Arrival date & time: 05/26/23  1803      History   Chief Complaint Chief Complaint  Patient presents with   Shortness of Breath   Sore Throat   Headache    HPI Terri [name] is a 27 y.o. female.  Patient presents with cough, wheezing at night, shortness of breath, headache x 10 days.  No fever, chest pain, vomiting, diarrhea, or other symptoms.  Treatment attempted with OTC cold medication.  Her medical history includes bipolar disorder.  The history is provided by the patient and medical records.    Past Medical History:  Diagnosis Date   Anxiety    No pertinent past medical history    Pseudotumor cerebri     Patient Active Problem List   Diagnosis Date Noted   High risk medication use 05/05/2023   Social anxiety disorder 07/07/2022   Cocaine use disorder, moderate, in sustained remission (HCC) 07/07/2022   Cannabis use disorder, mild, in sustained remission 07/07/2022   Obesity during pregnancy, antepartum 06/24/2021   Insufficient prenatal care 06/24/2021   Late prenatal care affecting pregnancy in third trimester 06/24/2021   Marijuana use during pregnancy 06/24/2021   Left hand pain 10/30/2020   Arthralgia of both knees and bilateral hands.  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    Past Surgical History:  Procedure Laterality Date   CHOLECYSTECTOMY N/A 11/04/2018   Procedure: LAPAROSCOPIC CHOLECYSTECTOMY;  Surgeon: Henrene Dodge, MD;  Location: [facility] ORS;   Service: General;  Laterality: N/A;   DILATION AND EVACUATION N/A 07/13/2017   Procedure: DILATATION AND EVACUATION;  Surgeon: Ward, Elenora Fender, MD;  Location: [facility] ORS;  Service: Gynecology;  Laterality: N/A;   ERCP N/A 11/14/2018   Procedure: ENDOSCOPIC RETROGRADE CHOLANGIOPANCREATOGRAPHY (ERCP);  Surgeon: Midge Minium, MD;  Location: [facility] ENDOSCOPY;  Service: Endoscopy;  Laterality: N/A;    OB History     Gravida  5   Para  3   Term  3   Preterm      AB  2   Living  3      SAB  1   IAB      Ectopic      Multiple  0   Live Births  3            Home Medications    Prior to Admission medications   Medication Sig Start Date End Date Taking? Authorizing Provider  albuterol (VENTOLIN HFA) 108 (90 Base) MCG/ACT inhaler Inhale 1-2 puffs into the lungs every 6 (six) hours as needed for wheezing or shortness of breath. 05/26/23  Yes Mickie Bail, NP  acetaminophen (TYLENOL) 325 MG tablet Take 650 mg by mouth every 6 (six) hours as needed for moderate pain or headache.    [provider]  ibuprofen (ADVIL) 600 MG tablet Take 1 tablet (600 mg total) by mouth every 6 (six) hours as needed for moderate pain. 04/12/23   Evon Slack, PA-C  lamoTRIgine (LAMICTAL) 25 MG tablet Take 3 tablets (75 mg total) by mouth daily.  05/05/23   Jomarie Longs, MD  levonorgestrel (KYLEENA) 19.5 MG IUD 1 each by Intrauterine route once.    [provider]  nortriptyline (PAMELOR) 10 MG capsule Take 1 capsule (10 mg total) by mouth at bedtime. 05/06/23   Jomarie Longs, MD    Family History Family History  Problem Relation Age of Onset   Drug abuse Mother    Arthritis Mother    Thyroid disease Mother    Depression Mother    Anxiety disorder Mother    Suicidality Mother    ALS Maternal Grandfather    Anxiety disorder Maternal Grandmother    Depression Maternal Grandmother    COPD Maternal Grandmother    ADD / ADHD Son    Breast cancer Neg Hx    Colon cancer Neg Hx      Social History Social History   Tobacco Use   Smoking status: Former    Current packs/day: 0.50    Average packs/day: 0.5 packs/day for 8.0 years (4.0 ttl pk-yrs)    Types: Cigarettes   Smokeless tobacco: Never  Vaping Use   Vaping status: Never Used  Substance Use Topics   Alcohol use: Yes    Comment: social   Drug use: Yes    Types: Marijuana, Cocaine     Allergies   Sulfa antibiotics and Zithromax [azithromycin]   Review of Systems Review of Systems  Constitutional:  Negative for chills and fever.  HENT:  Positive for congestion. Negative for ear pain and sore throat.   Respiratory:  Positive for cough, shortness of breath and wheezing.   Cardiovascular:  Negative for chest pain and palpitations.  Gastrointestinal:  Negative for diarrhea and vomiting.  Neurological:  Positive for headaches.     Physical Exam Triage Vital Signs ED Triage Vitals  Encounter Vitals Group     BP      Systolic BP Percentile      Diastolic BP Percentile      Pulse      Resp      Temp      Temp src      SpO2      Weight      Height      Head Circumference      Peak Flow      Pain Score      Pain Loc      Pain Education      Exclude from Growth Chart    No data found.  Updated Vital Signs BP 132/68   Pulse 99   Temp 99.3 F (37.4 C)   Resp 18   SpO2 96%   Visual Acuity Right Eye Distance:   Left Eye Distance:   Bilateral Distance:    Right Eye Near:   Left Eye Near:    Bilateral Near:     Physical Exam Constitutional:      General: She is not in acute distress. HENT:     Right Ear: Tympanic membrane normal.     Left Ear: Tympanic membrane normal.     Nose: Nose normal.     Mouth/Throat:     Mouth: Mucous membranes are moist.     Pharynx: Oropharynx is clear.  Cardiovascular:     Rate and Rhythm: Normal rate and regular rhythm.     Heart sounds: Normal heart sounds.  Pulmonary:     Effort: Pulmonary effort is normal. No respiratory distress.      Breath sounds: Normal breath sounds.  Skin:  General: Skin is warm and dry.  Neurological:     Mental Status: She is alert.      UC Treatments / Results  Labs (all labs ordered are listed, but only abnormal results are displayed) Labs Reviewed - No data to display  EKG   Radiology No results found.  Procedures Procedures (including critical care time)  Medications Ordered in UC Medications - No data to display  Initial Impression / Assessment and Plan / UC Course  I have reviewed the triage vital signs and the nursing notes.  Pertinent labs & imaging results that were available during my care of the patient were reviewed by me and considered in my medical decision making (see chart for details).    Viral illness.  Afebrile and vital signs are stable.  Lungs are clear, O2 sat 96% on room air.  No respiratory distress.  Patient reports she is having wheezing and shortness of breath at night.  Treating today with albuterol inhaler.  Instructed her to follow-up with her PCP tomorrow.  ED precautions given.  Education provided on viral illness and shortness of breath.  She agrees to plan of care.  Final Clinical Impressions(s) / UC Diagnoses   Final diagnoses:  Viral illness  Shortness of breath     Discharge Instructions      Use the albuterol inhaler as directed.  Follow up with your primary care provider tomorrow.  Go to the emergency department if you have worsening symptoms.        ED Prescriptions     Medication Sig Dispense Auth. Provider   albuterol (VENTOLIN HFA) 108 (90 Base) MCG/ACT inhaler Inhale 1-2 puffs into the lungs every 6 (six) hours as needed for wheezing or shortness of breath. 54 g Mickie Bail, NP      PDMP not reviewed this encounter.   Mickie Bail, NP 05/26/23 [phone]

## 2023-05-26 NOTE — Discharge Instructions (Addendum)
Use the albuterol inhaler as directed.  Follow up with your primary care provider tomorrow.  Go to the emergency department if you have worsening symptoms.

## 2023-05-26 NOTE — ED Triage Notes (Signed)
Patient to Urgent Care with complaints of couh/ shortness of breath/ headaches. Neck and chest discomfort from coughing.  Reports symptoms started approx 10 days ago  Reports last night having an episode of SHOB. Has been wheezing. No hx of asthma.

## 2023-05-28 ENCOUNTER — Emergency Department
Admission: EM | Admit: 2023-05-28 | Discharge: 2023-05-28 | Disposition: A | Discharge status: Home / Self Care | Payer: Medicaid Other | Attending: Emergency Medicine | Admitting: Emergency Medicine

## 2023-05-28 ENCOUNTER — Emergency Department: Payer: Medicaid Other

## 2023-05-28 ENCOUNTER — Other Ambulatory Visit: Payer: Self-pay

## 2023-05-28 DIAGNOSIS — J069 Acute upper respiratory infection, unspecified: Secondary | ICD-10-CM | POA: Insufficient documentation

## 2023-05-28 DIAGNOSIS — R059 Cough, unspecified: Secondary | ICD-10-CM | POA: Diagnosis not present

## 2023-05-28 DIAGNOSIS — Z20822 Contact with and (suspected) exposure to covid-19: Secondary | ICD-10-CM | POA: Diagnosis not present

## 2023-05-28 LAB — RESP PANEL BY RT-PCR (RSV, FLU A&B, COVID)  RVPGX2
Influenza A by PCR: NEGATIVE
Influenza B by PCR: NEGATIVE
Resp Syncytial Virus by PCR: NEGATIVE
SARS Coronavirus 2 by RT PCR: NEGATIVE

## 2023-05-28 LAB — GROUP A STREP BY PCR: Group A Strep by PCR: NOT DETECTED

## 2023-05-28 MED ORDER — BENZONATATE 100 MG PO CAPS
100.0000 mg | ORAL_CAPSULE | Freq: Once | ORAL | Status: AC
  Administered 2023-05-28: 100 mg via ORAL
  Filled 2023-05-28: qty 1

## 2023-05-28 MED ORDER — DOXYCYCLINE HYCLATE 100 MG PO TABS
100.0000 mg | ORAL_TABLET | Freq: Once | ORAL | Status: AC
  Administered 2023-05-28: 100 mg via ORAL
  Filled 2023-05-28: qty 1

## 2023-05-28 MED ORDER — DOXYCYCLINE HYCLATE 100 MG PO CAPS: 100.0000 mg | ORAL_CAPSULE | Freq: Two times a day (BID) | ORAL | 0 refills | Status: DC

## 2023-05-28 MED ORDER — BENZONATATE 100 MG PO CAPS: 100.0000 mg | ORAL_CAPSULE | Freq: Three times a day (TID) | ORAL | 0 refills | Status: DC | PRN

## 2023-05-28 NOTE — ED Triage Notes (Signed)
Pt to ed from home via POV for cough and sore throat x 2 weeks. Pt was just seen at [facility] UC on 9/25 and they didn't test pt for anything. Pt is caox4, in no acute distress and ambulatory in triage.

## 2023-05-28 NOTE — Discharge Instructions (Addendum)
You may alternate Tylenol 1000 mg every 6 hours as needed for pain, fever and Ibuprofen 800 mg every 6-8 hours as needed for pain, fever.  Please take Ibuprofen with food.  Do not take more than 4000 mg of Tylenol (acetaminophen) in a 24 hour period. ° °

## 2023-05-28 NOTE — ED Provider Notes (Signed)
[facility] Provider Note    Event Date/Time   First MD Initiated Contact with Patient 05/28/23 2301     (approximate)   History   Sore Throat (X 2 weeks)   HPI  Terri [name] is a 27 y.o. female with history of anxiety, pseudotumor cerebri who presents to the emergency department with 2 weeks of cough, congestion, sore throat, intermittent fevers.  Reports having posttussive emesis and urinary incontinence with coughing.  States coughing is keeping her from sleeping despite taking her medications for sleep.  Was seen at urgent care for the same and was given albuterol inhaler.  She has not been on antibiotics.   History provided by patient.    Past Medical History:  Diagnosis Date   Anxiety    No pertinent past medical history    Pseudotumor cerebri     Past Surgical History:  Procedure Laterality Date   CHOLECYSTECTOMY N/A 11/04/2018   Procedure: LAPAROSCOPIC CHOLECYSTECTOMY;  Surgeon: Henrene Dodge, MD;  Location: [facility] ORS;  Service: General;  Laterality: N/A;   DILATION AND EVACUATION N/A 07/13/2017   Procedure: DILATATION AND EVACUATION;  Surgeon: Wojciech Willetts, Elenora Fender, MD;  Location: [facility] ORS;  Service: Gynecology;  Laterality: N/A;   ERCP N/A 11/14/2018   Procedure: ENDOSCOPIC RETROGRADE CHOLANGIOPANCREATOGRAPHY (ERCP);  Surgeon: Midge Minium, MD;  Location: [facility] ENDOSCOPY;  Service: Endoscopy;  Laterality: N/A;    MEDICATIONS:  Prior to Admission medications   Medication Sig Start Date End Date Taking? Authorizing Provider  acetaminophen (TYLENOL) 325 MG tablet Take 650 mg by mouth every 6 (six) hours as needed for moderate pain or headache.    [provider]  albuterol (VENTOLIN HFA) 108 (90 Base) MCG/ACT inhaler Inhale 1-2 puffs into the lungs every 6 (six) hours as needed for wheezing or shortness of breath. 05/26/23   Mickie Bail, NP  ibuprofen (ADVIL) 600 MG tablet Take 1 tablet (600 mg total) by mouth every 6 (six) hours as  needed for moderate pain. 04/12/23   Evon Slack, PA-C  lamoTRIgine (LAMICTAL) 25 MG tablet Take 3 tablets (75 mg total) by mouth daily. 05/05/23   Jomarie Longs, MD  levonorgestrel (KYLEENA) 19.5 MG IUD 1 each by Intrauterine route once.    [provider]  nortriptyline (PAMELOR) 10 MG capsule Take 1 capsule (10 mg total) by mouth at bedtime. 05/06/23   Jomarie Longs, MD    Physical Exam   Triage Vital Signs: ED Triage Vitals  Encounter Vitals Group     BP 05/28/23 2033 (!) 136/101     Systolic BP Percentile --      Diastolic BP Percentile --      Pulse Rate 05/28/23 2033 (!) 102     Resp 05/28/23 2033 20     Temp 05/28/23 2033 98.6 F (37 C)     Temp Source 05/28/23 2033 Oral     SpO2 05/28/23 2033 99 %     Weight 05/28/23 2034 231 lb 7.7 oz (105 kg)     Height 05/28/23 2034 5\' 3"  (1.6 m)     Head Circumference --      Peak Flow --      Pain Score 05/28/23 2034 5     Pain Loc --      Pain Education --      Exclude from Growth Chart --     Most recent vital signs: Vitals:   05/28/23 2033  BP: (!) 136/101  Pulse: (!) 102  Resp: 20  Temp: 98.6 F (37 C)  SpO2: 99%    CONSTITUTIONAL: Alert, responds appropriately to questions. Well-appearing; well-nourished HEAD: Normocephalic, atraumatic EYES: Conjunctivae clear, pupils appear equal, sclera nonicteric ENT: normal nose; moist mucous membranes, slightly erythematous posterior oropharynx but no tonsillar hypertrophy or exudate, uvula is midline without swelling, no trismus or drooling, normal phonation NECK: Supple, normal ROM CARD: RRR; S1 and S2 appreciated RESP: Normal chest excursion without splinting or tachypnea; breath sounds clear and equal bilaterally; no wheezes, no rhonchi, no rales, no hypoxia or respiratory distress, speaking full sentences ABD/GI: Non-distended; soft, non-tender, no rebound, no guarding, no peritoneal signs BACK: The back appears normal EXT: Normal ROM in all joints; no  deformity noted, no edema SKIN: Normal color for age and race; warm; no rash on exposed skin NEURO: Moves all extremities equally, normal speech PSYCH: The patient's mood and manner are appropriate.   ED Results / Procedures / Treatments   LABS: (all labs ordered are listed, but only abnormal results are displayed) Labs Reviewed  RESP PANEL BY RT-PCR (RSV, FLU A&B, COVID)  [id number]  GROUP A STREP BY PCR     EKG:   RADIOLOGY: My personal review and interpretation of imaging: Chest x-ray clear.  I have personally reviewed all radiology reports.   DG Chest 1 View  Result Date: 05/28/2023 CLINICAL DATA:  Cough EXAM: CHEST  1 VIEW COMPARISON:  05/30/2021 FINDINGS: The heart size and mediastinal contours are within normal limits. Both lungs are clear. The visualized skeletal structures are unremarkable. IMPRESSION: No active disease. Electronically Signed   By: Jasmine Pang M.D.   On: 05/28/2023 20:59     PROCEDURES:  Critical Care performed: No   CRITICAL CARE Performed by: Baxter Hire Shanyia Stines   Total critical care time: 0 minutes  Critical care time was exclusive of separately billable procedures and treating other patients.  Critical care was necessary to treat or prevent imminent or life-threatening deterioration.  Critical care was time spent personally by me on the following activities: development of treatment plan with patient and/or surrogate as well as nursing, discussions with consultants, evaluation of patient's response to treatment, examination of patient, obtaining history from patient or surrogate, ordering and performing treatments and interventions, ordering and review of laboratory studies, ordering and review of radiographic studies, pulse oximetry and re-evaluation of patient's condition.   Procedures    IMPRESSION / MDM / ASSESSMENT AND PLAN / ED COURSE  I reviewed the triage vital signs and the nursing notes.    Patient here for upper respiratory  symptoms ongoing for 2 weeks.     DIFFERENTIAL DIAGNOSIS (includes but not limited to):   Viral illness, pneumonia, strep pharyngitis, doubt tonsillitis, uvulitis, deep space neck infection, PTA, meningitis   Patient's presentation is most consistent with acute complicated illness / injury requiring diagnostic workup.   PLAN: Patient is negative for COVID, flu, RSV, strep.  Chest x-ray reviewed and interpreted by myself and the radiologist and is clear.  Given symptoms ongoing for 2 weeks with 2 weeks for the fever, discussed starting her on antibiotics to cover for possible bacterial sources which she is in agreements with.  Her lungs are clear to auscultation and she is well-appearing here.  Discussed supportive care instructions and over-the-counter medications for symptomatic relief.  Will discharge with Tessalon Perles.  Recommended follow-up with her PCP if symptoms not improving on antibiotics.   MEDICATIONS GIVEN IN ED: Medications  doxycycline (VIBRA-TABS) tablet 100 mg (100 mg Oral Given  05/28/23 2330)  benzonatate (TESSALON) capsule 100 mg (100 mg Oral Given 05/28/23 2330)     ED COURSE:  At this time, I do not feel there is any life-threatening condition present. I reviewed all nursing notes, vitals, pertinent previous records.  All lab and urine results, EKGs, imaging ordered have been independently reviewed and interpreted by myself.  I reviewed all available radiology reports from any imaging ordered this visit.  Based on my assessment, I feel the patient is safe to be discharged home without further emergent workup and can continue workup as an outpatient as needed. Discussed all findings, treatment plan as well as usual and customary return precautions.  They verbalize understanding and are comfortable with this plan.  Outpatient follow-up has been provided as needed.  All questions have been answered.    CONSULTS:  none   OUTSIDE RECORDS REVIEWED: Reviewed previous  neurology note in February 2021.       FINAL CLINICAL IMPRESSION(S) / ED DIAGNOSES   Final diagnoses:  Upper respiratory tract infection, unspecified type     Rx / DC Orders   ED Discharge Orders          Ordered    doxycycline (VIBRAMYCIN) 100 MG capsule  2 times daily        05/28/23 2317    benzonatate (TESSALON PERLES) 100 MG capsule  3 times daily PRN        05/28/23 2317             Note:  This document was prepared using Dragon voice recognition software and may include unintentional dictation errors.   Sydnee Lamour, Layla Maw, DO 05/29/23 [phone]

## 2023-06-03 ENCOUNTER — Ambulatory Visit: Payer: Medicaid Other | Admitting: Family Medicine

## 2023-06-03 ENCOUNTER — Encounter: Payer: Self-pay | Admitting: Family Medicine

## 2023-06-03 VITALS — BP 126/71 | HR 98 | Temp 98.4°F | Resp 20 | Ht 63.0 in | Wt 234.3 lb

## 2023-06-03 DIAGNOSIS — R052 Subacute cough: Secondary | ICD-10-CM | POA: Diagnosis not present

## 2023-06-03 MED ORDER — PREDNISONE 20 MG PO TABS: 40.0000 mg | ORAL_TABLET | Freq: Every day | ORAL | 0 refills | Status: AC

## 2023-06-03 NOTE — Progress Notes (Signed)
Acute Office Visit  Subjective:     Patient ID: Terri [name], female    DOB: [birth year], 27 y.o.   MRN: [id number]  Chief Complaint  Patient presents with   Cough    X3 weeks   Nasal Congestion   Shortness of Breath    HPI Discussed the use of AI scribe software for clinical note transcription with the patient, who gave verbal consent to proceed.  History of Present Illness   The patient, with a history of smoking and recent transition to vaping, presents with a persistent cough and difficulty breathing for approximately three weeks. The symptoms have progressively worsened, with the patient describing it as "uncomfortable" during the day and severe enough to cause crying and vomiting at night due to the intensity of the cough. The patient has been unable to lay down for more than twenty seconds without coughing. The patient also reports sinus congestion and loss of taste, likely due to the sinus congestion.  The patient sought medical attention at an urgent care facility and a hospital, where she was tested for strep, COVID, and other common viruses, all of which were negative. A chest x-ray was also performed, which did not show any signs of pneumonia. The patient was prescribed an inhaler and doxycycline, but these treatments have not improved the symptoms. The patient also reports ear pain, which is likely due to sinus pressure.       ROS per HPI      Objective:    BP 126/71 (BP Location: Right Arm, Patient Position: Sitting, Cuff Size: Large)   Pulse 98   Temp 98.4 F (36.9 C) (Oral)   Resp 20   Ht 5\' 3"  (1.6 m) Comment: per patient  Wt 234 lb 4.8 oz (106.3 kg)   SpO2 100%   BMI 41.50 kg/m    Physical Exam Vitals reviewed.  Constitutional:      General: She is not in acute distress.    Appearance: Normal appearance. She is well-developed. She is not diaphoretic.  HENT:     Head: Normocephalic and atraumatic.     Right Ear: Tympanic membrane, ear canal  and external ear normal.     Left Ear: Tympanic membrane, ear canal and external ear normal.     Nose: Nose normal.     Mouth/Throat:     Mouth: Mucous membranes are moist.     Pharynx: Oropharynx is clear. No oropharyngeal exudate.  Eyes:     General: No scleral icterus.    Conjunctiva/sclera: Conjunctivae normal.     Pupils: Pupils are equal, round, and reactive to light.  Cardiovascular:     Rate and Rhythm: Normal rate and regular rhythm.     Pulses: Normal pulses.     Heart sounds: Normal heart sounds. No murmur heard. Pulmonary:     Effort: Pulmonary effort is normal. No respiratory distress.     Breath sounds: Normal breath sounds. No wheezing or rales.  Musculoskeletal:     Cervical back: Neck supple.     Right lower leg: No edema.     Left lower leg: No edema.  Lymphadenopathy:     Cervical: No cervical adenopathy.  Skin:    General: Skin is warm and dry.     Findings: No rash.  Neurological:     Mental Status: She is alert.     No results found for any visits on 06/03/23.      Assessment & Plan:   Problem List Items  Addressed This Visit   None Visit Diagnoses     Subacute cough    -  Primary           Persistent Cough Severe cough for three weeks, worse at night, causing vomiting. No improvement with doxycycline. Negative for pneumonia, COVID, flu, RSV, and strep. Recent change from smoking to vaping. Suspected inflammatory process, possibly asthmatic bronchitis. -Start Prednisone 40mg  daily for 7 days, taken in the morning with food. -Continue supportive care with Tylenol, sinus, cold and flu medications, Zyrtec, Tessalon Perles, and Flonase as needed. -Avoid concurrent use of ibuprofen with Prednisone due to potential gastric irritation. -Contact office if no improvement with Prednisone.  General Health Maintenance No recent wellness check. -Schedule physical exam in the new year.        Meds ordered this encounter  Medications   predniSONE  (DELTASONE) 20 MG tablet    Sig: Take 2 tablets (40 mg total) by mouth daily with breakfast for 7 days.    Dispense:  14 tablet    Refill:  0    Return in about 3 months (around 09/03/2023) for CPE.  Shirlee Latch, MD

## 2023-06-12 ENCOUNTER — Other Ambulatory Visit: Payer: Self-pay | Admitting: Psychiatry

## 2023-06-12 DIAGNOSIS — F4010 Social phobia, unspecified: Secondary | ICD-10-CM

## 2023-06-14 ENCOUNTER — Ambulatory Visit: Payer: Self-pay

## 2023-06-14 NOTE — Telephone Encounter (Signed)
Please have her get seen and re-evaluated tomorrow

## 2023-06-14 NOTE — Telephone Encounter (Signed)
Appt made in office for 06/15/23

## 2023-06-14 NOTE — Telephone Encounter (Signed)
  Chief Complaint: coughing Symptoms: SOB with coughing and exertion Frequency: coughing came back 3 days after stopping Prednisone Pertinent Negatives: Patient denies Wheezing, fever, phlegm, chest pain  Disposition: [] ED /[] Urgent Care (no appt availability in office) / [] Appointment(In office/virtual)/ []  [facility]/ [] Home Care/ [] Refused Recommended Disposition /[] [place]/ [x]  Follow-up with PCP Additional Notes: NP can only do VV. No appt until tomorrow. Pt wanting another round of Prednisone called in. Called FC advised to send note over for PCP review. Reason for Disposition  [1] MILD difficulty breathing (e.g., minimal/no SOB at rest, SOB with walking, pulse <100) AND [2] NEW-onset or WORSE than normal  Answer Assessment - Initial Assessment Questions 1. RESPIRATORY STATUS: "Describe your breathing?" (e.g., wheezing, shortness of breath, unable to speak, severe coughing)      cough 2. ONSET: "When did this breathing problem begin?"      1 month ago  3. PATTERN "Does the difficult breathing come and go, or has it been constant since it started?"      Worse at night  4. SEVERITY: "How bad is your breathing?" (e.g., mild, moderate, severe)    - MILD: No SOB at rest, mild SOB with walking, speaks normally in sentences, can lie down, no retractions, pulse < 100.    - MODERATE: SOB at rest, SOB with minimal exertion and prefers to sit, cannot lie down flat, speaks in phrases, mild retractions, audible wheezing, pulse 100-120.    - SEVERE: Very SOB at rest, speaks in single words, struggling to breathe, sitting hunched forward, retractions, pulse > 120      With coughing  5. RECURRENT SYMPTOM: "Have you had difficulty breathing before?" If Yes, ask: "When was the last time?" and "What happened that time?"      3 days after taken off steroids  9. OTHER SYMPTOMS: "Do you have any other symptoms? (e.g., dizziness, runny nose, cough, chest pain, fever)      Cough  Protocols used: Breathing Difficulty-A-AH

## 2023-06-15 ENCOUNTER — Encounter: Payer: Self-pay | Admitting: Family Medicine

## 2023-06-15 ENCOUNTER — Ambulatory Visit (INDEPENDENT_AMBULATORY_CARE_PROVIDER_SITE_OTHER): Payer: Medicaid Other | Admitting: Family Medicine

## 2023-06-15 VITALS — BP 105/61 | HR 107 | Temp 97.7°F | Ht 63.6 in | Wt 237.0 lb

## 2023-06-15 DIAGNOSIS — J4541 Moderate persistent asthma with (acute) exacerbation: Secondary | ICD-10-CM

## 2023-06-15 MED ORDER — PREDNISONE 50 MG PO TABS
50.0000 mg | ORAL_TABLET | Freq: Every day | ORAL | 0 refills | Status: DC
Start: 2023-06-15 — End: 2023-08-02

## 2023-06-15 MED ORDER — BUDESONIDE-FORMOTEROL FUMARATE 160-4.5 MCG/ACT IN AERO
2.0000 | INHALATION_SPRAY | Freq: Two times a day (BID) | RESPIRATORY_TRACT | 1 refills | Status: DC
Start: 2023-06-15 — End: 2023-08-02

## 2023-06-15 NOTE — Progress Notes (Signed)
Established patient visit   Patient: Terri [name]   DOB: [birth year]   27 y.o. Female  MRN: [id number] Visit Date: 06/15/2023  Today's healthcare provider: Sherlyn Hay, DO   No chief complaint on file.  Subjective    HPI Coughing and wheezing follow-up: Helped by prednisone (40 mg x 7 days)?  Yes, completely resolved symptoms Current symptoms? Coughing (still worse at night, nonproductive), wheezing, shortness of breath Asthma Control Questionairre - 8 Still vaping.   Visit 06/03/2023 with Dr. Beryle Flock: "Severe cough for three weeks, worse at night, causing vomiting. No improvement with doxycycline. Negative for pneumonia, COVID, flu, RSV, and strep. Recent change from smoking to vaping. Suspected inflammatory process, possibly asthmatic bronchitis."     Medications: Outpatient Medications Prior to Visit  Medication Sig   acetaminophen (TYLENOL) 325 MG tablet Take 650 mg by mouth every 6 (six) hours as needed for moderate pain or headache.   albuterol (VENTOLIN HFA) 108 (90 Base) MCG/ACT inhaler Inhale 1-2 puffs into the lungs every 6 (six) hours as needed for wheezing or shortness of breath.   benzonatate (TESSALON PERLES) 100 MG capsule Take 1 capsule (100 mg total) by mouth 3 (three) times daily as needed for cough. Keep away from children.  Do not take if breastfeeding.   ibuprofen (ADVIL) 600 MG tablet Take 1 tablet (600 mg total) by mouth every 6 (six) hours as needed for moderate pain.   lamoTRIgine (LAMICTAL) 25 MG tablet Take 3 tablets (75 mg total) by mouth daily.   levonorgestrel (KYLEENA) 19.5 MG IUD 1 each by Intrauterine route once.   nortriptyline (PAMELOR) 10 MG capsule TAKE 1 CAPSULE BY MOUTH AT BEDTIME.   [DISCONTINUED] doxycycline (VIBRAMYCIN) 100 MG capsule Take 1 capsule (100 mg total) by mouth 2 (two) times daily. (Patient not taking: Reported on 06/15/2023)   No facility-administered medications prior to visit.    Review of Systems   Constitutional:  Positive for fatigue. Negative for chills and fever.  Respiratory:  Positive for cough, shortness of breath and wheezing.   Cardiovascular:  Negative for chest pain, palpitations and leg swelling.        Objective    BP 105/61 (BP Location: Left Arm, Patient Position: Sitting, Cuff Size: Large)   Pulse (!) 107   Temp 97.7 F (36.5 C) (Oral)   Ht 5' 3.6" (1.615 m)   Wt 237 lb (107.5 kg)   SpO2 97%   BMI 41.19 kg/m     Physical Exam Vitals reviewed.  Constitutional:      General: She is not in acute distress.    Appearance: Normal appearance. She is well-developed. She is not diaphoretic.  HENT:     Head: Normocephalic and atraumatic.  Eyes:     General: No scleral icterus.    Conjunctiva/sclera: Conjunctivae normal.     Pupils: Pupils are equal, round, and reactive to light.  Cardiovascular:     Rate and Rhythm: Normal rate and regular rhythm.     Pulses: Normal pulses.     Heart sounds: Normal heart sounds. No murmur heard. Pulmonary:     Effort: Pulmonary effort is normal. No respiratory distress.     Breath sounds: Wheezing (inspiratory and expiratory, more pronounced at bases.) present. No rales.  Musculoskeletal:     Right lower leg: No edema.     Left lower leg: No edema.  Skin:    General: Skin is warm and dry.     Findings: No rash.  Neurological:     Mental Status: She is alert.      No results found for any visits on 06/15/23.  Assessment & Plan    Moderate persistent asthma with acute exacerbation -     Budesonide-Formoterol Fumarate; Inhale 2 puffs into the lungs 2 (two) times daily.  Dispense: 1 each; Refill: 1 -     predniSONE; Take 1 tablet (50 mg total) by mouth daily with breakfast.  Dispense: 4 tablet; Refill: 0  Patient had complete resolution of symptoms with prednisone.  Counseled on quitting vaping.  Prescribed ICS/LABA combination as noted above, along with a 2nd short course of prednisone to get patient's symptoms  under control.    Return if symptoms worsen or fail to improve.      I discussed the assessment and treatment plan with the patient  The patient was provided an opportunity to ask questions and all were answered. The patient agreed with the plan and demonstrated an understanding of the instructions.   The patient was advised to call back or seek an in-person evaluation if the symptoms worsen or if the condition fails to improve as anticipated.    Sherlyn Hay, DO  [facility] [phone] (phone) [phone] (fax)  [facility]

## 2023-06-15 NOTE — Patient Instructions (Signed)
Can use aerochamber to help use inhaler

## 2023-06-16 ENCOUNTER — Telehealth (INDEPENDENT_AMBULATORY_CARE_PROVIDER_SITE_OTHER): Payer: Medicaid Other | Admitting: Psychiatry

## 2023-06-16 ENCOUNTER — Encounter: Payer: Self-pay | Admitting: Psychiatry

## 2023-06-16 DIAGNOSIS — F1421 Cocaine dependence, in remission: Secondary | ICD-10-CM

## 2023-06-16 DIAGNOSIS — F4010 Social phobia, unspecified: Secondary | ICD-10-CM | POA: Diagnosis not present

## 2023-06-16 DIAGNOSIS — F3178 Bipolar disorder, in full remission, most recent episode mixed: Secondary | ICD-10-CM | POA: Diagnosis not present

## 2023-06-16 DIAGNOSIS — F1211 Cannabis abuse, in remission: Secondary | ICD-10-CM

## 2023-06-16 MED ORDER — NORTRIPTYLINE HCL 10 MG PO CAPS: 20.0000 mg | ORAL_CAPSULE | Freq: Every day | ORAL | 1 refills | Status: DC

## 2023-06-16 NOTE — Progress Notes (Signed)
Virtual Visit via Video Note  I connected with Terri [name] on 06/16/23 at 10:30 AM EDT by a video enabled telemedicine application and verified that I am speaking with the correct person using two identifiers.  Location Provider Location : [facility] Patient Location : Home  Participants: Patient , Provider   I discussed the limitations of evaluation and management by telemedicine and the availability of in person appointments. The patient expressed understanding and agreed to proceed.    I discussed the assessment and treatment plan with the patient. The patient was provided an opportunity to ask questions and all were answered. The patient agreed with the plan and demonstrated an understanding of the instructions.   The patient was advised to call back or seek an in-person evaluation if the symptoms worsen or if the condition fails to improve as anticipated.   BH MD OP Progress Note  06/16/2023 10:55 AM LANELLE LINDO  MRN:  [id number]  Chief Complaint:  Chief Complaint  Patient presents with   Follow-up   Depression   Anxiety   Medication Refill   HPI: Terri [name] is a 27 year old Caucasian female lives with her fianc in [place], has a history of bipolar disorder, social anxiety disorder, tobacco use disorder, cocaine and cannabis abuse in remission, pseudotumor cerebri per report was evaluated by telemedicine today.  Patient today reports she is currently tolerating the nortriptyline well.  She reports she used to sleep only 3 hours prior to starting the nortriptyline.  Currently gets around 6 hours which is an improvement for her.  Denies side effects.  Patient continues to have social anxiety.  She reports she does not like to be around people.  She was unable to find a new therapist.  She reports she however is trying to find a dentist since she believes most of her social anxiety stems from her problems with her teeth.  She wants to get that fixed  first.  Patient is compliant on the Lamictal.  Denies any significant mood swings mania or depression symptoms.  Patient denies any suicidality, homicidality or perceptual disturbances.  Patient currently denies any substance abuse.  Denies any other concerns today.  Visit Diagnosis:    ICD-10-CM   1. Bipolar disorder, in full remission, most recent episode mixed (HCC)  F31.78    Type I    2. Social anxiety disorder  F40.10 nortriptyline (PAMELOR) 10 MG capsule    3. Cocaine use disorder, moderate, in sustained remission (HCC)  F14.21     4. Cannabis use disorder, mild, in sustained remission  F12.11       Past Psychiatric History: I have reviewed past psychiatric history from progress note on 07/07/2022.  Past Medical History:  Past Medical History:  Diagnosis Date   Anxiety    No pertinent past medical history    Pseudotumor cerebri     Past Surgical History:  Procedure Laterality Date   CHOLECYSTECTOMY N/A 11/04/2018   Procedure: LAPAROSCOPIC CHOLECYSTECTOMY;  Surgeon: Henrene Dodge, MD;  Location: [facility] ORS;  Service: General;  Laterality: N/A;   DILATION AND EVACUATION N/A 07/13/2017   Procedure: DILATATION AND EVACUATION;  Surgeon: Ward, Elenora Fender, MD;  Location: [facility] ORS;  Service: Gynecology;  Laterality: N/A;   ERCP N/A 11/14/2018   Procedure: ENDOSCOPIC RETROGRADE CHOLANGIOPANCREATOGRAPHY (ERCP);  Surgeon: Midge Minium, MD;  Location: [facility] ENDOSCOPY;  Service: Endoscopy;  Laterality: N/A;    Family Psychiatric History: I have reviewed family psychiatric history from progress note on 07/07/2022.  Family  History:  Family History  Problem Relation Age of Onset   Drug abuse Mother    Arthritis Mother    Thyroid disease Mother    Depression Mother    Anxiety disorder Mother    Suicidality Mother    ALS Maternal Grandfather    Anxiety disorder Maternal Grandmother    Depression Maternal Grandmother    COPD Maternal Grandmother    ADD / ADHD Son    Breast cancer  Neg Hx    Colon cancer Neg Hx     Social History: Reviewed history from progress note on 07/07/2022. Social History   Socioeconomic History   Marital status: Significant Other    Spouse name: Not on file   Number of children: 3   Years of education: Not on file   Highest education level: 12th grade  Occupational History   Occupation: SAHM  Tobacco Use   Smoking status: Former    Current packs/day: 0.50    Average packs/day: 0.5 packs/day for 8.0 years (4.0 ttl pk-yrs)    Types: Cigarettes   Smokeless tobacco: Never  Vaping Use   Vaping status: Never Used  Substance and Sexual Activity   Alcohol use: Yes    Comment: social   Drug use: Yes    Types: Marijuana, Cocaine   Sexual activity: Yes    Partners: Male    Birth control/protection: I.U.D.  Other Topics Concern   Not on file  Social History Narrative   Not on file   Social Determinants of Health   Financial Resource Strain: High Risk (12/07/2022)   Overall Financial Resource Strain (CARDIA)    Difficulty of Paying Living Expenses: Hard  Food Insecurity: Food Insecurity Present (12/07/2022)   Hunger Vital Sign    Worried About Running Out of Food in the Last Year: Often true    Ran Out of Food in the Last Year: Sometimes true  Transportation Needs: Unmet Transportation Needs (12/07/2022)   PRAPARE - Administrator, Civil Service (Medical): Yes    Lack of Transportation (Non-Medical): No  Physical Activity: Insufficiently Active (12/07/2022)   Exercise Vital Sign    Days of Exercise per Week: 2 days    Minutes of Exercise per Session: 30 min  Stress: Stress Concern Present (12/07/2022)   [employer] of Occupational Health - Occupational Stress Questionnaire    Feeling of Stress : To some extent  Social Connections: Moderately Isolated (12/07/2022)   Social Connection and Isolation Panel [NHANES]    Frequency of Communication with Friends and Family: More than three times a week    Frequency of Social  Gatherings with Friends and Family: Once a week    Attends Religious Services: Never    Database administrator or Organizations: No    Attends Engineer, structural: Not on file    Marital Status: Living with partner    Allergies:  Allergies  Allergen Reactions   Sulfa Antibiotics Other (See Comments)    Reaction: unknown.  Told allergic as child but believes she's taken since.   Zithromax [Azithromycin] Other (See Comments)    Reaction: unknown Told reaction as a child    Metabolic Disorder Labs: Lab Results  Component Value Date   HGBA1C 5.3 06/10/2021   No results found for: "PROLACTIN" No results found for: "CHOL", "TRIG", "HDL", "CHOLHDL", "VLDL", "LDLCALC" Lab Results  Component Value Date   TSH 1.795 07/07/2022   TSH 0.995 01/17/2021    Therapeutic Level Labs: No results found  for: "LITHIUM" No results found for: "VALPROATE" No results found for: "CBMZ"  Current Medications: Current Outpatient Medications  Medication Sig Dispense Refill   acetaminophen (TYLENOL) 325 MG tablet Take 650 mg by mouth every 6 (six) hours as needed for moderate pain or headache.     albuterol (VENTOLIN HFA) 108 (90 Base) MCG/ACT inhaler Inhale 1-2 puffs into the lungs every 6 (six) hours as needed for wheezing or shortness of breath. 54 g 0   budesonide-formoterol (SYMBICORT) 160-4.5 MCG/ACT inhaler Inhale 2 puffs into the lungs 2 (two) times daily. 1 each 1   ibuprofen (ADVIL) 600 MG tablet Take 1 tablet (600 mg total) by mouth every 6 (six) hours as needed for moderate pain. 30 tablet 0   lamoTRIgine (LAMICTAL) 25 MG tablet Take 3 tablets (75 mg total) by mouth daily. 90 tablet 1   levonorgestrel (KYLEENA) 19.5 MG IUD 1 each by Intrauterine route once.     predniSONE (DELTASONE) 50 MG tablet Take 1 tablet (50 mg total) by mouth daily with breakfast. 4 tablet 0   nortriptyline (PAMELOR) 10 MG capsule Take 2 capsules (20 mg total) by mouth at bedtime. 60 capsule 1   No current  facility-administered medications for this visit.     Musculoskeletal: Strength & Muscle Tone:  UTA Gait & Station:  Seated Patient leans: N/A  Psychiatric Specialty Exam: Review of Systems  Psychiatric/Behavioral:  Positive for sleep disturbance. The patient is nervous/anxious.     not currently breastfeeding.There is no height or weight on file to calculate BMI.  General Appearance: Fairly Groomed  Eye Contact:  Fair  Speech:  Clear and Coherent  Volume:  Normal  Mood:  Anxious  Affect:  Congruent  Thought Process:  Goal Directed and Descriptions of Associations: Intact  Orientation:  Full (Time, Place, and Person)  Thought Content: Logical   Suicidal Thoughts:  No  Homicidal Thoughts:  No  Memory:  Immediate;   Fair Recent;   Fair Remote;   Fair  Judgement:  Fair  Insight:  Fair  Psychomotor Activity:  Normal  Concentration:  Concentration: Fair and Attention Span: Fair  Recall:  [employer] of Knowledge: Fair  Language: Fair  Akathisia:  No  Handed:  Right  AIMS (if indicated): not done  Assets:  Communication Skills Desire for Improvement Housing Intimacy Social Support Talents/Skills Transportation  ADL's:  Intact  Cognition: WNL  Sleep:   improving   Screenings: GAD-7    Loss adjuster, chartered Office Visit from 06/03/2023 in [facility] Office Visit from 05/05/2023 in [facility] Office Visit from 10/21/2022 in [facility] Office Visit from 09/02/2022 in [facility] Office Visit from 07/07/2022 in [facility]  Total GAD-7 Score 10 12 10 6 11       PHQ2-9    Flowsheet Row Office Visit from 06/15/2023 in [facility] Office Visit from 06/03/2023 in [facility] Office Visit from 05/05/2023 in [facility] Office Visit from 10/21/2022 in [facility] Counselor from 09/23/2022 in [facility]  PHQ-2 Total Score 3 2 1 1 1   PHQ-9 Total Score 11 12 -- 8 6      Flowsheet Row Video Visit from 06/16/2023 in [facility] ED from 05/28/2023 in [facility] at [facility] ED  from 05/26/2023 in [facility] at [facility] RISK CATEGORY No Risk No Risk No Risk        Assessment and Plan: Terri [name] is a 27 year old Caucasian female, lives with her fianc, has a history of bipolar disorder, social anxiety was evaluated by telemedicine today.  Patient with good response to nortriptyline, continues to have anxiety and some sleep issues, will benefit from dosage increase to target the symptoms, will benefit from the following plan.  Plan Bipolar disorder type I mixed mild in remission Lamotrigine 75 mg p.o. daily  Social anxiety disorder-unstable Patient has not been able to establish care with therapist yet encouraged to do so. Increase nortriptyline to 20 mg at bedtime EKG done 05/05/2023-normal sinus rhythm, QTc 418.  Cocaine and cannabis use disorder in remission-currently sober.  Collaboration of Care: Collaboration of Care: Referral or follow-up with counselor/therapist AEB patient encouraged to establish care with therapist.  Patient/Guardian was advised Release of Information must be obtained prior to any record release in order to collaborate their care with an outside provider. Patient/Guardian was advised if they have not already done so to contact the registration department to sign all necessary forms in order for [place] to release information regarding their care.   Consent: Patient/Guardian gives verbal consent for treatment and assignment of benefits for services provided during this visit. Patient/Guardian expressed  understanding and agreed to proceed.   Follow-up in clinic in 2 months or sooner if needed.  This note was generated in part or whole with voice recognition software. Voice recognition is usually quite accurate but there are transcription errors that can and very often do occur. I apologize for any typographical errors that were not detected and corrected.    Jomarie Longs, MD 06/16/2023, 10:55 AM

## 2023-06-30 ENCOUNTER — Other Ambulatory Visit: Payer: Self-pay | Admitting: Psychiatry

## 2023-06-30 DIAGNOSIS — F4010 Social phobia, unspecified: Secondary | ICD-10-CM

## 2023-08-02 ENCOUNTER — Encounter: Payer: Self-pay | Admitting: Psychiatry

## 2023-08-02 ENCOUNTER — Telehealth (INDEPENDENT_AMBULATORY_CARE_PROVIDER_SITE_OTHER): Payer: Medicaid Other | Admitting: Psychiatry

## 2023-08-02 DIAGNOSIS — F430 Acute stress reaction: Secondary | ICD-10-CM | POA: Diagnosis not present

## 2023-08-02 DIAGNOSIS — F1421 Cocaine dependence, in remission: Secondary | ICD-10-CM

## 2023-08-02 DIAGNOSIS — F1211 Cannabis abuse, in remission: Secondary | ICD-10-CM

## 2023-08-02 DIAGNOSIS — F4010 Social phobia, unspecified: Secondary | ICD-10-CM | POA: Diagnosis not present

## 2023-08-02 DIAGNOSIS — F3178 Bipolar disorder, in full remission, most recent episode mixed: Secondary | ICD-10-CM

## 2023-08-02 MED ORDER — LAMOTRIGINE 25 MG PO TABS
75.0000 mg | ORAL_TABLET | Freq: Every day | ORAL | 1 refills | Status: DC
Start: 2023-08-02 — End: 2023-10-08

## 2023-08-02 MED ORDER — PROPRANOLOL HCL 10 MG PO TABS
10.0000 mg | ORAL_TABLET | Freq: Two times a day (BID) | ORAL | 1 refills | Status: DC | PRN
Start: 2023-08-02 — End: 2023-08-13

## 2023-08-02 MED ORDER — NORTRIPTYLINE HCL 10 MG PO CAPS
40.0000 mg | ORAL_CAPSULE | Freq: Every day | ORAL | 1 refills | Status: DC
Start: 2023-08-02 — End: 2024-03-07
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
## 2023-08-02 NOTE — Patient Instructions (Signed)
Propranolol Extended-Release Capsules What is this medication? PROPRANOLOL (proe PRAN oh lole) treats many conditions such as high blood pressure and heart disease. It may also be used to prevent chest pain (angina). It works by lowering your blood pressure and heart rate, making it easier for your heart to pump blood to the rest of your body. It can also be used to prevent migraine headaches. It works by relaxing the blood vessels in the brain that cause migraines. It belongs to a group of medications called beta blockers. This medicine may be used for other purposes; ask your health care provider or pharmacist if you have questions. COMMON BRAND NAME(S): Inderal LA, Inderal XL, InnoPran XL What should I tell my care team before I take this medication? They need to know if you have any of these conditions: Diabetes Having surgery Heart or blood vessel conditions, such as slow heartbeat, heart failure, heart block Kidney disease Liver disease Lung or breathing disease, such as asthma or COPD Myasthenia gravis Pheochromocytoma Thyroid disease An unusual or allergic reaction to propranolol, other medications, foods, dyes, or preservatives Pregnant or trying to get pregnant Breastfeeding How should I use this medication? Take this medication by mouth with water. Take it as directed on the prescription label at the same time every day. Do not cut, crush, or chew this medication. Swallow the capsules whole. You can take it with or without food. If it upsets your stomach, take it with food. Keep taking it unless your care team tells you to stop. Talk to your care team about the use of this medication in children. Special care may be needed. Overdosage: If you think you have taken too much of this medicine contact a poison control center or emergency room at once. NOTE: This medicine is only for you. Do not share this medicine with others. What if I miss a dose? If you miss a dose, take it as soon  as you can. If it is almost time for your next dose, take only that dose. Do not take double or extra doses. What may interact with this medication? Do not take this medication with any of the following: Thioridazine This medication may also interact with the following: Certain medications for blood pressure, heart disease, irregular heartbeat Epinephrine NSAIDs, medications for pain and inflammation, such as ibuprofen or naproxen Warfarin Other medications may affect the way this medication works. Talk with your care team about all of the medications you take. They may suggest changes to your treatment plan to lower the risk of side effects and to make sure your medications work as intended. This list may not describe all possible interactions. Give your health care provider a list of all the medicines, herbs, non-prescription drugs, or dietary supplements you use. Also tell them if you smoke, drink alcohol, or use illegal drugs. Some items may interact with your medicine. What should I watch for while using this medication? Visit your care team for regular checks on your progress. Check your blood pressure as directed. Know what your blood pressure should be and when to contact your care team. This medication may affect your coordination, reaction time, or judgment. Do not drive or operate machinery until you know how this medication affects you. Sit up or stand slowly to reduce the risk of dizzy or fainting spells. Drinking alcohol with this medication can increase the risk of these side effects. Do not suddenly stop taking this medication. This may increase your risk of side effects, such as  chest pain and heart attack. If you no longer need to take this medication, your care team will lower the dose slowly over time to decrease the risk of side effects. If you are going to need surgery or a procedure, tell your care team that you are using this medication. This medication may affect blood  glucose levels. It can also mask the symptoms of low blood sugar, such as a rapid heartbeat and tremors. If you have diabetes, it is important to check your blood sugar often while you are taking this medication. Do not treat yourself for coughs, colds, or pain while you are using this medication without asking your care team for advice. Some medications may increase your blood pressure. What side effects may I notice from receiving this medication? Side effects that you should report to your care team as soon as possible: Allergic reactions--skin rash, itching, hives, swelling of the face, lips, tongue, or throat Heart failure--shortness of breath, swelling of the ankles, feet, or hands, sudden weight gain, unusual weakness or fatigue Low blood pressure--dizziness, feeling faint or lightheaded, blurry vision Raynaud's--cool, numb, or painful fingers or toes that may change color from pale, to blue, to red Redness, blistering, peeling, or loosening of the skin, including inside the mouth Slow heartbeat--dizziness, feeling faint or lightheaded, confusion, trouble breathing, unusual weakness or fatigue Worsening mood, feelings of depression Side effects that usually do not require medical attention (report to your care team if they continue or are bothersome): Change in sex drive or performance Diarrhea Dizziness Fatigue Headache This list may not describe all possible side effects. Call your doctor for medical advice about side effects. You may report side effects to FDA at 1-800-FDA-1088. Where should I keep my medication? Keep out of the reach of children and pets. Store at room temperature between 15 and 30 degrees C (59 and 86 degrees F). Protect from light and moisture. Keep the container tightly closed. Avoid exposure to extreme heat. Do not freeze. Throw away any unused medication after the expiration date. NOTE: This sheet is a summary. It may not cover all possible information. If you  have questions about this medicine, talk to your doctor, pharmacist, or health care provider.  2024 Elsevier/Gold Standard (2022-08-17 00:00:00)

## 2023-08-04 ENCOUNTER — Telehealth (HOSPITAL_COMMUNITY): Payer: Self-pay | Admitting: Professional

## 2023-08-13 ENCOUNTER — Encounter: Payer: Self-pay | Admitting: Psychiatry

## 2023-08-13 ENCOUNTER — Telehealth (INDEPENDENT_AMBULATORY_CARE_PROVIDER_SITE_OTHER): Payer: Medicaid Other | Admitting: Psychiatry

## 2023-08-13 DIAGNOSIS — F4010 Social phobia, unspecified: Secondary | ICD-10-CM | POA: Diagnosis not present

## 2023-08-13 DIAGNOSIS — F3178 Bipolar disorder, in full remission, most recent episode mixed: Secondary | ICD-10-CM | POA: Insufficient documentation

## 2023-08-13 DIAGNOSIS — F1211 Cannabis abuse, in remission: Secondary | ICD-10-CM | POA: Diagnosis not present

## 2023-08-13 DIAGNOSIS — F4310 Post-traumatic stress disorder, unspecified: Secondary | ICD-10-CM

## 2023-08-13 DIAGNOSIS — F1421 Cocaine dependence, in remission: Secondary | ICD-10-CM

## 2023-08-13 MED ORDER — PRAZOSIN HCL 1 MG PO CAPS: 1.0000 mg | ORAL_CAPSULE | Freq: Every day | ORAL | 1 refills | Status: DC

## 2023-08-13 MED ORDER — PROPRANOLOL HCL 10 MG PO TABS: 20.0000 mg | ORAL_TABLET | Freq: Two times a day (BID) | ORAL | 1 refills | Status: DC | PRN
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
## 2023-08-16 ENCOUNTER — Ambulatory Visit: Payer: Medicaid Other | Admitting: Family Medicine

## 2023-08-18 ENCOUNTER — Other Ambulatory Visit: Payer: Self-pay | Admitting: Family Medicine

## 2023-08-18 ENCOUNTER — Telehealth: Payer: Medicaid Other | Admitting: Physician Assistant

## 2023-08-18 DIAGNOSIS — J4541 Moderate persistent asthma with (acute) exacerbation: Secondary | ICD-10-CM

## 2023-08-18 NOTE — Telephone Encounter (Signed)
Medication no longer listed on current medication list Requested Prescriptions  Pending Prescriptions Disp Refills   SYMBICORT 160-4.5 MCG/ACT inhaler [Pharmacy Med Name: SYMBICORT 160-4.5 MCG INHALER] 10.2 each 1    Sig: INHALE 2 PUFFS INTO THE LUNGS TWICE A DAY     Pulmonology:  Comb[employer] Products Passed - 08/18/2023 10:17 AM      Passed - Valid encounter within last 12 months    Recent Outpatient Visits           2 months ago Moderate persistent asthma with acute exacerbation   [facility] [place], Monico Blitz, DO   2 months ago Subacute cough   [facility] Mel[place], Marzella Schlein, MD   8 months ago Neck pain   [facility] Alfredia Ferguson, PA-C   1 year ago Depression, major, single episode, severe [facility])   [facility] Alfredia Ferguson, PA-C   1 year ago Depression, major, single episode, severe [facility])   [facility] Alfredia Ferguson, PA-C       Future Appointments             In 2 weeks Bacigalupo, Marzella Schlein, MD [facility]

## 2023-08-20 ENCOUNTER — Telehealth (INDEPENDENT_AMBULATORY_CARE_PROVIDER_SITE_OTHER): Payer: Medicaid Other | Admitting: Family Medicine

## 2023-08-20 ENCOUNTER — Encounter: Payer: Self-pay | Admitting: Family Medicine

## 2023-08-20 DIAGNOSIS — R053 Chronic cough: Secondary | ICD-10-CM | POA: Diagnosis not present

## 2023-08-20 DIAGNOSIS — R0602 Shortness of breath: Secondary | ICD-10-CM | POA: Diagnosis not present

## 2023-08-20 MED ORDER — BUDESONIDE-FORMOTEROL FUMARATE 160-4.5 MCG/ACT IN AERO: 2.0000 | INHALATION_SPRAY | Freq: Two times a day (BID) | RESPIRATORY_TRACT | 3 refills | Status: AC

## 2023-08-20 MED ORDER — ALBUTEROL SULFATE HFA 108 (90 BASE) MCG/ACT IN AERS: 2.0000 | INHALATION_SPRAY | Freq: Four times a day (QID) | RESPIRATORY_TRACT | 2 refills | Status: AC | PRN

## 2023-08-20 NOTE — Progress Notes (Signed)
MyChart Video Visit    Virtual Visit via Video Note   This format is felt to be most appropriate for this patient at this time. Physical exam was limited by quality of the video and audio technology used for the visit.    Patient location: home Provider location: [facility] Persons involved in the visit: patient, provider  I discussed the limitations of evaluation and management by telemedicine and the availability of in person appointments. The patient expressed understanding and agreed to proceed.  Patient: Terri [name]   DOB: 1996-01-29   27 y.o. Female  MRN: [id number] Visit Date: 08/20/2023  Today's healthcare provider: Shirlee Latch, MD   No chief complaint on file.  Subjective    HPI   Discussed the use of AI scribe software for clinical note transcription with the patient, who gave verbal consent to proceed.  History of Present Illness   The patient, with a history of anxiety, presents with a chronic cough and difficulty breathing that began in September. They initially sought care at an urgent care and emergency room, where they were treated with steroids, antibiotics, and an inhaler for a presumed sinus infection. However, the only treatment that has provided relief is a steroid inhaler. The patient recently attempted to discontinue the inhaler, as it was initially prescribed for short-term use, but their symptoms promptly returned. They describe their symptoms as coughing and an inability to breathe, rather than shortness of breath. The patient has not been previously diagnosed with asthma, but the persistence and nature of their symptoms suggest a possible asthma diagnosis.        Review of Systems      Objective    There were no vitals taken for this visit.      Physical Exam Constitutional:      General: She is not in acute distress.    Appearance: Normal appearance.  HENT:     Head: Normocephalic.  Pulmonary:      Effort: Pulmonary effort is normal. No respiratory distress.  Neurological:     Mental Status: She is alert and oriented to person, place, and time. Mental status is at baseline.        Assessment & Plan     Problem List Items Addressed This Visit   None Visit Diagnoses       Chronic cough    -  Primary   Relevant Orders   Pulmonary function test     Shortness of breath       Relevant Orders   Pulmonary function test           Chronic Cough with Possible Asthma Presents with chronic cough and dyspnea since September. Initial treatment with steroids, antibiotics, and a regular inhaler provided temporary relief. Symbicort has been effective. No formal asthma diagnosis but similar respiratory issues responded to albuterol. Pulmonary function tests needed to confirm asthma. Currently on propranolol for anxiety, which may need adjustment if asthma is confirmed. Discussed continuing Symbicort and using albuterol as needed. Explained that inhaler use will not affect pulmonary function test accuracy. - Order pulmonary function tests at [facility] - Prescribe Symbicort 2 puffs BID - Ensure albuterol inhaler for PRN use - Instruct to continue Symbicort and bring albuterol inhaler to the pulmonary function test - Schedule follow-up for January 6th to review test results and reassess treatment  General Health Maintenance Physical exam scheduled for January 6th, which will also serve as follow-up for chronic cough and inhaler use. -  Conduct physical exam on January 6th - Review and reassess chronic cough and inhaler use during the physical exam  Follow-up - Inform Dr. Mariam Dollar about asthma workup during January 26th appointment - Discuss potential changes to anxiety medication based on asthma diagnosis.         Meds ordered this encounter  Medications   budesonide-formoterol (SYMBICORT) 160-4.5 MCG/ACT inhaler    Sig: Inhale 2 puffs into the lungs 2 (two) times daily.    Dispense:  1  each    Refill:  3   albuterol (VENTOLIN HFA) 108 (90 Base) MCG/ACT inhaler    Sig: Inhale 2 puffs into the lungs every 6 (six) hours as needed for wheezing or shortness of breath.    Dispense:  8 g    Refill:  2     Return for as scheduled.     I discussed the assessment and treatment plan with the patient. The patient was provided an opportunity to ask questions and all were answered. The patient agreed with the plan and demonstrated an understanding of the instructions.   The patient was advised to call back or seek an in-person evaluation if the symptoms worsen or if the condition fails to improve as anticipated.   Shirlee Latch, MD [facility] [phone] (phone) [phone] (fax)  [facility]

## 2023-08-26 ENCOUNTER — Ambulatory Visit (INDEPENDENT_AMBULATORY_CARE_PROVIDER_SITE_OTHER): Payer: Medicaid Other | Admitting: Licensed Clinical Social Worker

## 2023-08-26 DIAGNOSIS — Z91199 Patient's noncompliance with other medical treatment and regimen due to unspecified reason: Secondary | ICD-10-CM

## 2023-08-26 NOTE — Progress Notes (Signed)
Clinician attempted session via face-to-face, but Therisa Doyne Minchew did not appear for her session.

## 2023-09-06 ENCOUNTER — Ambulatory Visit: Payer: Medicaid Other | Admitting: Family Medicine

## 2023-09-06 ENCOUNTER — Encounter: Payer: Self-pay | Admitting: Family Medicine

## 2023-09-06 VITALS — BP 126/82 | HR 87 | Temp 98.5°F | Resp 20 | Ht 63.6 in | Wt 238.0 lb

## 2023-09-06 DIAGNOSIS — Z6841 Body Mass Index (BMI) 40.0 and over, adult: Secondary | ICD-10-CM

## 2023-09-06 DIAGNOSIS — Z0001 Encounter for general adult medical examination with abnormal findings: Secondary | ICD-10-CM

## 2023-09-06 DIAGNOSIS — Z0000 Encounter for general adult medical examination without abnormal findings: Secondary | ICD-10-CM

## 2023-09-06 DIAGNOSIS — Z862 Personal history of diseases of the blood and blood-forming organs and certain disorders involving the immune mechanism: Secondary | ICD-10-CM

## 2023-09-06 DIAGNOSIS — R519 Headache, unspecified: Secondary | ICD-10-CM

## 2023-09-06 DIAGNOSIS — J069 Acute upper respiratory infection, unspecified: Secondary | ICD-10-CM

## 2023-09-06 LAB — POC COVID19/FLU A&B COMBO
Covid Antigen, POC: NEGATIVE
Influenza A Antigen, POC: NEGATIVE
Influenza B Antigen, POC: NEGATIVE

## 2023-09-06 MED ORDER — KETOROLAC TROMETHAMINE 60 MG/2ML IM SOLN
60.0000 mg | Freq: Once | INTRAMUSCULAR | Status: AC
Start: 2023-09-06 — End: 2023-09-06
  Administered 2023-09-06: 60 mg via INTRAMUSCULAR

## 2023-09-06 NOTE — Progress Notes (Signed)
 Complete physical exam   Patient: Terri [name]   DOB: June 01, 1996   27 y.o. Female  MRN: [id number] Visit Date: 09/06/2023  Today's healthcare provider: Jon Eva, MD   Chief Complaint  Patient presents with   Annual Exam   Cough        Nasal Congestion        Generalized Body Aches        Subjective    Terri [name] is a 28 y.o. female who presents today for a complete physical exam.  She reports consuming a general diet.  She generally feels fairly well. She reports sleeping fairly well. She does have additional problems to discuss today.    Discussed the use of AI scribe software for clinical note transcription with the patient, who gave verbal consent to proceed.  History of Present Illness   The patient, with a past medical history of asthma, presents with a four-day history of symptoms suggestive of a viral upper respiratory infection. The patient reports experiencing body aches, severe congestion, headaches, and pressure in the eyes. The patient also mentions occasional dry cough but denies any significant respiratory distress. The patient has been using an inhaler (Symbicort ) regularly for a previous episode of coughing and wheezing that started in September, which has largely resolved. The patient has been taking over-the-counter medications, including Sudafed and Benadryl , for symptom relief but reports limited effectiveness. The patient works in a visual merchandiser and reports that several coworkers have similar symptoms, with one testing positive for COVID-19.       Last depression screening scores    09/06/2023    2:26 PM 06/15/2023    8:58 AM 06/03/2023    9:15 AM  PHQ 2/9 Scores  PHQ - 2 Score 0 3 2  PHQ- 9 Score 2 11 12    Last fall risk screening    09/06/2023    2:27 PM  Fall Risk   Falls in the past year? 0  Number falls in past yr: 0  Injury with Fall? 0        Medications: Outpatient Medications Prior to Visit   Medication Sig   acetaminophen  (TYLENOL ) 325 MG tablet Take 650 mg by mouth every 6 (six) hours as needed for moderate pain or headache.   albuterol  (VENTOLIN  HFA) 108 (90 Base) MCG/ACT inhaler Inhale 2 puffs into the lungs every 6 (six) hours as needed for wheezing or shortness of breath.   budesonide -formoterol  (SYMBICORT ) 160-4.5 MCG/ACT inhaler Inhale 2 puffs into the lungs 2 (two) times daily.   ibuprofen  (ADVIL ) 600 MG tablet Take 1 tablet (600 mg total) by mouth every 6 (six) hours as needed for moderate pain.   lamoTRIgine  (LAMICTAL ) 25 MG tablet Take 3 tablets (75 mg total) by mouth daily.   levonorgestrel  (KYLEENA ) 19.5 MG IUD 1 each by Intrauterine route once.   nortriptyline  (PAMELOR ) 10 MG capsule Take 4 capsules (40 mg total) by mouth at bedtime.   prazosin  (MINIPRESS ) 1 MG capsule Take 1 capsule (1 mg total) by mouth at bedtime.   propranolol  (INDERAL ) 10 MG tablet Take 2 tablets (20 mg total) by mouth 2 (two) times daily as needed. For anxiety attacks   No facility-administered medications prior to visit.    Review of Systems    Objective    BP 126/82   Pulse 87   Temp 98.5 F (36.9 C)   Resp 20   Ht 5' 3.6 (1.615 m)   Wt 238 lb (108  kg)   SpO2 98%   BMI 41.37 kg/m    Physical Exam Vitals reviewed.  Constitutional:      General: She is not in acute distress.    Appearance: Normal appearance. She is well-developed. She is not diaphoretic.  HENT:     Head: Normocephalic and atraumatic.     Right Ear: Tympanic membrane, ear canal and external ear normal.     Left Ear: Tympanic membrane, ear canal and external ear normal.     Nose: Congestion present.     Mouth/Throat:     Mouth: Mucous membranes are moist.     Pharynx: Oropharynx is clear. Posterior oropharyngeal erythema present. No oropharyngeal exudate.  Eyes:     General: No scleral icterus.    Conjunctiva/sclera: Conjunctivae normal.     Pupils: Pupils are equal, round, and reactive to light.   Neck:     Thyroid : No thyromegaly.  Cardiovascular:     Rate and Rhythm: Normal rate and regular rhythm.     Heart sounds: Normal heart sounds. No murmur heard. Pulmonary:     Effort: Pulmonary effort is normal. No respiratory distress.     Breath sounds: Normal breath sounds. No wheezing or rales.  Abdominal:     General: There is no distension.     Palpations: Abdomen is soft.     Tenderness: There is no abdominal tenderness.  Musculoskeletal:        General: No deformity.     Cervical back: Neck supple.     Right lower leg: No edema.     Left lower leg: No edema.  Lymphadenopathy:     Cervical: No cervical adenopathy.  Skin:    General: Skin is warm and dry.     Findings: No rash.  Neurological:     Mental Status: She is alert and oriented to person, place, and time. Mental status is at baseline.     Gait: Gait normal.  Psychiatric:        Mood and Affect: Mood normal.        Behavior: Behavior normal.        Thought Content: Thought content normal.      Results for orders placed or performed in visit on 09/06/23  POC Covid19/Flu A&B Antigen  Result Value Ref Range   Influenza A Antigen, POC Negative Negative   Influenza B Antigen, POC Negative Negative   Covid Antigen, POC Negative Negative    Assessment & Plan    Routine Health Maintenance and Physical Exam  Exercise Activities and Dietary recommendations  Goals   None     Immunization History  Administered Date(s) Administered   Tdap 06/10/2021    Health Maintenance  Topic Date Due   COVID-19 Vaccine (1) Never done   Pneumococcal Vaccine 75-59 Years old (1 of 2 - PCV) Never done   Cervical Cancer Screening (Pap smear)  Never done   INFLUENZA VACCINE  11/29/2023 (Originally 04/01/2023)   DTaP/Tdap/Td (2 - Td or Tdap) 06/11/2031   Hepatitis C Screening  Completed   HIV Screening  Completed   HPV VACCINES  Aged Out    Discussed health benefits of physical activity, and encouraged her to engage in  regular exercise appropriate for her age and condition.  Problem List Items Addressed This Visit   None Visit Diagnoses       Encounter for annual physical exam    -  Primary   Relevant Orders   Comprehensive metabolic panel   Lipid  panel   CBC w/Diff/Platelet   Iron, TIBC and Ferritin Panel     Viral URI       Relevant Orders   POC Covid19/Flu A&B Antigen (Completed)     History of anemia       Relevant Orders   CBC w/Diff/Platelet   Iron, TIBC and Ferritin Panel     Morbid obesity (HCC)       Relevant Orders   Comprehensive metabolic panel   Lipid panel     Acute nonintractable headache, unspecified headache type               Viral Upper Respiratory Infection Symptoms include congestion, headache, body aches, and watery eyes, progressively worsening over four days. Negative for COVID-19 and influenza. Likely viral etiology given the season and similar symptoms among coworkers. No signs of pneumonia or RSV. Symbicort  and albuterol  inhalers effectively manage asthma, no sign of exacerbation. Discussed that antibiotics are ineffective for viral infections and emphasized symptomatic treatment. Informed about Toradol  for headache relief, safe for driving. - Administer Toradol  injection (60mg ) for headache relief - Recommend continued use of decongestants and nasal sprays (Flonase  or Nasonex) - Advise use of Benadryl  for drying up secretions - Recommend Tylenol  for body aches and headaches - Advise wearing a mask at work to prevent spreading the illness - Schedule routine labs including kidney and liver function, cholesterol, blood sugar, and blood counts  General Health Maintenance Due for Pap smear and pneumonia vaccine. No flu shot received this year. Up to date on tetanus shot until 2032. Behavioral health management and IUD in place. Discussed the importance of the pneumonia vaccine for those with asthma and the timing of the next IUD replacement. - Schedule Pap smear and  pneumonia vaccine - Discuss the importance of flu vaccination - Continue behavioral health management - Monitor IUD status and plan for replacement in October 2027 - Schedule next year's physical  Follow-up - Schedule follow-up visit for Pap smear and pneumonia vaccine - Schedule routine labs today - Schedule next year's physical.        Return in about 4 weeks (around 10/04/2023) for pap smear, pcv vaccine and 1 yr CPE.     Jon Eva, MD  [facility] [phone] (phone) [phone] (fax)  [facility]

## 2023-09-06 NOTE — Addendum Note (Signed)
 Addended by: Janey Greaser D on: 09/06/2023 03:09 PM   Modules accepted: Orders

## 2023-09-07 LAB — COMPREHENSIVE METABOLIC PANEL
ALT: 16 [IU]/L (ref 0–32)
AST: 21 [IU]/L (ref 0–40)
Albumin: 4.3 g/dL (ref 4.0–5.0)
Alkaline Phosphatase: 84 [IU]/L (ref 44–121)
BUN/Creatinine Ratio: 11 (ref 9–23)
BUN: 6 mg/dL (ref 6–20)
Bilirubin Total: 0.4 mg/dL (ref 0.0–1.2)
CO2: 23 mmol/L (ref 20–29)
Calcium: 9.4 mg/dL (ref 8.7–10.2)
Chloride: 103 mmol/L (ref 96–106)
Creatinine, Ser: 0.57 mg/dL (ref 0.57–1.00)
Globulin, Total: 2.3 g/dL (ref 1.5–4.5)
Glucose: 82 mg/dL (ref 70–99)
Potassium: 4.4 mmol/L (ref 3.5–5.2)
Sodium: 140 mmol/L (ref 134–144)
Total Protein: 6.6 g/dL (ref 6.0–8.5)
eGFR: 126 mL/min/{1.73_m2} (ref >59)

## 2023-09-07 LAB — LIPID PANEL
Chol/HDL Ratio: 4.4 {ratio} (ref 0.0–4.4)
Cholesterol, Total: 179 mg/dL (ref 100–199)
HDL: 41 mg/dL (ref >39)
LDL Chol Calc (NIH): 99 mg/dL (ref 0–99)
Triglycerides: 230 mg/dL — ABNORMAL HIGH (ref 0–149)
VLDL Cholesterol Cal: 39 mg/dL (ref 5–40)

## 2023-09-07 LAB — CBC WITH DIFFERENTIAL/PLATELET
Basophils Absolute: 0 10*3/uL (ref 0.0–0.2)
Basos: 0 %
EOS (ABSOLUTE): 0.5 10*3/uL — ABNORMAL HIGH (ref 0.0–0.4)
Eos: 4 %
Hematocrit: 41.2 % (ref 34.0–46.6)
Hemoglobin: 14 g/dL (ref 11.1–15.9)
Immature Grans (Abs): 0 10*3/uL (ref 0.0–0.1)
Immature Granulocytes: 0 %
Lymphocytes Absolute: 2.2 10*3/uL (ref 0.7–3.1)
Lymphs: 18 %
MCH: 28.2 pg (ref 26.6–33.0)
MCHC: 34 g/dL (ref 31.5–35.7)
MCV: 83 fL (ref 79–97)
Monocytes Absolute: 0.6 10*3/uL (ref 0.1–0.9)
Monocytes: 5 %
Neutrophils Absolute: 9 10*3/uL — ABNORMAL HIGH (ref 1.4–7.0)
Neutrophils: 73 %
Platelets: 228 10*3/uL (ref 150–450)
RBC: 4.96 x10E6/uL (ref 3.77–5.28)
RDW: 12.3 % (ref 11.7–15.4)
WBC: 12.3 10*3/uL — ABNORMAL HIGH (ref 3.4–10.8)

## 2023-09-07 LAB — IRON,TIBC AND FERRITIN PANEL
Ferritin: 79 ng/mL (ref 15–150)
Iron Saturation: 41 % (ref 15–55)
Iron: 110 ug/dL (ref 27–159)
Total Iron Binding Capacity: 271 ug/dL (ref 250–450)
UIBC: 161 ug/dL (ref 131–425)

## 2023-09-27 ENCOUNTER — Ambulatory Visit: Payer: Medicaid Other | Admitting: Psychiatry

## 2023-10-04 ENCOUNTER — Ambulatory Visit: Payer: Self-pay | Admitting: Family Medicine

## 2023-10-07 ENCOUNTER — Other Ambulatory Visit: Payer: Self-pay | Admitting: Psychiatry

## 2023-10-07 DIAGNOSIS — F4010 Social phobia, unspecified: Secondary | ICD-10-CM

## 2023-11-10 ENCOUNTER — Telehealth: Payer: Self-pay | Admitting: Family Medicine

## 2023-11-10 NOTE — Telephone Encounter (Signed)
 Copied from CRM [id number]. Topic: Appointments - Scheduling Inquiry for Clinic >> Nov 10, 2023  4:20 PM Geroge Baseman wrote: Reason for CRM: Patient is wanting to see if Dr. B would be willing to take on her 28 year old daughter as a patient. Please call to advise.

## 2023-11-11 NOTE — Telephone Encounter (Signed)
New patient approved. Needs 40 min new patient slot.

## 2023-11-17 ENCOUNTER — Telehealth: Payer: Self-pay | Admitting: Pharmacy Technician

## 2023-11-17 ENCOUNTER — Other Ambulatory Visit (HOSPITAL_COMMUNITY): Payer: Self-pay

## 2023-11-17 NOTE — Telephone Encounter (Signed)
 Pharmacy Patient Advocate Encounter   Received notification from Patient Pharmacy that prior authorization for Budesonide-Formoterol Fumarate 160-4.5MCG/ACT aerosol is required/requested.   Insurance verification completed.   The patient is insured through [facility] .   Per test claim:  BRAND NAME SYMBICORT is preferred by the insurance.  If suggested medication is appropriate, Please send in a new RX and discontinue this one. If not, please advise as to why it's not appropriate so that we may request a Prior Authorization. Please note, some preferred medications may still require a PA.  If the suggested medications have not been trialed and there are no contraindications to their use, the PA will not be submitted, as it will not be approved.  Called patient pharmacy for reprocessing for Brand Name Symbicort.

## 2023-12-09 ENCOUNTER — Telehealth: Admitting: Physician Assistant

## 2023-12-09 DIAGNOSIS — R3989 Other symptoms and signs involving the genitourinary system: Secondary | ICD-10-CM

## 2023-12-09 MED ORDER — CEPHALEXIN 500 MG PO CAPS: 500.0000 mg | ORAL_CAPSULE | Freq: Two times a day (BID) | ORAL | 0 refills | Status: AC

## 2023-12-09 NOTE — Progress Notes (Signed)
 I have spent 5 minutes in review of e-visit questionnaire, review and updating patient chart, medical decision making and response to patient.   [name], PA-C

## 2023-12-09 NOTE — Progress Notes (Signed)

## 2023-12-10 ENCOUNTER — Telehealth: Payer: Self-pay | Admitting: Psychiatry

## 2023-12-10 ENCOUNTER — Other Ambulatory Visit: Payer: Self-pay | Admitting: Psychiatry

## 2023-12-10 DIAGNOSIS — F4310 Post-traumatic stress disorder, unspecified: Secondary | ICD-10-CM

## 2023-12-10 NOTE — Telephone Encounter (Signed)
 Reached out to patient to reschedule her missed appointment with provider. Message left to call if she wishes to continue care and receive medication refills at our office.

## 2023-12-14 ENCOUNTER — Telehealth: Payer: Self-pay

## 2023-12-14 ENCOUNTER — Other Ambulatory Visit: Payer: Self-pay | Admitting: Psychiatry

## 2023-12-14 DIAGNOSIS — F4310 Post-traumatic stress disorder, unspecified: Secondary | ICD-10-CM

## 2023-12-14 DIAGNOSIS — F4010 Social phobia, unspecified: Secondary | ICD-10-CM

## 2023-12-14 MED ORDER — PROPRANOLOL HCL 10 MG PO TABS
20.0000 mg | ORAL_TABLET | Freq: Two times a day (BID) | ORAL | 0 refills | Status: DC | PRN
Start: 2023-12-14 — End: 2024-04-12

## 2023-12-14 NOTE — Telephone Encounter (Signed)
 received fax requesting a refill on the propranolol 10mg . pt was last seen on 12-13 next 5-21.

## 2023-12-14 NOTE — Telephone Encounter (Signed)
 Left message that rx had been sent to the pharmacy

## 2023-12-14 NOTE — Telephone Encounter (Signed)
Ordered for a month as a bridge.

## 2024-01-19 ENCOUNTER — Ambulatory Visit: Admitting: Psychiatry

## 2024-01-23 ENCOUNTER — Telehealth

## 2024-01-23 DIAGNOSIS — H6992 Unspecified Eustachian tube disorder, left ear: Secondary | ICD-10-CM | POA: Diagnosis not present

## 2024-01-23 MED ORDER — IPRATROPIUM BROMIDE 0.03 % NA SOLN: 2.0000 | Freq: Two times a day (BID) | NASAL | 0 refills | Status: DC

## 2024-01-23 NOTE — Progress Notes (Signed)

## 2024-01-25 ENCOUNTER — Telehealth: Admitting: Physician Assistant

## 2024-01-25 DIAGNOSIS — J069 Acute upper respiratory infection, unspecified: Secondary | ICD-10-CM

## 2024-01-25 MED ORDER — BENZONATATE 100 MG PO CAPS
100.0000 mg | ORAL_CAPSULE | Freq: Three times a day (TID) | ORAL | 0 refills | Status: DC | PRN
Start: 2024-01-25 — End: 2024-03-01

## 2024-01-25 NOTE — Progress Notes (Signed)

## 2024-01-25 NOTE — Progress Notes (Signed)
 I have spent 5 minutes in review of e-visit questionnaire, review and updating patient chart, medical decision making and response to patient.   [name], PA-C

## 2024-01-25 NOTE — Progress Notes (Signed)
 Message sent to patient requesting further input regarding current symptoms. Awaiting patient response.

## 2024-01-27 ENCOUNTER — Ambulatory Visit: Admitting: Nurse Practitioner

## 2024-01-27 VITALS — BP 108/64 | HR 98 | Temp 97.9°F | Ht 63.0 in | Wt 244.1 lb

## 2024-01-27 DIAGNOSIS — J029 Acute pharyngitis, unspecified: Secondary | ICD-10-CM

## 2024-01-27 DIAGNOSIS — R051 Acute cough: Secondary | ICD-10-CM | POA: Diagnosis not present

## 2024-01-27 DIAGNOSIS — J0140 Acute pansinusitis, unspecified: Secondary | ICD-10-CM

## 2024-01-27 DIAGNOSIS — H66002 Acute suppurative otitis media without spontaneous rupture of ear drum, left ear: Secondary | ICD-10-CM | POA: Diagnosis not present

## 2024-01-27 MED ORDER — AMOXICILLIN-POT CLAVULANATE 875-125 MG PO TABS: 1.0000 | ORAL_TABLET | Freq: Two times a day (BID) | ORAL | 0 refills | Status: DC

## 2024-01-27 MED ORDER — PROMETHAZINE-DM 6.25-15 MG/5ML PO SYRP
5.0000 mL | ORAL_SOLUTION | Freq: Four times a day (QID) | ORAL | 0 refills | Status: DC | PRN
Start: 2024-01-27 — End: 2024-03-01

## 2024-01-27 NOTE — Progress Notes (Addendum)
 BP 108/64 (BP Location: Right Arm, Patient Position: Sitting, Cuff Size: Normal)   Pulse (!) 110   Temp 97.9 F (36.6 C)   Ht 5\' 3"  (1.6 m)   Wt 244 lb 1.6 oz (110.7 kg)   SpO2 96%   BMI 43.24 kg/m    Subjective:    Patient ID: Terri [name], female    DOB: [birth year], 28 y.o.   MRN: [id number]  HPI: Terri [name] is a 28 y.o. female  Chief Complaint  Patient presents with   URI    Onset 1 week, congestion, earache, headache, cough    Discussed the use of AI scribe software for clinical note transcription with the patient, who gave verbal consent to proceed.  History of Present Illness Terri [name] is a 28 year old female who presents with symptoms of an upper respiratory infection for one week.  She has been experiencing congestion, earache, headache, and cough for the past week. The cough occurs both during the day and at night, and is particularly bothersome when lying down, making it difficult for her to sleep.  She feels winded when talking, stating that it takes significant effort to speak. Additionally, she has a sore throat that makes swallowing, eating, and drinking painful. She has experienced fever, primarily at night.  She had a virtual visit on Jan 25, 2024, during which she was prescribed Tessalon  Perles. She has also been taking Benadryl , dayquil and nyquil to manage her symptoms.  During the review of symptoms, she confirms having a sore throat and fever, but denies any other symptoms not already mentioned.         01/27/2024    1:34 PM 09/06/2023    2:26 PM 06/15/2023    8:58 AM  Depression screen PHQ 2/9  Decreased Interest 0 0 2  Down, Depressed, Hopeless 0 0 1  PHQ - 2 Score 0 0 3  Altered sleeping 0 0 3  Tired, decreased energy 0 1 3  Change in appetite 0 0 0  Feeling bad or failure about yourself  0 0 0  Trouble concentrating 0 1 2  Moving slowly or fidgety/restless 0 0 0  Suicidal thoughts 0 0 0  PHQ-9 Score 0 2 11   Difficult doing work/chores Not difficult at all Not difficult at all Somewhat difficult    Relevant past medical, surgical, family and social history reviewed and updated as indicated. Interim medical history since our last visit reviewed. Allergies and medications reviewed and updated.  Review of Systems  Ten systems reviewed and is negative except as mentioned in HPI      Objective:      BP 108/64 (BP Location: Right Arm, Patient Position: Sitting, Cuff Size: Normal)   Pulse (!) 110   Temp 97.9 F (36.6 C)   Ht 5\' 3"  (1.6 m)   Wt 244 lb 1.6 oz (110.7 kg)   SpO2 96%   BMI 43.24 kg/m    Wt Readings from Last 3 Encounters:  01/27/24 244 lb 1.6 oz (110.7 kg)  09/06/23 238 lb (108 kg)  06/15/23 237 lb (107.5 kg)    Physical Exam Vitals reviewed.  Constitutional:      Appearance: Normal appearance.  HENT:     Head: Normocephalic.     Right Ear: Tympanic membrane normal.     Left Ear: Tympanic membrane is injected, erythematous and bulging.     Nose: Congestion present.     Right Sinus: No maxillary sinus tenderness  or frontal sinus tenderness.     Left Sinus: No maxillary sinus tenderness or frontal sinus tenderness.     Mouth/Throat:     Mouth: Mucous membranes are moist.     Pharynx: Oropharyngeal exudate and posterior oropharyngeal erythema present.  Eyes:     Extraocular Movements: Extraocular movements intact.     Conjunctiva/sclera: Conjunctivae normal.     Pupils: Pupils are equal, round, and reactive to light.  Cardiovascular:     Rate and Rhythm: Normal rate and regular rhythm.  Pulmonary:     Effort: Pulmonary effort is normal.     Breath sounds: Normal breath sounds.  Musculoskeletal:     Cervical back: Neck supple.  Lymphadenopathy:     Cervical: Cervical adenopathy present.  Skin:    General: Skin is warm and dry.  Neurological:     General: No focal deficit present.     Mental Status: She is alert and oriented to person, place, and time. Mental  status is at baseline.  Psychiatric:        Mood and Affect: Mood normal.        Behavior: Behavior normal.        Thought Content: Thought content normal.        Judgment: Judgment normal.        Results for orders placed or performed in visit on 09/06/23  POC Covid19/Flu A&B Antigen   Collection Time: 09/06/23  2:48 PM  Result Value Ref Range   Influenza A Antigen, POC Negative Negative   Influenza B Antigen, POC Negative Negative   Covid Antigen, POC Negative Negative  Comprehensive metabolic panel   Collection Time: 09/06/23  3:00 PM  Result Value Ref Range   Glucose 82 70 - 99 mg/dL   BUN 6 6 - 20 mg/dL   Creatinine, Ser 1.61 0.57 - 1.00 mg/dL   eGFR 096 >04 VW/UJW/1.19   BUN/Creatinine Ratio 11 9 - 23   Sodium 140 134 - 144 mmol/L   Potassium 4.4 3.5 - 5.2 mmol/L   Chloride 103 96 - 106 mmol/L   CO2 23 20 - 29 mmol/L   Calcium 9.4 8.7 - 10.2 mg/dL   Total Protein 6.6 6.0 - 8.5 g/dL   Albumin 4.3 4.0 - 5.0 g/dL   Globulin, Total 2.3 1.5 - 4.5 g/dL   Bilirubin Total 0.4 0.0 - 1.2 mg/dL   Alkaline Phosphatase 84 44 - 121 IU/L   AST 21 0 - 40 IU/L   ALT 16 0 - 32 IU/L  Lipid panel   Collection Time: 09/06/23  3:00 PM  Result Value Ref Range   Cholesterol, Total 179 100 - 199 mg/dL   Triglycerides 147 (H) 0 - 149 mg/dL   HDL 41 >82 mg/dL   VLDL Cholesterol Cal 39 5 - 40 mg/dL   LDL Chol Calc (NIH) 99 0 - 99 mg/dL   Chol/HDL Ratio 4.4 0.0 - 4.4 ratio  CBC w/Diff/Platelet   Collection Time: 09/06/23  3:00 PM  Result Value Ref Range   WBC 12.3 (H) 3.4 - 10.8 x10E3/uL   RBC 4.96 3.77 - 5.28 x10E6/uL   Hemoglobin 14.0 11.1 - 15.9 g/dL   Hematocrit 95.6 21.3 - 46.6 %   MCV 83 79 - 97 fL   MCH 28.2 26.6 - 33.0 pg   MCHC 34.0 31.5 - 35.7 g/dL   RDW 08.6 57.8 - 46.9 %   Platelets 228 150 - 450 x10E3/uL   Neutrophils 73 Not Estab. %  Lymphs 18 Not Estab. %   Monocytes 5 Not Estab. %   Eos 4 Not Estab. %   Basos 0 Not Estab. %   Neutrophils Absolute 9.0 (H) 1.4  - 7.0 x10E3/uL   Lymphocytes Absolute 2.2 0.7 - 3.1 x10E3/uL   Monocytes Absolute 0.6 0.1 - 0.9 x10E3/uL   EOS (ABSOLUTE) 0.5 (H) 0.0 - 0.4 x10E3/uL   Basophils Absolute 0.0 0.0 - 0.2 x10E3/uL   Immature Granulocytes 0 Not Estab. %   Immature Grans (Abs) 0.0 0.0 - 0.1 x10E3/uL  Iron, TIBC and Ferritin Panel   Collection Time: 09/06/23  3:00 PM  Result Value Ref Range   Total Iron Binding Capacity 271 250 - 450 ug/dL   UIBC 270 623 - 762 ug/dL   Iron 831 27 - 517 ug/dL   Iron Saturation 41 15 - 55 %   Ferritin 79 15 - 150 ng/mL          Assessment & Plan:   Problem List Items Addressed This Visit   None Visit Diagnoses       Non-recurrent acute suppurative otitis media of left ear without spontaneous rupture of tympanic membrane    -  Primary   Relevant Medications   amoxicillin -clavulanate (AUGMENTIN ) 875-125 MG tablet     Acute non-recurrent pansinusitis       Relevant Medications   amoxicillin -clavulanate (AUGMENTIN ) 875-125 MG tablet   promethazine -dextromethorphan (PROMETHAZINE -DM) 6.25-15 MG/5ML syrup     Sore throat       Relevant Medications   amoxicillin -clavulanate (AUGMENTIN ) 875-125 MG tablet     Acute cough       Relevant Medications   promethazine -dextromethorphan (PROMETHAZINE -DM) 6.25-15 MG/5ML syrup        Assessment and Plan Assessment & Plan Acute otitis media, left ear Left ear infection with symptoms of earache, congestion, and headache. Treatment will also cover potential strep throat infection, eliminating the need for a strep test. - Prescribe Augmentin   Acute sinusitis Sinus infection with congestion, headache, and cough persisting for one week, indicating a bacterial cause. - Prescribe Augmentin  - Recommend over-the-counter allergy medications such as Zyrtec or Claritin - Recommend Flonase  nasal steroid - Recommend Mucinex to thin mucus  Acute pharyngitis Sore throat with difficulty swallowing, likely viral but treated with  antibiotics due to concurrent ear infection. Symptoms include fever, sore throat, and swollen lymph nodes. Augmentin  will cover potential bacterial causes including strep throat. Phenergan  DM syrup is prescribed for cough management, with caution advised due to potential drowsiness. - Prescribe Augmentin  - Prescribe Phenergan  DM syrup for cough, noting it may cause drowsiness        Follow up plan: Return if symptoms worsen or fail to improve.

## 2024-03-01 ENCOUNTER — Other Ambulatory Visit: Payer: Self-pay

## 2024-03-01 ENCOUNTER — Emergency Department
Admission: EM | Admit: 2024-03-01 | Discharge: 2024-03-01 | Disposition: A | Discharge status: Home / Self Care | Attending: Emergency Medicine | Admitting: Emergency Medicine

## 2024-03-01 ENCOUNTER — Emergency Department

## 2024-03-01 ENCOUNTER — Encounter: Payer: Self-pay | Admitting: Emergency Medicine

## 2024-03-01 DIAGNOSIS — D72829 Elevated white blood cell count, unspecified: Secondary | ICD-10-CM | POA: Diagnosis not present

## 2024-03-01 DIAGNOSIS — X58XXXA Exposure to other specified factors, initial encounter: Secondary | ICD-10-CM | POA: Diagnosis not present

## 2024-03-01 DIAGNOSIS — Y732 Prosthetic and other implants, materials and accessory gastroenterology and urology devices associated with adverse incidents: Secondary | ICD-10-CM | POA: Diagnosis not present

## 2024-03-01 DIAGNOSIS — S39011A Strain of muscle, fascia and tendon of abdomen, initial encounter: Secondary | ICD-10-CM | POA: Insufficient documentation

## 2024-03-01 DIAGNOSIS — T8332XA Displacement of intrauterine contraceptive device, initial encounter: Secondary | ICD-10-CM | POA: Insufficient documentation

## 2024-03-01 DIAGNOSIS — R109 Unspecified abdominal pain: Secondary | ICD-10-CM | POA: Diagnosis not present

## 2024-03-01 DIAGNOSIS — R1012 Left upper quadrant pain: Secondary | ICD-10-CM | POA: Diagnosis present

## 2024-03-01 LAB — URINALYSIS, ROUTINE W REFLEX MICROSCOPIC
Bilirubin Urine: NEGATIVE
Glucose, UA: NEGATIVE mg/dL
Hgb urine dipstick: NEGATIVE
Ketones, ur: NEGATIVE mg/dL
Leukocytes,Ua: NEGATIVE
Nitrite: NEGATIVE
Protein, ur: NEGATIVE mg/dL
Specific Gravity, Urine: 1.018 (ref 1.005–1.030)
pH: 5 (ref 5.0–8.0)

## 2024-03-01 LAB — COMPREHENSIVE METABOLIC PANEL WITH GFR
ALT: 15 U/L (ref 0–44)
AST: 17 U/L (ref 15–41)
Albumin: 3.8 g/dL (ref 3.5–5.0)
Alkaline Phosphatase: 53 U/L (ref 38–126)
Anion gap: 8 (ref 5–15)
BUN: 6 mg/dL (ref 6–20)
CO2: 23 mmol/L (ref 22–32)
Calcium: 9.1 mg/dL (ref 8.9–10.3)
Chloride: 105 mmol/L (ref 98–111)
Creatinine, Ser: 0.53 mg/dL (ref 0.44–1.00)
GFR, Estimated: >60 mL/min (ref >60)
Glucose, Bld: 93 mg/dL (ref 70–99)
Potassium: 3.7 mmol/L (ref 3.5–5.1)
Sodium: 136 mmol/L (ref 135–145)
Total Bilirubin: 0.8 mg/dL (ref 0.0–1.2)
Total Protein: 7.4 g/dL (ref 6.5–8.1)

## 2024-03-01 LAB — CBC
HCT: 40.8 % (ref 36.0–46.0)
Hemoglobin: 14.5 g/dL (ref 12.0–15.0)
MCH: 28.7 pg (ref 26.0–34.0)
MCHC: 35.5 g/dL (ref 30.0–36.0)
MCV: 80.6 fL (ref 80.0–100.0)
Platelets: 253 10*3/uL (ref 150–400)
RBC: 5.06 MIL/uL (ref 3.87–5.11)
RDW: 12.7 % (ref 11.5–15.5)
WBC: 12.4 10*3/uL — ABNORMAL HIGH (ref 4.0–10.5)
nRBC: 0 % (ref 0.0–0.2)

## 2024-03-01 LAB — LIPASE, BLOOD: Lipase: 26 U/L (ref 11–51)

## 2024-03-01 LAB — POC URINE PREG, ED: Preg Test, Ur: NEGATIVE

## 2024-03-01 MED ORDER — CYCLOBENZAPRINE HCL 10 MG PO TABS
10.0000 mg | ORAL_TABLET | Freq: Once | ORAL | Status: AC
  Administered 2024-03-01: 10 mg via ORAL
  Filled 2024-03-01: qty 1

## 2024-03-01 MED ORDER — KETOROLAC TROMETHAMINE 30 MG/ML IJ SOLN
30.0000 mg | Freq: Once | INTRAMUSCULAR | Status: AC
  Administered 2024-03-01: 30 mg via INTRAVENOUS
  Filled 2024-03-01: qty 1

## 2024-03-01 MED ORDER — CYCLOBENZAPRINE HCL 5 MG PO TABS: 5.0000 mg | ORAL_TABLET | Freq: Three times a day (TID) | ORAL | 0 refills | Status: DC | PRN

## 2024-03-01 MED ORDER — ONDANSETRON HCL 4 MG/2ML IJ SOLN
4.0000 mg | Freq: Once | INTRAMUSCULAR | Status: AC
  Administered 2024-03-01: 4 mg via INTRAVENOUS
  Filled 2024-03-01: qty 2

## 2024-03-01 MED ORDER — MORPHINE SULFATE (PF) 4 MG/ML IV SOLN
4.0000 mg | Freq: Once | INTRAVENOUS | Status: AC
  Administered 2024-03-01: 4 mg via INTRAVENOUS
  Filled 2024-03-01: qty 1

## 2024-03-01 MED ORDER — IBUPROFEN 800 MG PO TABS
800.0000 mg | ORAL_TABLET | Freq: Three times a day (TID) | ORAL | 0 refills | Status: AC | PRN
Start: 2024-03-01 — End: 2024-03-11

## 2024-03-01 MED ORDER — LIDOCAINE 5 % EX PTCH: 1.0000 | MEDICATED_PATCH | Freq: Two times a day (BID) | CUTANEOUS | 0 refills | Status: AC | PRN

## 2024-03-01 MED ORDER — LIDOCAINE 5 % EX PTCH
1.0000 | MEDICATED_PATCH | Freq: Once | CUTANEOUS | Status: DC
  Administered 2024-03-01: 1 via TRANSDERMAL
  Filled 2024-03-01: qty 1

## 2024-03-01 MED ORDER — IOHEXOL 350 MG/ML SOLN
100.0000 mL | Freq: Once | INTRAVENOUS | Status: AC | PRN
  Administered 2024-03-01: 100 mL via INTRAVENOUS

## 2024-03-01 NOTE — Discharge Instructions (Addendum)
 Your exam, labs, and CT scan are normal and reassuring. Your symptoms appear to be related to abdominal wall muscle strain and soreness. You do have CT evidence of a migrated IUD. Please follow-up with your GYN provider for further intervention. Take the prescription meds as needed.

## 2024-03-01 NOTE — ED Provider Notes (Signed)
 [facility] Emergency Department Provider Note     Event Date/Time   First MD Initiated Contact with Patient 03/01/24 1823     (approximate)   History   Abdominal Pain   HPI  Terri [name] is a 28 y.o. female with a history of obesity, GAD, PTSD, and depression, who presents to the ED endorsing left upper quadrant abdominal discomfort.  Patient endorsed 3 days of symptoms.  No reports of any nausea or vomiting, bowel changes reported.  Patient continues to report pain not affected by OTC pain medicines.  She notes symptoms are improved by forward flexed position at the waist.  Aggravating features include stretching or extend the abdominal wall.  After further investigation, patient will report onset of symptoms after she was lifting some heavy objects at work, overhead on her last shift.  Patient denies any shortness of breath, chest pain, or cough.  Physical Exam   Triage Vital Signs: ED Triage Vitals  Encounter Vitals Group     BP 03/01/24 1757 (!) 153/120     Girls Systolic BP Percentile --      Girls Diastolic BP Percentile --      Boys Systolic BP Percentile --      Boys Diastolic BP Percentile --      Pulse Rate 03/01/24 1757 (!) 103     Resp 03/01/24 1757 17     Temp 03/01/24 1757 98.4 F (36.9 C)     Temp Source 03/01/24 1757 Oral     SpO2 03/01/24 1757 94 %     Weight 03/01/24 1755 240 lb (108.9 kg)     Height 03/01/24 1755 5' 3 (1.6 m)     Head Circumference --      Peak Flow --      Pain Score 03/01/24 1755 9     Pain Loc --      Pain Education --      Exclude from Growth Chart --     Most recent vital signs: Vitals:   03/01/24 1948 03/01/24 2206  BP: (!) 143/95 117/75  Pulse: 72 60  Resp:  16  Temp:  98.9 F (37.2 C)  SpO2: 98% 97%    General Awake, no distress. NAD HEENT NCAT. PERRL. EOMI. No rhinorrhea. Mucous membranes are moist.  CV:  Good peripheral perfusion.  RESP:  Normal effort.  ABD:  No distention.   Normal bowel sounds x 4.  Patient with tenderness palpation over the anterior musculature including the anterior left rib border and oblique musculature.  No organomegaly noted.  No CVA tenderness elicited. MSK:  AROM of all extremities   ED Results / Procedures / Treatments   Labs (all labs ordered are listed, but only abnormal results are displayed) Labs Reviewed  CBC - Abnormal; Notable for the following components:      Result Value   WBC 12.4 (*)    All other components within normal limits  URINALYSIS, ROUTINE W REFLEX MICROSCOPIC - Abnormal; Notable for the following components:   Color, Urine YELLOW (*)    APPearance HAZY (*)    All other components within normal limits  LIPASE, BLOOD  COMPREHENSIVE METABOLIC PANEL WITH GFR  POC URINE PREG, ED    EKG   RADIOLOGY  I personally viewed and evaluated these images as part of my medical decision making, as well as reviewing the written report by the radiologist.  ED Provider Interpretation: No acute CT findings  CT ABD/PELVIS w/  CM  IMPRESSION: 1. No bowel obstruction. Normal appendix. 2. Dislodged intrauterine device. Gynecology referral is advised.   PROCEDURES:  Critical Care performed: No  Procedures   MEDICATIONS ORDERED IN ED: Medications  ondansetron  (ZOFRAN ) injection 4 mg (4 mg Intravenous Given 03/01/24 2015)  morphine  (PF) 4 MG/ML injection 4 mg (4 mg Intravenous Given 03/01/24 2016)  iohexol  (OMNIPAQUE ) 350 MG/ML injection 100 mL (100 mLs Intravenous Contrast Given 03/01/24 2022)  cyclobenzaprine  (FLEXERIL ) tablet 10 mg (10 mg Oral Given 03/01/24 2207)  ketorolac  (TORADOL ) 30 MG/ML injection 30 mg (30 mg Intravenous Given 03/01/24 2136)     IMPRESSION / MDM / ASSESSMENT AND PLAN / ED COURSE  I reviewed the triage vital signs and the nursing notes.                              Differential diagnosis includes, but is not limited to, biliary disease (biliary colic, acute cholecystitis, cholangitis,  choledocholithiasis, etc), intrathoracic causes for epigastric abdominal pain including ACS, gastritis, duodenitis, pancreatitis, small bowel or large bowel obstruction, abdominal aortic aneurysm, hernia, and ulcer(s).  Patient's presentation is most consistent with acute complicated illness / injury requiring diagnostic workup.   Patient's diagnosis is consistent with abdominal wall muscle strain.  Patient presenting with abdominal wall muscle pain described as left upper quadrant abdominal discomfort.  Patient found to have reproduced pain to the anterior abdominal wall on palpation.  No red flags on exam.  Labs overall reassuring, except for a nonspecific WBC elevation of 12.4.  No other abnormalities noted.  CT imaging reviewed interpreted by me, reveals a normal scan with no acute intrathoracic or pelvic process.  Patient was found incidentally to have evidence of a migrated IUD.  Patient with symptoms currently resolved following normal workup in ED medication administration.  Patient will be discharged home with prescriptions for Lidoderm  patches, ibuprofen , and cyclobenzaprine . Patient is to follow up with OBG as directed for ongoing evaluation management of her displaced IUD as needed or otherwise directed. Patient is given ED precautions to return to the ED for any worsening or new symptoms.   FINAL CLINICAL IMPRESSION(S) / ED DIAGNOSES   Final diagnoses:  Abdominal muscle strain, initial encounter  IUD migration, initial encounter     Rx / DC Orders   ED Discharge Orders          Ordered    ibuprofen  (ADVIL ) 800 MG tablet  Every 8 hours PRN        03/01/24 2050    cyclobenzaprine  (FLEXERIL ) 5 MG tablet  3 times daily PRN        03/01/24 2050    lidocaine  (LIDODERM ) 5 %  Every 12 hours PRN        03/01/24 2058             Note:  This document was prepared using Dragon voice recognition software and may include unintentional dictation errors.    Loyd Candida LULLA Aldona,  PA-C 03/04/24 2323    Dorothyann Drivers, MD 03/14/24 [phone]

## 2024-03-01 NOTE — ED Triage Notes (Signed)
 Patient to ED via POV for left upper abd pain. Ongoing x3 days. Denies N/V/D.

## 2024-03-06 ENCOUNTER — Ambulatory Visit: Payer: Self-pay

## 2024-03-06 ENCOUNTER — Ambulatory Visit (INDEPENDENT_AMBULATORY_CARE_PROVIDER_SITE_OTHER): Admitting: Physician Assistant

## 2024-03-06 VITALS — BP 152/113 | HR 79

## 2024-03-06 DIAGNOSIS — I10 Essential (primary) hypertension: Secondary | ICD-10-CM

## 2024-03-06 DIAGNOSIS — R102 Pelvic and perineal pain: Secondary | ICD-10-CM | POA: Diagnosis not present

## 2024-03-06 DIAGNOSIS — I158 Other secondary hypertension: Secondary | ICD-10-CM

## 2024-03-06 DIAGNOSIS — R1012 Left upper quadrant pain: Secondary | ICD-10-CM | POA: Diagnosis not present

## 2024-03-06 NOTE — Telephone Encounter (Signed)
  FYI Only or Action Required?: FYI only for provider.  Patient was last seen in primary care on 01/27/2024 by Gareth Mliss FALCON, FNP. Called Nurse Triage reporting Contraception. Symptoms began a week ago. Interventions attempted: Other: went to ED. Symptoms are: gradually worsening.  Triage Disposition: See PCP When Office is Open (Within 3 Days)  Patient/caregiver understands and will follow disposition?: Yes   Copied from CRM [id number]. Topic: Clinical - Red Word Triage >> Mar 06, 2024  8:09 AM Heather  M wrote: Red Word that prompted transfer to Nurse Triage: Abdominal pain, patient states imaging in ER shows IUD has moved inside her and is shifted upside down, she is having a little bleeding. Been missing work. She was sent her home from ER and she doesn't/t know what to do. The IUD has not been removed.  Reason for Disposition  IUD came out (e.g., has IUD in her hand)    IUD has become displaced/moved.  Answer Assessment - Initial Assessment Questions 1. TYPE: What type of IUD do you have?    - Copper IUD (e.g., Cu-T380A, ParaGard, Nova-T, Flexi-T)   - Hormonal IUD (e.g., Jaydess, Kyleena , Liletta , LNG-IUS, Mirena , Skyla )     IUD Kyleena  2. START DATE:  When was your IUD inserted? (e.g., date; weeks, months, years ago)      10/25/202 3. /SYMPTOM: What is the main symptom (or question) you're concerned about?      Bleeding, abd pain at times 4. ONSET: When did the   start?     Wednesday 5. VAGINAL BLEEDING: Are you having any unusual vaginal bleeding?    - NONE: no bleeding   - SPOTTING: spotting, or pinkish / brownish mucous discharge; does not fill panty liner or pad    - MILD:  less than 1 pad / hour; less than patient's usual menstrual bleeding   - MODERATE: 1-2 pads / hour; 1 menstrual cup every 6 hours; small-medium blood clots (e.g., pea, grape, small coin)   - SEVERE: soaking 2 or more pads/hour for 2 or more hours; 1 menstrual cup every 2 hours; bleeding not  contained by pads or continuous red blood from vagina; large blood clots (e.g., golf ball, large coin)      mild 6. ABDOMEN OR PELVIC PAIN: Are you having any pain in your abdomen or pelvic area? (Scale: 0, 1-10; none, mild, moderate, severe)   - NONE (0): no pain   - MILD (1-3): doesn't interfere with normal activities, abdomen soft and not tender to touch    - MODERATE (4-7): interferes with normal activities or awakens from sleep, abdomen tender to touch    - SEVERE (8-10): excruciating pain, doubled over, unable to do any normal activities      Mild to moderate 7. FEVER:Is there a fever? If Yes, ask: What is the temperature, how was it measured, and when did it start?     no 8. PREGNANCY: Are you concerned that you might be pregnant? When was your last menstrual period?     no  Protocols used: Contraception - IUD Symptoms and Questions-A-AH

## 2024-03-06 NOTE — Progress Notes (Unsigned)
 Established patient visit  Patient: Terri [name]   DOB: Jan 06, 1996   27 y.o. Female  MRN: [id number] Visit Date: 03/06/2024  Today's healthcare provider: Jolynn Spencer, PA-C   Chief Complaint  Patient presents with   Follow-up    Patient is present to ask question in regards to ER visit on 03/01/24 as she was advised a urgent referral to GYN would be placed based off provider reviewing discharge summary. She reports being okay with a referral but would like to know is she okay to return back to work and be active until she is seen. What are he risk? How is device removed? She reports pain still being present and having some palpitations    Subjective     HPI     Follow-up    Additional comments: Patient is present to ask question in regards to ER visit on 03/01/24 as she was advised a urgent referral to GYN would be placed based off provider reviewing discharge summary. She reports being okay with a referral but would like to know is she okay to return back to work and be active until she is seen. What are he risk? How is device removed? She reports pain still being present and having some palpitations       Last edited by Lilian Fitzpatrick, [name] on 03/06/2024  2:43 PM.       Discussed the use of AI scribe software for clinical note transcription with the patient, who gave verbal consent to proceed.  History of Present Illness Terri [name] is a 28 year old female who presents with abdominal pain and elevated white blood cell count.  She experiences cramping pain in the left upper quadrant with a dull burning sensation and regular cramping in the lower abdomen. The pain began on Friday, initially excruciating, now rated 5 out of 10. No recent unusual food or drink intake. Pain occurs randomly, regardless of activity.  She has elevated blood pressure at 177/112, which is atypical for her. No history of hypertension and not on blood pressure medication. A recent emergency room  visit showed a white blood cell count of 12.4. She received Ketorolac  and ibuprofen  for inflammation and pain, and is taking muscle relaxers without relief.  No fever, diabetes, or recent weight loss. She started her menstrual period last night. No urinary problems or blood in stool or urine. She has acid reflux but stopped medications two months ago. She is concerned about returning to work due to her symptoms.       01/27/2024    1:34 PM 09/06/2023    2:26 PM 06/15/2023    8:58 AM  Depression screen PHQ 2/9  Decreased Interest 0 0 2  Down, Depressed, Hopeless 0 0 1  PHQ - 2 Score 0 0 3  Altered sleeping 0 0 3  Tired, decreased energy 0 1 3  Change in appetite 0 0 0  Feeling bad or failure about yourself  0 0 0  Trouble concentrating 0 1 2  Moving slowly or fidgety/restless 0 0 0  Suicidal thoughts 0 0 0  PHQ-9 Score 0 2 11  Difficult doing work/chores Not difficult at all Not difficult at all Somewhat difficult      01/27/2024    1:34 PM 09/06/2023    2:26 PM 06/03/2023    9:17 AM 05/05/2023    3:36 PM  GAD 7 : Generalized Anxiety Score  Nervous, Anxious, on Edge 0 1 2   Control/stop worrying 0 1  1   Worry too much - different things 0 0 1   Trouble relaxing 0 0 3   Restless 0 0 0   Easily annoyed or irritable 0 1 3   Afraid - awful might happen 0 2 0   Total GAD 7 Score 0 5 10   Anxiety Difficulty Not difficult at all Somewhat difficult Somewhat difficult      Information is confidential and restricted. Go to Review Flowsheets to unlock data.    Medications: Outpatient Medications Prior to Visit  Medication Sig   acetaminophen  (TYLENOL ) 325 MG tablet Take 650 mg by mouth every 6 (six) hours as needed for moderate pain or headache.   albuterol  (VENTOLIN  HFA) 108 (90 Base) MCG/ACT inhaler Inhale 2 puffs into the lungs every 6 (six) hours as needed for wheezing or shortness of breath.   budesonide -formoterol  (SYMBICORT ) 160-4.5 MCG/ACT inhaler Inhale 2 puffs into the lungs 2  (two) times daily.   cyclobenzaprine  (FLEXERIL ) 5 MG tablet Take 1 tablet (5 mg total) by mouth 3 (three) times daily as needed.   ibuprofen  (ADVIL ) 800 MG tablet Take 1 tablet (800 mg total) by mouth every 8 (eight) hours as needed for up to 10 days for moderate pain (pain score 4-6).   ipratropium (ATROVENT ) 0.03 % nasal spray Place 2 sprays into both nostrils every 12 (twelve) hours. (Patient not taking: Reported on 01/27/2024)   lamoTRIgine  (LAMICTAL ) 25 MG tablet Take 3 tablets (75 mg total) by mouth daily. Needs appointment prior to future refills   levonorgestrel  (KYLEENA ) 19.5 MG IUD 1 each by Intrauterine route once.   lidocaine  (LIDODERM ) 5 % Place 1 patch onto the skin every 12 (twelve) hours as needed for up to 10 days. Remove & Discard patch after 12 hours of wear each day.   nortriptyline  (PAMELOR ) 10 MG capsule Take 4 capsules (40 mg total) by mouth at bedtime. (Patient not taking: Reported on 01/27/2024)   prazosin  (MINIPRESS ) 1 MG capsule TAKE 1 CAPSULE BY MOUTH AT BEDTIME.   propranolol  (INDERAL ) 10 MG tablet Take 2 tablets (20 mg total) by mouth 2 (two) times daily as needed. For anxiety attacks   No facility-administered medications prior to visit.    Review of Systems All negative Except see HPI   {Insert previous labs (optional):23779} {See past labs  Heme  Chem  Endocrine  Serology  Results Review (optional):1}   Objective    BP (!) 152/113 (BP Location: Left Arm, Patient Position: Sitting, Cuff Size: Normal)   Pulse 79   SpO2 99%  {Insert last BP/Wt (optional):23777}{See vitals history (optional):1}   Physical Exam Vitals reviewed.  Constitutional:      General: She is not in acute distress.    Appearance: Normal appearance. She is well-developed. She is not diaphoretic.  HENT:     Head: Normocephalic and atraumatic.  Eyes:     General: No scleral icterus.    Conjunctiva/sclera: Conjunctivae normal.  Neck:     Thyroid : No thyromegaly.   Cardiovascular:     Rate and Rhythm: Normal rate and regular rhythm.     Pulses: Normal pulses.     Heart sounds: Normal heart sounds. No murmur heard. Pulmonary:     Effort: Pulmonary effort is normal. No respiratory distress.     Breath sounds: Normal breath sounds. No wheezing, rhonchi or rales.  Musculoskeletal:     Cervical back: Neck supple.     Right lower leg: No edema.     Left lower leg:  No edema.  Lymphadenopathy:     Cervical: No cervical adenopathy.  Skin:    General: Skin is warm and dry.     Findings: No rash.  Neurological:     Mental Status: She is alert and oriented to person, place, and time. Mental status is at baseline.  Psychiatric:        Mood and Affect: Mood normal.        Behavior: Behavior normal.      No results found for any visits on 03/06/24.      Assessment & Plan Dislodged intrauterine device (IUD) Dislodged IUD causing cramping pain, elevated WBC suggests possible inflammation or infection. - Urgent referral to gynecologist. - Advised to expedite OBGYN appointment. - Monitor for infection signs: fever, increased pain. - Ibuprofen  with meals, alternate with acetaminophen  for pain.  Abdominal pain Left upper quadrant and pelvic pain, possible gastrointestinal etiology given history of acid reflux and NSAID use. - Order H. pylori test. - Repeat CBC to monitor WBC. - Encourage hydration, easy-to-digest foods. - Perform urinalysis.  Hypertension Blood pressure 177/112, second reading was 152/113 likely secondary to pain or stress from current issues. - Monitor blood pressure. - Reassess after addressing pain and stress.  Left upper quadrant abdominal pain   - H. pylori breath test - CBC w/Diff/Platelet - Urinalysis, Routine w reflex microscopic CT from 03/01/24 reassuring.  Pelvic pain Labs from 03/01/24 were WNL except WBC - Ambulatory referral to Gynecology - Urinalysis, Routine w reflex microscopic    Orders Placed This  Encounter  Procedures   H. pylori breath test   CBC w/Diff/Platelet   Urinalysis, Routine w reflex microscopic   Ambulatory referral to Gynecology    Referral Priority:   Urgent    Referral Type:   Consultation    Referral Reason:   Specialty Services Required    Requested Specialty:   Gynecology    Number of Visits Requested:   1    No follow-ups on file.   The patient was advised to call back or seek an in-person evaluation if the symptoms worsen or if the condition fails to improve as anticipated.  I discussed the assessment and treatment plan with the patient. The patient was provided an opportunity to ask questions and all were answered. The patient agreed with the plan and demonstrated an understanding of the instructions.  I, Jamell Opfer, PA-C have reviewed all documentation for this visit. The documentation on 03/06/2024  for the exam, diagnosis, procedures, and orders are all accurate and complete.  Jolynn Spencer, [facility], MMS [facility] [phone] (phone) [phone] (fax)  [facility]

## 2024-03-06 NOTE — Telephone Encounter (Signed)
 Copied from CRM [id number]. Topic: Clinical - Red Word Triage >> Mar 06, 2024  8:09 AM Heather  M wrote: Red Word that prompted transfer to Nurse Triage: Abdominal pain, patient states imaging in ER shows IUD has moved inside her and is shifted upside down, she is having a little bleeding. Been missing work. She was sent her home from ER and she doesn't/t know what to do. The IUD has not been removed.  Unable to triage patient due to call disconnecting after speaking with agent. Reason for Disposition . IUD came out (e.g., has IUD in her hand)    IUD has become displaced/moved.  Answer Assessment - Initial Assessment Questions 1. TYPE: What type of IUD do you have?    - Copper IUD (e.g., Cu-T380A, ParaGard, Nova-T, Flexi-T)   - Hormonal IUD (e.g., Jaydess, Kyleena , Liletta , LNG-IUS, Mirena , Skyla )     IUD Kyleena  2. START DATE:  When was your IUD inserted? (e.g., date; weeks, months, years ago)      10/25/202 3. /SYMPTOM: What is the main symptom (or question) you're concerned about?      Bleeding, abd pain at times 4. ONSET: When did the   start?     Wednesday 5. VAGINAL BLEEDING: Are you having any unusual vaginal bleeding?    - NONE: no bleeding   - SPOTTING: spotting, or pinkish / brownish mucous discharge; does not fill panty liner or pad    - MILD:  less than 1 pad / hour; less than patient's usual menstrual bleeding   - MODERATE: 1-2 pads / hour; 1 menstrual cup every 6 hours; small-medium blood clots (e.g., pea, grape, small coin)   - SEVERE: soaking 2 or more pads/hour for 2 or more hours; 1 menstrual cup every 2 hours; bleeding not contained by pads or continuous red blood from vagina; large blood clots (e.g., golf ball, large coin)      mild 6. ABDOMEN OR PELVIC PAIN: Are you having any pain in your abdomen or pelvic area? (Scale: 0, 1-10; none, mild, moderate, severe)   - NONE (0): no pain   - MILD (1-3): doesn't interfere with normal activities, abdomen soft and not  tender to touch    - MODERATE (4-7): interferes with normal activities or awakens from sleep, abdomen tender to touch    - SEVERE (8-10): excruciating pain, doubled over, unable to do any normal activities      Mild to moderate 7. FEVER:Is there a fever? If Yes, ask: What is the temperature, how was it measured, and when did it start?     no 8. PREGNANCY: Are you concerned that you might be pregnant? When was your last menstrual period?     no  Protocols used: Contraception - IUD Symptoms and Questions-A-AH

## 2024-03-07 ENCOUNTER — Ambulatory Visit: Admitting: Licensed Practical Nurse

## 2024-03-07 ENCOUNTER — Ambulatory Visit: Payer: Self-pay | Admitting: Physician Assistant

## 2024-03-07 ENCOUNTER — Encounter: Payer: Self-pay | Admitting: Physician Assistant

## 2024-03-07 ENCOUNTER — Encounter: Payer: Self-pay | Admitting: Licensed Practical Nurse

## 2024-03-07 ENCOUNTER — Other Ambulatory Visit (HOSPITAL_COMMUNITY)
Admission: RE | Admit: 2024-03-07 | Discharge: 2024-03-07 | Disposition: A | Discharge status: Home / Self Care | Source: Ambulatory Visit | Attending: Licensed Practical Nurse | Admitting: Licensed Practical Nurse

## 2024-03-07 VITALS — BP 124/81 | HR 96 | Ht 63.0 in | Wt 247.9 lb

## 2024-03-07 DIAGNOSIS — Z30432 Encounter for removal of intrauterine contraceptive device: Secondary | ICD-10-CM | POA: Diagnosis not present

## 2024-03-07 DIAGNOSIS — Z30011 Encounter for initial prescription of contraceptive pills: Secondary | ICD-10-CM

## 2024-03-07 DIAGNOSIS — Z113 Encounter for screening for infections with a predominantly sexual mode of transmission: Secondary | ICD-10-CM

## 2024-03-07 LAB — MICROSCOPIC EXAMINATION
Bacteria, UA: NONE SEEN
Casts: NONE SEEN /LPF
Epithelial Cells (non renal): NONE SEEN /HPF (ref 0–10)
WBC, UA: NONE SEEN /HPF (ref 0–5)

## 2024-03-07 LAB — URINALYSIS, ROUTINE W REFLEX MICROSCOPIC
Bilirubin, UA: NEGATIVE
Glucose, UA: NEGATIVE
Ketones, UA: NEGATIVE
Nitrite, UA: NEGATIVE
Protein,UA: NEGATIVE
Specific Gravity, UA: 1.021 (ref 1.005–1.030)
Urobilinogen, Ur: 0.2 mg/dL (ref 0.2–1.0)
pH, UA: 6.5 (ref 5.0–7.5)

## 2024-03-07 LAB — CBC WITH DIFFERENTIAL/PLATELET
Basophils Absolute: 0 x10E3/uL (ref 0.0–0.2)
Basos: 0 %
EOS (ABSOLUTE): 0.3 x10E3/uL (ref 0.0–0.4)
Eos: 3 %
Hematocrit: 42.5 % (ref 34.0–46.6)
Hemoglobin: 14.4 g/dL (ref 11.1–15.9)
Immature Grans (Abs): 0.1 x10E3/uL (ref 0.0–0.1)
Immature Granulocytes: 1 %
Lymphocytes Absolute: 3 x10E3/uL (ref 0.7–3.1)
Lymphs: 26 %
MCH: 29.1 pg (ref 26.6–33.0)
MCHC: 33.9 g/dL (ref 31.5–35.7)
MCV: 86 fL (ref 79–97)
Monocytes Absolute: 0.6 x10E3/uL (ref 0.1–0.9)
Monocytes: 5 %
Neutrophils Absolute: 7.4 x10E3/uL — ABNORMAL HIGH (ref 1.4–7.0)
Neutrophils: 65 %
Platelets: 227 x10E3/uL (ref 150–450)
RBC: 4.95 x10E6/uL (ref 3.77–5.28)
RDW: 13.1 % (ref 11.7–15.4)
WBC: 11.5 x10E3/uL — ABNORMAL HIGH (ref 3.4–10.8)

## 2024-03-07 LAB — H. PYLORI BREATH TEST: H pylori Breath Test: NEGATIVE

## 2024-03-07 MED ORDER — LABETALOL HCL 100 MG PO TABS: 100.0000 mg | ORAL_TABLET | Freq: Two times a day (BID) | ORAL | 1 refills | Status: AC

## 2024-03-07 MED ORDER — NORETHINDRONE 0.35 MG PO TABS
1.0000 | ORAL_TABLET | Freq: Every day | ORAL | 11 refills | Status: AC
Start: 2024-03-07 — End: ?

## 2024-03-07 NOTE — Progress Notes (Signed)
 Myrla Jon HERO, MD   No chief complaint on file.   HPI:      Terri [name] is a 28 y.o. [phone] whose LMP was Patient's last menstrual period was 03/05/2024 (exact date)., presents today for IUD removal. Reports ED visit last Friday, CT scan showed IUD dislodged. Patient here to get IUD removed. Reports having issues with hormonal fluxcations in the past several months, low libido, irregular bleeding, anxiety and depression. Follows up with psych to manage mood, feels overall stable. Denies vaginal symptoms, abnormal discharge, burning, vaginal odor, back pain, frequency, urgency. Desires BTL and/or birth control pill for contraception.     Patient Active Problem List   Diagnosis Date Noted   Bipolar disorder, in full remission, most recent episode mixed (HCC) 08/13/2023   High risk medication use 05/05/2023   Social anxiety disorder 07/07/2022   Cocaine use disorder, moderate, in sustained remission (HCC) 07/07/2022   Cannabis use disorder, mild, in sustained remission 07/07/2022   Obesity during pregnancy, antepartum 06/24/2021   Late prenatal care affecting pregnancy in third trimester 06/24/2021   Marijuana use during pregnancy 06/24/2021   Left hand pain 10/30/2020   Arthralgia of both knees and bilateral hands.  10/30/2020   Family history of rheumatoid arthritis 10/30/2020   Headache disorder 10/30/2019   Blurred vision 10/16/2019   Supervision of other high risk pregnancy, antepartum 10/05/2019   Polyarthralgia 09/13/2019   Joint stiffness 09/13/2019   GAD (generalized anxiety disorder) 01/16/2019   Depression, major, single episode, severe (HCC) 01/16/2019   PTSD (post-traumatic stress disorder) 01/16/2019   Tobacco use disorder 01/16/2019   Cholestatic jaundice 11/13/2018    Past Surgical History:  Procedure Laterality Date   CHOLECYSTECTOMY N/A 11/04/2018   Procedure: LAPAROSCOPIC CHOLECYSTECTOMY;  Surgeon: Desiderio Schanz, MD;  Location: [facility] ORS;   Service: General;  Laterality: N/A;   DILATION AND EVACUATION N/A 07/13/2017   Procedure: DILATATION AND EVACUATION;  Surgeon: Ward, Mitzie BROCKS, MD;  Location: [facility] ORS;  Service: Gynecology;  Laterality: N/A;   ERCP N/A 11/14/2018   Procedure: ENDOSCOPIC RETROGRADE CHOLANGIOPANCREATOGRAPHY (ERCP);  Surgeon: Jinny Carmine, MD;  Location: [facility] ENDOSCOPY;  Service: Endoscopy;  Laterality: N/A;    Family History  Problem Relation Age of Onset   Drug abuse Mother    Arthritis Mother    Thyroid  disease Mother    Depression Mother    Anxiety disorder Mother    Suicidality Mother    ALS Maternal Grandfather    Anxiety disorder Maternal Grandmother    Depression Maternal Grandmother    COPD Maternal Grandmother    ADD / ADHD Son    Breast cancer Neg Hx    Colon cancer Neg Hx     Social History   Socioeconomic History   Marital status: Significant Other    Spouse name: Not on file   Number of children: 3   Years of education: Not on file   Highest education level: 12th grade  Occupational History   Occupation: SAHM  Tobacco Use   Smoking status: Former    Current packs/day: 0.50    Average packs/day: 0.5 packs/day for 8.0 years (4.0 ttl pk-yrs)    Types: Cigarettes   Smokeless tobacco: Never  Vaping Use   Vaping status: Never Used  Substance and Sexual Activity   Alcohol use: Yes    Comment: social   Drug use: Yes    Types: Marijuana, Cocaine   Sexual activity: Yes    Partners: Male  Birth control/protection: I.U.D.  Other Topics Concern   Not on file  Social History Narrative   Not on file   Social Drivers of Health   Financial Resource Strain: High Risk (03/06/2024)   Overall Financial Resource Strain (CARDIA)    Difficulty of Paying Living Expenses: Hard  Food Insecurity: Food Insecurity Present (03/06/2024)   Hunger Vital Sign    Worried About Running Out of Food in the Last Year: Sometimes true    Ran Out of Food in the Last Year: Sometimes true  Transportation  Needs: No Transportation Needs (03/06/2024)   PRAPARE - Administrator, Civil Service (Medical): No    Lack of Transportation (Non-Medical): No  Recent Concern: Transportation Needs - Unmet Transportation Needs (01/27/2024)   PRAPARE - Administrator, Civil Service (Medical): Yes    Lack of Transportation (Non-Medical): No  Physical Activity: Sufficiently Active (03/06/2024)   Exercise Vital Sign    Days of Exercise per Week: 5 days    Minutes of Exercise per Session: 150+ min  Stress: No Stress Concern Present (03/06/2024)   [employer] of Occupational Health - Occupational Stress Questionnaire    Feeling of Stress: Only a little  Recent Concern: Stress - Stress Concern Present (01/27/2024)   [employer] of Occupational Health - Occupational Stress Questionnaire    Feeling of Stress : Rather much  Social Connections: Socially Isolated (03/06/2024)   Social Connection and Isolation Panel    Frequency of Communication with Friends and Family: Twice a week    Frequency of Social Gatherings with Friends and Family: Once a week    Attends Religious Services: Never    Database administrator or Organizations: No    Attends Engineer, structural: Not on file    Marital Status: Never married  Catering manager Violence: Not on file    Outpatient Medications Prior to Visit  Medication Sig Dispense Refill   acetaminophen  (TYLENOL ) 325 MG tablet Take 650 mg by mouth every 6 (six) hours as needed for moderate pain or headache.     albuterol  (VENTOLIN  HFA) 108 (90 Base) MCG/ACT inhaler Inhale 2 puffs into the lungs every 6 (six) hours as needed for wheezing or shortness of breath. 8 g 2   budesonide -formoterol  (SYMBICORT ) 160-4.5 MCG/ACT inhaler Inhale 2 puffs into the lungs 2 (two) times daily. 1 each 3   ibuprofen  (ADVIL ) 800 MG tablet Take 1 tablet (800 mg total) by mouth every 8 (eight) hours as needed for up to 10 days for moderate pain (pain score 4-6). 30  tablet 0   labetalol  (NORMODYNE ) 100 MG tablet Take 1 tablet (100 mg total) by mouth 2 (two) times daily. 60 tablet 1   levonorgestrel  (KYLEENA ) 19.5 MG IUD 1 each by Intrauterine route once.     cyclobenzaprine  (FLEXERIL ) 5 MG tablet Take 1 tablet (5 mg total) by mouth 3 (three) times daily as needed. (Patient not taking: Reported on 03/07/2024) 15 tablet 0   lamoTRIgine  (LAMICTAL ) 25 MG tablet Take 3 tablets (75 mg total) by mouth daily. Needs appointment prior to future refills (Patient not taking: Reported on 03/07/2024) 90 tablet 0   lidocaine  (LIDODERM ) 5 % Place 1 patch onto the skin every 12 (twelve) hours as needed for up to 10 days. Remove & Discard patch after 12 hours of wear each day. (Patient not taking: Reported on 03/07/2024) 10 patch 0   prazosin  (MINIPRESS ) 1 MG capsule TAKE 1 CAPSULE BY MOUTH AT BEDTIME. (  Patient not taking: Reported on 03/07/2024) 30 capsule 1   propranolol  (INDERAL ) 10 MG tablet Take 2 tablets (20 mg total) by mouth 2 (two) times daily as needed. For anxiety attacks (Patient not taking: Reported on 03/07/2024) 120 tablet 0   No facility-administered medications prior to visit.      ROS:  Review of Systems  Constitutional: Negative.   HENT: Negative.    Eyes: Negative.   Respiratory: Negative.    Cardiovascular: Negative.   Gastrointestinal: Negative.   Endocrine: Negative.   Genitourinary:  Positive for menstrual problem.       Irregular bleeding   Musculoskeletal: Negative.   Skin: Negative.   Allergic/Immunologic: Negative.   Neurological: Negative.   Hematological: Negative.   Psychiatric/Behavioral:  The patient is nervous/anxious.        Depression     OBJECTIVE:   Vitals:  BP 124/81 (BP Location: Right Arm, Patient Position: Sitting, Cuff Size: Large)   Pulse 96   Ht 5' 3 (1.6 m)   Wt 112.4 kg   LMP 03/05/2024 (Exact Date)   BMI 43.91 kg/m   Physical Exam Constitutional:      Appearance: Normal appearance.  HENT:     Head:  Normocephalic.     Nose: Nose normal.     Mouth/Throat:     Mouth: Mucous membranes are moist.     Pharynx: Oropharynx is clear.  Eyes:     Pupils: Pupils are equal, round, and reactive to light.  Cardiovascular:     Rate and Rhythm: Regular rhythm.     Pulses: Normal pulses.     Heart sounds: Normal heart sounds.  Pulmonary:     Effort: Pulmonary effort is normal.     Breath sounds: Normal breath sounds.  Abdominal:     General: Abdomen is flat.     Palpations: Abdomen is soft.     Comments: No pelvic tenderness  Genitourinary:    General: Normal vulva.     Rectum: Normal.     Comments: Bimanual exam: Non tender, non gravis uterus. No adnexal masses, no tenderness. No uterus motion tenderness.  Musculoskeletal:        General: Normal range of motion.     Cervical back: Normal range of motion.  Neurological:     Mental Status: She is alert and oriented to person, place, and time.  Psychiatric:        Behavior: Behavior normal.        Thought Content: Thought content normal.        Judgment: Judgment normal.     Comments: Anxiety and depression     Results: Results for orders placed or performed in visit on 03/06/24 (from the past 24 hours)  H. pylori breath test     Status: None (Preliminary result)   Collection Time: 03/06/24  3:36 PM  Result Value Ref Range   H pylori Breath Test WILL FOLLOW    Narrative   Performed at:  [address], [place], [place]  [id number] Lab Director: Frankey Sas MD, Phone:  [phone]  CBC w/Diff/Platelet     Status: Abnormal   Collection Time: 03/06/24  3:36 PM  Result Value Ref Range   WBC 11.5 (H) 3.4 - 10.8 x10E3/uL   RBC 4.95 3.77 - 5.28 x10E6/uL   Hemoglobin 14.4 11.1 - 15.9 g/dL   Hematocrit 57.4 65.9 - 46.6 %   MCV 86 79 - 97 fL   MCH 29.1 26.6 - 33.0 pg  MCHC 33.9 31.5 - 35.7 g/dL   RDW 86.8 88.2 - 84.5 %   Platelets 227 150 - 450 x10E3/uL   Neutrophils 65 Not Estab. %   Lymphs 26 Not Estab. %    Monocytes 5 Not Estab. %   Eos 3 Not Estab. %   Basos 0 Not Estab. %   Neutrophils Absolute 7.4 (H) 1.4 - 7.0 x10E3/uL   Lymphocytes Absolute 3.0 0.7 - 3.1 x10E3/uL   Monocytes Absolute 0.6 0.1 - 0.9 x10E3/uL   EOS (ABSOLUTE) 0.3 0.0 - 0.4 x10E3/uL   Basophils Absolute 0.0 0.0 - 0.2 x10E3/uL   Immature Granulocytes 1 Not Estab. %   Immature Grans (Abs) 0.1 0.0 - 0.1 x10E3/uL   Narrative   Performed at:  [address] [facility] [facility] [address], [place], [place]  [id number] Lab Director: Frankey Sas MD, Phone:  [phone]  Urinalysis, Routine w reflex microscopic     Status: Abnormal   Collection Time: 03/06/24  3:36 PM  Result Value Ref Range   Specific Gravity, UA 1.021 1.005 - 1.030   pH, UA 6.5 5.0 - 7.5   Color, UA Yellow Yellow   Appearance Ur Clear Clear   Leukocytes,UA Trace (A) Negative   Protein,UA Negative Negative/Trace   Glucose, UA Negative Negative   Ketones, UA Negative Negative   RBC, UA 2+ (A) Negative   Bilirubin, UA Negative Negative   Urobilinogen, Ur 0.2 0.2 - 1.0 mg/dL   Nitrite, UA Negative Negative   Microscopic Examination See below:    Narrative   Performed at:  [address] [employer] [facility] [address], [place], [place]  [id number] Lab Director: Frankey Sas MD, Phone:  [phone]  Microscopic Examination     Status: None   Collection Time: 03/06/24  3:36 PM  Result Value Ref Range   WBC, UA None seen 0 - 5 /hpf   RBC, Urine 0-2 0 - 2 /hpf   Epithelial Cells (non renal) None seen 0 - 10 /hpf   Casts None seen None seen /lpf   Bacteria, UA None seen None seen/Few   Narrative   Performed at:  [address] [employer] [facility] [address], [place], [place]  [id number] Lab Director: Frankey Sas MD, Phone:  [phone]     Assessment/Plan: Encounter for IUD removal  Screening examination for venereal disease - Plan: HEP, RPR, HIV Panel, Hepatitis C antibody, Cervicovaginal ancillary only  Encounter for initial prescription of contraceptive pills  - Plan: norethindrone  (MICRONOR ) 0.35 MG tablet  Patient desires BTL. Has had several years to think about choice and has come back to a BTL. Discussed biggest side effect is regret. Patient reports she is happy with her 3 children and no longer desires more pregnancies. Medicaid insurance form signed today. Discussed setting up an appointment with Dr. Janit.    Meds ordered this encounter  Medications   norethindrone  (MICRONOR ) 0.35 MG tablet    Sig: Take 1 tablet (0.35 mg total) by mouth daily.    Dispense:  28 tablet    Refill:  11     Terri [name], Student-MidWife 03/07/2024 3:35 PM        GYNECOLOGY OFFICE PROCEDURE NOTE  Terri [name] is a 28 y.o. [id number] here for Liletta  IUD removal. No GYN concerns.  Last pap smear was on over 3 years ago and was normal. Discussed making any appointment for annual exam and pap smear soon.  IUD Removal  Patient identified, informed consent performed, consent signed.  Patient was in the  dorsal lithotomy position, normal external genitalia was noted.  A speculum was placed in the patient's vagina, normal discharge was noted, no lesions. The cervix was visualized, no lesions, no abnormal discharge.  The strings of the IUD were grasped and pulled using ring forceps. The IUD was removed in its entirety. Kyleena .  Patient tolerated the procedure well.    Patient will use POP's for contraception, and schedule for BTL with Dr. Janit.  Routine preventative health maintenance measures emphasized.    Terri [name], Student-MidWife

## 2024-03-08 LAB — HEP, RPR, HIV PANEL
HIV Screen 4th Generation wRfx: NONREACTIVE
Hepatitis B Surface Ag: NEGATIVE
RPR Ser Ql: NONREACTIVE

## 2024-03-08 LAB — HEPATITIS C ANTIBODY: Hep C Virus Ab: NONREACTIVE

## 2024-03-09 LAB — CERVICOVAGINAL ANCILLARY ONLY
Bacterial Vaginitis (gardnerella): NEGATIVE
Candida Glabrata: NEGATIVE
Candida Vaginitis: POSITIVE — AB
Chlamydia: NEGATIVE
Comment: NEGATIVE
Comment: NEGATIVE
Comment: NEGATIVE
Comment: NEGATIVE
Comment: NEGATIVE
Comment: NORMAL
Neisseria Gonorrhea: NEGATIVE
Trichomonas: NEGATIVE

## 2024-03-10 ENCOUNTER — Ambulatory Visit: Payer: Self-pay | Admitting: Licensed Practical Nurse

## 2024-04-12 ENCOUNTER — Encounter: Payer: Self-pay | Admitting: Obstetrics and Gynecology

## 2024-04-12 ENCOUNTER — Ambulatory Visit: Admitting: Obstetrics and Gynecology

## 2024-04-12 VITALS — BP 123/85 | HR 81 | Ht 63.0 in | Wt 250.5 lb

## 2024-04-12 DIAGNOSIS — Z3009 Encounter for other general counseling and advice on contraception: Secondary | ICD-10-CM | POA: Diagnosis not present

## 2024-04-12 NOTE — Progress Notes (Signed)
 Patient presents today to discuss BTL. Medicaid consent signed 03/07/24.

## 2024-04-12 NOTE — Progress Notes (Signed)
 HPI:      [name] is a 28 y.o. [phone] who LMP was Patient's last menstrual period was 04/02/2024 (approximate).  Subjective:   She presents today as a referral from [place] because she was considering permanent sterilization.  She has completed childbearing.  Her history is that she received an IUD in the hospital immediately after her birth 3 years ago.  She had a for 2-1/2 years and was happy with it.  Her periods were light and lasting less than 3 days.  She had a CT scan for another reason and the IUD was found to be rotated.  It was recommended that she have it removed.  After the IUD was removed she began having very heavy painful menstrual bleeding.  She has begun taking Micronor  but would like something more reliable and a long-term form of birth control.    Hx: The following portions of the patient's history were reviewed and updated as appropriate:             She  has a past medical history of Anxiety, No pertinent past medical history, and Pseudotumor cerebri. She does not have any pertinent problems on file. She  has a past surgical history that includes Dilation and evacuation (N/A, 07/13/2017); Cholecystectomy (N/A, 11/04/2018); and ERCP (N/A, 11/14/2018). Her family history includes ADD / ADHD in her son; ALS in her maternal grandfather; Anxiety disorder in her maternal grandmother and mother; Arthritis in her mother; COPD in her maternal grandmother; Depression in her maternal grandmother and mother; Drug abuse in her mother; Suicidality in her mother; Thyroid  disease in her mother. She  reports that she has quit smoking. Her smoking use included cigarettes. She has a 4 pack-year smoking history. She has never used smokeless tobacco. She reports current alcohol use. She reports current drug use. Drugs: Marijuana and Cocaine. She has a current medication list which includes the following prescription(s): acetaminophen , albuterol , budesonide -formoterol , labetalol , and  norethindrone . She is allergic to sulfa  antibiotics and zithromax [azithromycin].       Review of Systems:  Review of Systems  Constitutional: Denied constitutional symptoms, night sweats, recent illness, fatigue, fever, insomnia and weight loss.  Eyes: Denied eye symptoms, eye pain, photophobia, vision change and visual disturbance.  Ears/Nose/Throat/Neck: Denied ear, nose, throat or neck symptoms, hearing loss, nasal discharge, sinus congestion and sore throat.  Cardiovascular: Denied cardiovascular symptoms, arrhythmia, chest pain/pressure, edema, exercise intolerance, orthopnea and palpitations.  Respiratory: Denied pulmonary symptoms, asthma, pleuritic pain, productive sputum, cough, dyspnea and wheezing.  Gastrointestinal: Denied, gastro-esophageal reflux, melena, nausea and vomiting.  Genitourinary: Denied genitourinary symptoms including symptomatic vaginal discharge, pelvic relaxation issues, and urinary complaints.  Musculoskeletal: Denied musculoskeletal symptoms, stiffness, swelling, muscle weakness and myalgia.  Dermatologic: Denied dermatology symptoms, rash and scar.  Neurologic: Denied neurology symptoms, dizziness, headache, neck pain and syncope.  Psychiatric: Denied psychiatric symptoms, anxiety and depression.  Endocrine: Denied endocrine symptoms including hot flashes and night sweats.   Meds:   Current Outpatient Medications on File Prior to Visit  Medication Sig Dispense Refill   acetaminophen  (TYLENOL ) 325 MG tablet Take 650 mg by mouth every 6 (six) hours as needed for moderate pain or headache.     albuterol  (VENTOLIN  HFA) 108 (90 Base) MCG/ACT inhaler Inhale 2 puffs into the lungs every 6 (six) hours as needed for wheezing or shortness of breath. 8 g 2   budesonide -formoterol  (SYMBICORT ) 160-4.5 MCG/ACT inhaler Inhale 2 puffs into the lungs 2 (two) times daily. 1 each 3  labetalol  (NORMODYNE ) 100 MG tablet Take 1 tablet (100 mg total) by mouth 2 (two) times  daily. 60 tablet 1   norethindrone  (MICRONOR ) 0.35 MG tablet Take 1 tablet (0.35 mg total) by mouth daily. 28 tablet 11   No current facility-administered medications on file prior to visit.      Objective:     Vitals:   04/12/24 1428  BP: 123/85  Pulse: 81   Filed Weights   04/12/24 1428  Weight: 250 lb 8 oz (113.6 kg)                        Assessment:    [id number] Patient Active Problem List   Diagnosis Date Noted   Bipolar disorder, in full remission, most recent episode mixed (HCC) 08/13/2023   High risk medication use 05/05/2023   Social anxiety disorder 07/07/2022   Cocaine use disorder, moderate, in sustained remission (HCC) 07/07/2022   Cannabis use disorder, mild, in sustained remission 07/07/2022   Obesity during pregnancy, antepartum 06/24/2021   Late prenatal care affecting pregnancy in third trimester 06/24/2021   Marijuana use during pregnancy 06/24/2021   Left hand pain 10/30/2020   Arthralgia of both knees and bilateral hands.  10/30/2020   Family history of rheumatoid arthritis 10/30/2020   Headache disorder 10/30/2019   Blurred vision 10/16/2019   Supervision of other high risk pregnancy, antepartum 10/05/2019   Polyarthralgia 09/13/2019   Joint stiffness 09/13/2019   GAD (generalized anxiety disorder) 01/16/2019   Depression, major, single episode, severe (HCC) 01/16/2019   PTSD (post-traumatic stress disorder) 01/16/2019   Tobacco use disorder 01/16/2019   Cholestatic jaundice 11/13/2018     1. Encounter for consultation for female sterilization   2. Birth control counseling        Plan:            1.  We have discussed long-term methods of birth control including permanent sterilization.  The risks of surgery, and the failure rate of permanent sterilization, and the menstrual bleeding associated with permanent sterilization were discussed.  She is somewhat concerned because her regular normal.  Is quite heavy and she feels as if she  would need some other cycle control with her tubal.  Because she liked her IUD even though it was malpositioned she would consider having another IUD placed. After long discussion she has decided to have an IUD placed with her next menstrual period.  Should she find over several months that she does not like the IUD she would again consider permanent sterilization. Orders No orders of the defined types were placed in this encounter.   No orders of the defined types were placed in this encounter.     F/U  Return in about 3 weeks (around 05/03/2024).  Alm DOROTHA Sar, M.D. 04/12/2024 2:58 PM

## 2024-04-18 DIAGNOSIS — F411 Generalized anxiety disorder: Secondary | ICD-10-CM | POA: Diagnosis not present

## 2024-04-26 ENCOUNTER — Other Ambulatory Visit: Payer: Self-pay | Admitting: Medical Genetics

## 2024-05-02 ENCOUNTER — Other Ambulatory Visit
Admission: RE | Admit: 2024-05-02 | Discharge: 2024-05-02 | Disposition: A | Discharge status: Home / Self Care | Payer: Self-pay | Source: Ambulatory Visit | Attending: Medical Genetics | Admitting: Medical Genetics

## 2024-05-02 ENCOUNTER — Other Ambulatory Visit: Payer: Self-pay

## 2024-05-02 ENCOUNTER — Encounter: Payer: Self-pay | Admitting: Family Medicine

## 2024-05-02 ENCOUNTER — Ambulatory Visit (INDEPENDENT_AMBULATORY_CARE_PROVIDER_SITE_OTHER): Admitting: Family Medicine

## 2024-05-02 MED ORDER — SEMAGLUTIDE-WEIGHT MANAGEMENT 0.25 MG/0.5ML ~~LOC~~ SOAJ
0.2500 mg | SUBCUTANEOUS | 0 refills | Status: DC
Start: 2024-05-02 — End: 2024-05-02

## 2024-05-02 MED ORDER — SEMAGLUTIDE-WEIGHT MANAGEMENT 0.5 MG/0.5ML ~~LOC~~ SOAJ
0.5000 mg | SUBCUTANEOUS | 1 refills | Status: DC
Start: 2024-05-31 — End: 2024-05-02

## 2024-05-02 MED ORDER — SEMAGLUTIDE-WEIGHT MANAGEMENT 0.25 MG/0.5ML ~~LOC~~ SOAJ
0.2500 mg | SUBCUTANEOUS | 0 refills | Status: AC
  Filled 2024-05-02 – 2024-05-16 (×6): qty 2, 28d supply, fill #0

## 2024-05-02 MED ORDER — SEMAGLUTIDE-WEIGHT MANAGEMENT 0.5 MG/0.5ML ~~LOC~~ SOAJ
0.5000 mg | SUBCUTANEOUS | 1 refills | Status: AC
  Filled 2024-05-02 – 2024-05-31 (×2): qty 2, 28d supply, fill #0

## 2024-05-02 NOTE — Progress Notes (Signed)
 Established patient visit   Patient: Terri [name]   DOB: Jan 18, 1996   28 y.o. Female  MRN: [id number] Visit Date: 05/02/2024  Today's healthcare provider: Jon Eva, MD   Chief Complaint  Patient presents with   Weight Management Screening    Patient is present to discuss weight loss. Labs last completed 03/01/24. She reports she has no set weight loss goal. She is open to speaking with nutrition. She reports walking three times a week for at least a hour and a half.    Subjective    HPI HPI     Weight Management Screening    Additional comments: Patient is present to discuss weight loss. Labs last completed 03/01/24. She reports she has no set weight loss goal. She is open to speaking with nutrition. She reports walking three times a week for at least a hour and a half.       Last edited by Lilian Fitzpatrick, [name] on 05/02/2024  1:41 PM.       Discussed the use of AI scribe software for clinical note transcription with the patient, who gave verbal consent to proceed.  History of Present Illness   Terri [name] is a 28 year old female who presents for weight management.  She experiences difficulty managing her weight due to cravings and challenges in controlling eating habits. Her weight has increased from 250 lbs to 252 lbs. She is working with a Paramedic through a peer support program to address these issues. She previously used Topamax  for weight management before her pregnancy three years ago, which helped maintain her weight stability. She is concerned about potential health risks associated with weight gain, such as heart disease and diabetes. She is not pregnant, not planning to become pregnant, and is on birth control.         Medications: Outpatient Medications Prior to Visit  Medication Sig   acetaminophen  (TYLENOL ) 325 MG tablet Take 650 mg by mouth every 6 (six) hours as needed for moderate pain or headache.   albuterol  (VENTOLIN  HFA) 108  (90 Base) MCG/ACT inhaler Inhale 2 puffs into the lungs every 6 (six) hours as needed for wheezing or shortness of breath.   budesonide -formoterol  (SYMBICORT ) 160-4.5 MCG/ACT inhaler Inhale 2 puffs into the lungs 2 (two) times daily.   labetalol  (NORMODYNE ) 100 MG tablet Take 1 tablet (100 mg total) by mouth 2 (two) times daily.   norethindrone  (MICRONOR ) 0.35 MG tablet Take 1 tablet (0.35 mg total) by mouth daily.   No facility-administered medications prior to visit.    Review of Systems     Objective    BP 134/70 (BP Location: Left Arm, Patient Position: Sitting, Cuff Size: Large)   Pulse 89   Ht 5' 3 (1.6 m)   Wt 252 lb 12.8 oz (114.7 kg)   LMP 04/02/2024 (Approximate)   SpO2 97%   BMI 44.78 kg/m    Physical Exam Vitals reviewed.  Constitutional:      General: She is not in acute distress.    Appearance: Normal appearance. She is well-developed. She is not diaphoretic.  HENT:     Head: Normocephalic and atraumatic.  Eyes:     General: No scleral icterus.    Conjunctiva/sclera: Conjunctivae normal.  Neck:     Thyroid : No thyromegaly.  Cardiovascular:     Rate and Rhythm: Normal rate and regular rhythm.     Heart sounds: Normal heart sounds. No murmur heard. Pulmonary:  Effort: Pulmonary effort is normal. No respiratory distress.     Breath sounds: Normal breath sounds. No wheezing, rhonchi or rales.  Musculoskeletal:     Cervical back: Neck supple.     Right lower leg: No edema.     Left lower leg: No edema.  Lymphadenopathy:     Cervical: No cervical adenopathy.  Skin:    General: Skin is warm and dry.     Findings: No rash.  Neurological:     Mental Status: She is alert and oriented to person, place, and time. Mental status is at baseline.  Psychiatric:        Mood and Affect: Mood normal.        Behavior: Behavior normal.      No results found for any visits on 05/02/24.  Assessment & Plan     Problem List Items Addressed This Visit        Other   Morbid obesity (HCC) - Primary   Morbid obesity. Previous use of Topamax  for weight management. Wegovy  discussed as a treatment option, covered by Medicaid, with superior weight loss outcomes and benefits in preventing heart disease and diabetes. Shared decision to start Wegovy , considering her preference for long-term health and weight management. - Prescribe Wegovy  starting at 0.25 mg weekly for one month, then increase to 0.5 mg weekly for two months. - Educate on Wegovy  administration using self-injector pen. - Advise on dietary fiber intake to prevent constipation. - Instruct to listen to satiety cues to prevent nausea and vomiting. - Use a hard plastic container as a home sharps container for disposal. - Follow up in three months to assess progress and side effects. - Switch pharmacy to hospital pharmacy for medication convenience.      Relevant Medications   semaglutide -weight management (WEGOVY ) 0.5 MG/0.5ML SOAJ SQ injection (Start on 05/31/2024)   semaglutide -weight management (WEGOVY ) 0.25 MG/0.5ML SOAJ SQ injection        Elevated blood pressure Blood pressure initially elevated but decreased to 134/70 upon recheck. Weight gain correlated with increased blood pressure. Anticipated improvement in blood pressure with weight loss.  Follow-Up Scheduled follow-up in January for physical exam and weight management assessment. - Schedule follow-up appointment in January for physical exam and weight management assessment.       Return in about 4 months (around 09/01/2024) for CPE, as scheduled.       Jon Eva, MD  [facility] [phone] (phone) [phone] (fax)  [facility]

## 2024-05-02 NOTE — Assessment & Plan Note (Signed)
 Morbid obesity. Previous use of Topamax  for weight management. Wegovy  discussed as a treatment option, covered by Medicaid, with superior weight loss outcomes and benefits in preventing heart disease and diabetes. Shared decision to start Wegovy , considering her preference for long-term health and weight management. - Prescribe Wegovy  starting at 0.25 mg weekly for one month, then increase to 0.5 mg weekly for two months. - Educate on Wegovy  administration using self-injector pen. - Advise on dietary fiber intake to prevent constipation. - Instruct to listen to satiety cues to prevent nausea and vomiting. - Use a hard plastic container as a home sharps container for disposal. - Follow up in three months to assess progress and side effects. - Switch pharmacy to hospital pharmacy for medication convenience.

## 2024-05-03 ENCOUNTER — Other Ambulatory Visit: Payer: Self-pay

## 2024-05-04 ENCOUNTER — Ambulatory Visit: Admitting: Obstetrics

## 2024-05-05 ENCOUNTER — Other Ambulatory Visit: Payer: Self-pay

## 2024-05-08 ENCOUNTER — Other Ambulatory Visit: Payer: Self-pay

## 2024-05-09 LAB — GENECONNECT MOLECULAR SCREEN: Genetic Analysis Overall Interpretation: NEGATIVE

## 2024-05-10 ENCOUNTER — Other Ambulatory Visit: Payer: Self-pay | Admitting: Family Medicine

## 2024-05-10 ENCOUNTER — Other Ambulatory Visit: Payer: Self-pay

## 2024-05-10 NOTE — Telephone Encounter (Signed)
 FYI Only or Action Required?: Action required by provider: medication refill request.  Patient was last seen in primary care on 05/02/2024 by Myrla Jon HERO, MD.  Called Nurse Triage reporting No chief complaint on file..  Symptoms began today.  Interventions attempted: Nothing.  Symptoms are: stable.  Triage Disposition: No disposition on file.  Patient/caregiver understands and will follow disposition?:

## 2024-05-10 NOTE — Telephone Encounter (Unsigned)
 Copied from CRM [id number]. Topic: Clinical - Medication Refill >> May 10, 2024  2:15 PM Sophia H wrote: Medication: semaglutide -weight management (WEGOVY ) 0.25 MG/0.5ML SOAJ SQ injection  Has the patient contacted their pharmacy? Yes, no fills on file   This is the patient's preferred pharmacy:   [facility] [address] [place] [address] Phone: [phone] Fax: [phone]  Is this the correct pharmacy for this prescription? Yes If no, delete pharmacy and type the correct one.   Has the prescription been filled recently? Yes  Is the patient out of the medication? Yes  Has the patient been seen for an appointment in the last year OR does the patient have an upcoming appointment? Yes, seen 09/02.  Can we respond through MyChart? Yes  Agent: Please be advised that Rx refills may take up to 3 business days. We ask that you follow-up with your pharmacy.

## 2024-05-11 NOTE — Telephone Encounter (Signed)
 Duplicate request, LRF 05/02/24 for 30 days.  Requested Prescriptions  Pending Prescriptions Disp Refills   semaglutide -weight management (WEGOVY ) 0.25 MG/0.5ML SOAJ SQ injection 2 mL 0    Sig: Inject 0.25 mg into the skin once a week for 28 days.     Endocrinology:  Diabetes - GLP-1 Receptor Agonists - semaglutide  Failed - 05/11/2024  2:40 PM      Failed - HBA1C in normal range and within 180 days    Hgb A1c MFr Bld  Date Value Ref Range Status  06/10/2021 5.3 4.8 - 5.6 % Final    Comment:             Prediabetes: 5.7 - 6.4          Diabetes: >6.4          Glycemic control for adults with diabetes: <7.0          Passed - Cr in normal range and within 360 days    Creatinine  Date Value Ref Range Status  09/16/2014 0.60 0.60 - 1.30 mg/dL Final   Creatinine, Ser  Date Value Ref Range Status  03/01/2024 0.53 0.44 - 1.00 mg/dL Final         Passed - Valid encounter within last 6 months    Recent Outpatient Visits           1 week ago Morbid obesity [facility])   [place] [facility] Myrla Jon HERO, MD   2 months ago Left upper quadrant abdominal pain   [place] [facility] [place], PA-C   3 months ago Non-recurrent acute suppurative otitis media of left ear without spontaneous rupture of tympanic membrane   [facility] Gareth Mliss FALCON, FNP       Future Appointments             In 3 months Bacigalupo, Jon HERO, MD [facility], [place]

## 2024-05-12 DIAGNOSIS — F411 Generalized anxiety disorder: Secondary | ICD-10-CM | POA: Diagnosis not present

## 2024-05-12 NOTE — Telephone Encounter (Signed)
 Copied from CRM [id number]. Topic: Clinical - Medication Prior Auth >> May 12, 2024 10:37 AM Terri [name] wrote: Reason for CRM:  semaglutide -weight management (WEGOVY ) 0.25 MG/0.5ML SOAJ SQ injection per pharmacy need PA from insurance

## 2024-05-13 DIAGNOSIS — F411 Generalized anxiety disorder: Secondary | ICD-10-CM | POA: Diagnosis not present

## 2024-05-15 DIAGNOSIS — F411 Generalized anxiety disorder: Secondary | ICD-10-CM | POA: Diagnosis not present

## 2024-05-16 ENCOUNTER — Telehealth: Payer: Self-pay

## 2024-05-16 ENCOUNTER — Other Ambulatory Visit: Payer: Self-pay

## 2024-05-16 ENCOUNTER — Other Ambulatory Visit (HOSPITAL_COMMUNITY): Payer: Self-pay

## 2024-05-16 DIAGNOSIS — F411 Generalized anxiety disorder: Secondary | ICD-10-CM | POA: Diagnosis not present

## 2024-05-16 NOTE — Telephone Encounter (Signed)
 PA request received for Wegovy , advised office as follows:  Unfortunately, medicaid has made it known that criteria changes will be occurring October 1st, which will invalidate any previous approvals/PA attempts. Our team highly recommends waiting until after October 1st to submit any weight loss GLP-1 PA's for medicaid patients if able. Thank you

## 2024-05-16 NOTE — Telephone Encounter (Signed)
 Unfortunately, medicaid has made it known that criteria changes will be occurring October 1st, which will invalidate any previous approvals/PA attempts. Our team highly recommends waiting until after October 1st to submit any weight loss GLP-1 PA's for medicaid patients if able. Thank you

## 2024-05-24 DIAGNOSIS — F411 Generalized anxiety disorder: Secondary | ICD-10-CM | POA: Diagnosis not present

## 2024-05-26 DIAGNOSIS — F411 Generalized anxiety disorder: Secondary | ICD-10-CM | POA: Diagnosis not present

## 2024-05-31 ENCOUNTER — Other Ambulatory Visit: Payer: Self-pay

## 2024-07-14 ENCOUNTER — Encounter: Payer: Self-pay | Admitting: Emergency Medicine

## 2024-07-14 ENCOUNTER — Ambulatory Visit
Admission: EM | Admit: 2024-07-14 | Discharge: 2024-07-14 | Disposition: A | Discharge status: Home / Self Care | Attending: Family Medicine | Admitting: Family Medicine

## 2024-07-14 DIAGNOSIS — S61211A Laceration without foreign body of left index finger without damage to nail, initial encounter: Secondary | ICD-10-CM
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
## 2024-07-14 NOTE — Discharge Instructions (Addendum)
 Keep wound clean and dry.  Do not remove the splint for 24 hours.  The glue/Steri-Strips used to close your wound will dissolve/fall off on their own over the next 5 days.  Do not soak or saturate or scrub the wound.  Monitor for any signs of infection which include but not limited to redness, drainage, swelling, fevers or chills and please seek reevaluation if these occur.  Follow-up with your PCP as needed.

## 2024-08-21 ENCOUNTER — Telehealth: Payer: Self-pay | Admitting: Physician Assistant

## 2024-08-21 DIAGNOSIS — R3989 Other symptoms and signs involving the genitourinary system: Secondary | ICD-10-CM

## 2024-08-21 MED ORDER — CEPHALEXIN 500 MG PO CAPS: 500.0000 mg | ORAL_CAPSULE | Freq: Two times a day (BID) | ORAL | 0 refills | Status: AC

## 2024-08-21 NOTE — Progress Notes (Signed)

## 2024-09-07 ENCOUNTER — Encounter: Payer: Self-pay | Admitting: Family Medicine
